# Patient Record
Sex: Male | Born: 1970
Health system: Southern US, Community
[De-identification: ages and names within clinical notes are randomized; demographics above are authoritative.]

## PROBLEM LIST (undated history)

## (undated) DIAGNOSIS — I10 Essential (primary) hypertension: Secondary | ICD-10-CM

## (undated) DIAGNOSIS — E7800 Pure hypercholesterolemia, unspecified: Secondary | ICD-10-CM

## (undated) DIAGNOSIS — G459 Transient cerebral ischemic attack, unspecified: Secondary | ICD-10-CM

## (undated) DIAGNOSIS — Z8616 Personal history of COVID-19: Secondary | ICD-10-CM

## (undated) DIAGNOSIS — I639 Cerebral infarction, unspecified: Secondary | ICD-10-CM

## (undated) HISTORY — PX: WISDOM TOOTH EXTRACTION: SHX21

## (undated) HISTORY — DX: Essential (primary) hypertension: I10

## (undated) HISTORY — PX: CARDIAC CATHETERIZATION: SHX172

## (undated) HISTORY — DX: Cerebral infarction, unspecified: I63.9

## (undated) HISTORY — PX: EYE SURGERY: SHX253

## (undated) HISTORY — DX: Transient cerebral ischemic attack, unspecified: G45.9

---

## 1998-08-11 ENCOUNTER — Ambulatory Visit (HOSPITAL_COMMUNITY): Admission: RE | Admit: 1998-08-11 | Discharge: 1998-08-11 | Payer: Self-pay | Admitting: Family Medicine

## 1998-08-11 ENCOUNTER — Encounter: Payer: Self-pay | Admitting: Family Medicine

## 2000-07-12 ENCOUNTER — Encounter: Admission: RE | Admit: 2000-07-12 | Discharge: 2000-10-10 | Payer: Self-pay | Admitting: Family Medicine

## 2004-04-06 ENCOUNTER — Ambulatory Visit (HOSPITAL_BASED_OUTPATIENT_CLINIC_OR_DEPARTMENT_OTHER): Admission: RE | Admit: 2004-04-06 | Discharge: 2004-04-06 | Payer: Self-pay | Admitting: Urology

## 2009-05-11 ENCOUNTER — Observation Stay (HOSPITAL_COMMUNITY): Admission: EM | Admit: 2009-05-11 | Discharge: 2009-05-13 | Payer: Self-pay | Admitting: Emergency Medicine

## 2009-05-11 ENCOUNTER — Encounter (INDEPENDENT_AMBULATORY_CARE_PROVIDER_SITE_OTHER): Payer: Self-pay | Admitting: Internal Medicine

## 2009-05-11 ENCOUNTER — Ambulatory Visit: Payer: Self-pay | Admitting: Internal Medicine

## 2010-05-29 LAB — CK TOTAL AND CKMB (NOT AT ARMC)
CK, MB: 0.5 ng/mL (ref 0.3–4.0)
CK, MB: 0.7 ng/mL (ref 0.3–4.0)
Relative Index: 0.6 (ref 0.0–2.5)
Total CK: 116 U/L (ref 7–232)
Total CK: 133 U/L (ref 7–232)
Total CK: 85 U/L (ref 7–232)

## 2010-05-29 LAB — CBC
HCT: 40.3 % (ref 39.0–52.0)
HCT: 42.5 % (ref 39.0–52.0)
HCT: 47.3 % (ref 39.0–52.0)
Hemoglobin: 13.9 g/dL (ref 13.0–17.0)
Hemoglobin: 14.6 g/dL (ref 13.0–17.0)
Hemoglobin: 16.1 g/dL (ref 13.0–17.0)
MCHC: 34 g/dL (ref 30.0–36.0)
MCHC: 34.1 g/dL (ref 30.0–36.0)
MCHC: 34.3 g/dL (ref 30.0–36.0)
MCHC: 34.5 g/dL (ref 30.0–36.0)
MCV: 89.2 fL (ref 78.0–100.0)
MCV: 89.7 fL (ref 78.0–100.0)
MCV: 89.8 fL (ref 78.0–100.0)
MCV: 90.1 fL (ref 78.0–100.0)
Platelets: 183 K/uL (ref 150–400)
Platelets: 194 10*3/uL (ref 150–400)
Platelets: 197 K/uL (ref 150–400)
Platelets: 222 10*3/uL (ref 150–400)
RBC: 4.49 MIL/uL (ref 4.22–5.81)
RBC: 4.76 MIL/uL (ref 4.22–5.81)
RBC: 5.27 MIL/uL (ref 4.22–5.81)
RDW: 13 % (ref 11.5–15.5)
RDW: 13 % (ref 11.5–15.5)
RDW: 13.5 % (ref 11.5–15.5)
RDW: 13.5 % (ref 11.5–15.5)
WBC: 5.3 K/uL (ref 4.0–10.5)
WBC: 6.9 K/uL (ref 4.0–10.5)
WBC: 9.8 10*3/uL (ref 4.0–10.5)

## 2010-05-29 LAB — POCT CARDIAC MARKERS
CKMB, poc: <1 ng/mL — ABNORMAL LOW (ref 1.0–8.0)
Myoglobin, poc: 105 ng/mL (ref 12–200)

## 2010-05-29 LAB — COMPREHENSIVE METABOLIC PANEL
ALT: 24 U/L (ref 0–53)
ALT: 38 U/L (ref 0–53)
AST: 18 U/L (ref 0–37)
Albumin: 3.1 g/dL — ABNORMAL LOW (ref 3.5–5.2)
Alkaline Phosphatase: 60 U/L (ref 39–117)
BUN: 13 mg/dL (ref 6–23)
CO2: 25 mEq/L (ref 19–32)
Calcium: 8.9 mg/dL (ref 8.4–10.5)
Creatinine, Ser: 1.07 mg/dL (ref 0.4–1.5)
GFR calc Af Amer: >60 mL/min (ref >60)
GFR calc non Af Amer: >60 mL/min (ref >60)
Glucose, Bld: 101 mg/dL — ABNORMAL HIGH (ref 70–99)
Glucose, Bld: 112 mg/dL — ABNORMAL HIGH (ref 70–99)
Potassium: 3.9 mEq/L (ref 3.5–5.1)
Sodium: 139 mEq/L (ref 135–145)
Total Protein: 6 g/dL (ref 6.0–8.3)

## 2010-05-29 LAB — POCT I-STAT, CHEM 8
BUN: 16 mg/dL (ref 6–23)
Calcium, Ion: 1.13 mmol/L (ref 1.12–1.32)
Chloride: 105 mEq/L (ref 96–112)
Creatinine, Ser: 1.1 mg/dL (ref 0.4–1.5)
Glucose, Bld: 132 mg/dL — ABNORMAL HIGH (ref 70–99)
HCT: 48 % (ref 39.0–52.0)
Hemoglobin: 16.3 g/dL (ref 13.0–17.0)
Potassium: 4.4 mEq/L (ref 3.5–5.1)
Sodium: 138 mEq/L (ref 135–145)
TCO2: 25 mmol/L (ref 0–100)

## 2010-05-29 LAB — DIFFERENTIAL
Basophils Absolute: 0 10*3/uL (ref 0.0–0.1)
Basophils Relative: 0 % (ref 0–1)
Basophils Relative: 0 % (ref 0–1)
Eosinophils Absolute: 0 10*3/uL (ref 0.0–0.7)
Eosinophils Absolute: 0 10*3/uL (ref 0.0–0.7)
Eosinophils Absolute: 0 10*3/uL (ref 0.0–0.7)
Eosinophils Relative: 0 % (ref 0–5)
Eosinophils Relative: 1 % (ref 0–5)
Lymphocytes Relative: 3 % — ABNORMAL LOW (ref 12–46)
Lymphs Abs: 0.3 10*3/uL — ABNORMAL LOW (ref 0.7–4.0)
Lymphs Abs: 0.6 10*3/uL — ABNORMAL LOW (ref 0.7–4.0)
Lymphs Abs: 0.9 10*3/uL (ref 0.7–4.0)
Monocytes Absolute: 0.4 10*3/uL (ref 0.1–1.0)
Monocytes Absolute: 0.6 10*3/uL (ref 0.1–1.0)
Monocytes Relative: 12 % (ref 3–12)
Monocytes Relative: 4 % (ref 3–12)
Neutro Abs: 9 10*3/uL — ABNORMAL HIGH (ref 1.7–7.7)
Neutrophils Relative %: 69 % (ref 43–77)
Neutrophils Relative %: 85 % — ABNORMAL HIGH (ref 43–77)
Neutrophils Relative %: 92 % — ABNORMAL HIGH (ref 43–77)

## 2010-05-29 LAB — LIPID PANEL
Cholesterol: 233 mg/dL — ABNORMAL HIGH (ref 0–200)
HDL: 35 mg/dL — ABNORMAL LOW (ref >39)
LDL Cholesterol: 191 mg/dL — ABNORMAL HIGH (ref 0–99)
Total CHOL/HDL Ratio: 6.7 ratio
Triglycerides: 36 mg/dL (ref <150)
VLDL: 7 mg/dL (ref 0–40)

## 2010-05-29 LAB — BASIC METABOLIC PANEL WITH GFR
BUN: 8 mg/dL (ref 6–23)
CO2: 27 meq/L (ref 19–32)
Calcium: 9 mg/dL (ref 8.4–10.5)
Chloride: 105 meq/L (ref 96–112)
Creatinine, Ser: 0.97 mg/dL (ref 0.4–1.5)
GFR calc Af Amer: >60 mL/min (ref >60)
GFR calc non Af Amer: >60 mL/min (ref >60)
Glucose, Bld: 116 mg/dL — ABNORMAL HIGH (ref 70–99)
Potassium: 4.3 meq/L (ref 3.5–5.1)
Sodium: 139 meq/L (ref 135–145)

## 2010-05-29 LAB — STOOL CULTURE

## 2010-05-29 LAB — HEPATIC FUNCTION PANEL
ALT: 47 U/L (ref 0–53)
AST: 25 U/L (ref 0–37)
Albumin: 3.9 g/dL (ref 3.5–5.2)

## 2010-05-29 LAB — PHOSPHORUS: Phosphorus: 3.7 mg/dL (ref 2.3–4.6)

## 2010-05-29 LAB — CULTURE, BLOOD (ROUTINE X 2): Culture: NO GROWTH

## 2010-05-29 LAB — TSH: TSH: 0.273 u[IU]/mL — ABNORMAL LOW (ref 0.350–4.500)

## 2010-05-29 LAB — TROPONIN I
Troponin I: 3.74 ng/mL (ref 0.00–0.06)
Troponin I: 3.77 ng/mL (ref 0.00–0.06)
Troponin I: 3.9 ng/mL (ref 0.00–0.06)
Troponin I: 4.01 ng/mL (ref 0.00–0.06)

## 2010-05-29 LAB — MAGNESIUM: Magnesium: 1.8 mg/dL (ref 1.5–2.5)

## 2010-05-29 LAB — HEPARIN LEVEL (UNFRACTIONATED)
Heparin Unfractionated: 0.16 [IU]/mL — ABNORMAL LOW (ref 0.30–0.70)
Heparin Unfractionated: 0.28 [IU]/mL — ABNORMAL LOW (ref 0.30–0.70)
Heparin Unfractionated: 0.46 [IU]/mL (ref 0.30–0.70)
Heparin Unfractionated: <0.1 [IU]/mL — ABNORMAL LOW (ref 0.30–0.70)

## 2010-05-29 LAB — PROTIME-INR
INR: 1.06 (ref 0.00–1.49)
Prothrombin Time: 13.7 s (ref 11.6–15.2)

## 2010-05-29 LAB — GIARDIA/CRYPTOSPORIDIUM SCREEN(EIA)
Cryptosporidium Screen (EIA): NEGATIVE
Giardia Screen - EIA: NEGATIVE

## 2010-05-29 LAB — D-DIMER, QUANTITATIVE: D-Dimer, Quant: 0.5 ug/mL-FEU — ABNORMAL HIGH (ref 0.00–0.48)

## 2010-05-29 LAB — HEMOGLOBIN A1C: Hgb A1c MFr Bld: 5.6 % (ref 4.6–6.1)

## 2010-07-18 NOTE — Assessment & Plan Note (Signed)
[facility]                                 ON-CALL NOTE   NAME:Brad Holmes, Brad Holmes                         MRN:          [id number]  DATE:05/11/2009                            DOB:          21-Jan-1971    TIME:  5:30 a.m.   CHIEF COMPLAINT:  Nausea, vomiting, and chest pain/positive troponin.   PRIMARY CARDIOLOGIST PRIOR:  Meade Maw, MD   HISTORY OF PRESENT ILLNESS:  This patient is a 40 year old male with a  history of hyperlipidemia and a strong family history who presents to  [facility] with chief complaint of nausea, vomiting, diarrhea,  and some associated chest pain.  He notes he has been having  approximately 12 hours of these symptoms from about 3 p.m.  This  afternoon, he began to have a sudden onset of sharp midsternal chest  pain with associated shortness of breath and occasional radiation to his  arm.  Diagnoses were largely episodic, lasting seconds to minutes but  then these were worse with individual deep breaths.  He had no prior  prodrome of exertional chest pain.  Denies any recent symptoms of  congestive heart failure including PND, orthopnea, or dyspnea on  exertion.  He has otherwise been in his usual baseline state of health.  He does note he travels significantly for work and does have a strong  family history for early coronary artery disease.  Based on some EKG  abnormalities, previously he had a nuclear stress test back in 2005.   PAST MEDICAL HISTORY:  Notable for hyperlipidemia, negative nuclear  stress test by Dr. Fraser Din in 2005.   SOCIAL HISTORY:  No tobacco, alcohol, or drug use.  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   FAMILY HISTORY:  Notable for early coronary artery disease.  Father had  a first MI at age 41 and died at age 37.  He has an uncle and brother,  all of them with MIs in their early 109s.   REVIEW OF SYMPTOMS:  As per HPI, full 14-point review of systems is  negative except as noted above in the HPI.   MEDICATIONS:  At home include;  1. Aspirin 81 mg daily.  2. Crestor 40 mg p.o. daily.   ALLERGIES:  No known drug allergies.   PHYSICAL EXAMINATION:  VITAL SIGNS:  In the emergency department were  temperature 97.3, blood pressure 115/79, pulse 103, respiratory rate 22,  sats 100% on room air.  GENERAL:  A well-appearing white male in no acute distress.  HEENT:  Normocephalic, atraumatic.  Normal mucous membranes.  Pupils are  equal, round, reactive to light and accommodation.  Extraocular  movements are intact.  Oropharynx clear.  Mucous membranes are moist.  NECK:  Supple.  No evidence of JVD.  No carotid bruits.  LUNGS:  Clear to auscultation bilaterally.  HEART:  Regular rate and rhythm.  No murmurs, rubs, or gallops.  ABDOMEN:  Soft, nontender, nondistended.  Positive bowel sounds  throughout.  EXTREMITIES:  Without clubbing, cyanosis, or edema; 2+ peripheral  pulses  including dorsalis pedis and posterior tibial, and 2+ radial and femoral  pulses as well.  NEUROLOGIC:  Cranial nerves II-XII are intact.  Strength is 5/5 x4  extremities.  SKIN:  Warm, dry without rash.   LABORATORY DATA:  Laboratory data was reviewed extensively.  CMP reveals  sodium 139, potassium 4.2, chloride 106, bicarb 25, glucose 112,  creatinine is normal at 1.07, BUN is 13.  He has got normal liver  function tests.  CBC reveals a white count of 7.3, hemoglobin of 14.9,  platelet count of 194.  He has got normal differential.  Serial cardiac  markers reveal an initial set at 20:58 on the 8th with CK-MB of less  than 1.0, troponin of less than 0.05, and myoglobin of 105.  Second set  at 3:58 a.m. on March 9 reveals a total CK of 133, MB absolute of 1.0,  and a relative index of 0.8 which are negative; however, he had a  positive troponin at 3.77.  D-dimer checked at the same time revealed  elevation at 0.5.  The EKGs were reviewed.  Initial EKG on the 8th   reveals normal sinus rhythm at a rate of 92 with a normal R-wave  progression.  No evidence of ST or T-wave changes.  Subsequent EKG on  the 9th reveals poor R-wave progression and a nonspecific ST or T-wave  change including depression in the lateral leads and that is not  consistent with ischemia, may reflect lead placement.  His stress test  from several years goes unavailable for my personal review.  Chest x-ray  was reviewed.  Heart size is normal.  No evidence of infiltrate.  Lungs  are clear.   ASSESSMENT/PLAN:  A 40 year old white male with a history of  hyperlipidemia and early family history with nausea, vomiting, and  atypical-type chest pain presenting with a normal CK profile but  elevated troponin, concerning for ACS versus noncardiac source of his  pain.  1. Agree with primary team's plan to continue aspirin, statin, and      plan for a CTA of the chest.  Certainly, a large pulmonary embolus      would fit his historical profile and could cause isolated troponin      elevation.  Certainly, plan to repeat his cardiac enzymes including      CK and troponin.  Continue to monitor him on telemetry.  2. Optimize medical management including addition of low-dose beta-      blocker available, may be limited by blood pressure and pulse.      Continue with his statin as well as an aspirin.  .   Based on the results of his chest CTA and repeat cardiac enzymes, we  will decide at that time whether to plan for repeat perfusion imaging  versus angiography to further define his coronary anatomy.      Vinnie Level, MD    PMB/MedQ  DD: 05/11/2009  DT: 05/12/2009  Job #: [id number]

## 2010-07-21 NOTE — Op Note (Signed)
NAME:  Brad Holmes, DHALIWAL NO.:  [id number]   MEDICAL RECORD NO.:  [id number]          PATIENT TYPE:  AMB   LOCATION:  [facility]                         FACILITY:  [facility]   PHYSICIAN:  Excell Seltzer. Annabell Howells, M.D.    DATE OF BIRTH:  February 18, 1971   DATE OF PROCEDURE:  04/06/2004  DATE OF DISCHARGE:                                 OPERATIVE REPORT   PROCEDURE:  Visual internal urethrotomy.   PREOPERATIVE DIAGNOSIS:  Proximal urethral stricture.   POSTOPERATIVE DIAGNOSIS:  Proximal urethral stricture.   SURGEON:  Bjorn Pippin, M.D.   ANESTHESIA:  General.   DRAINS:  Foley.   COMPLICATIONS:  None.   INDICATIONS:  Tammy Sours is a 40 year old white male who presented with voiding  complaints and was found to have a bulbar urethral stricture. He is to  undergo a visual internal urethrotomy.   FINDINGS OF THE PROCEDURE:  The patient was taken to the operating room  where a general anesthetic was induced. He was given antibiotics  preoperatively. He was placed in the lithotomy position. His perineum and  genitalia were prepped with Betadine solution. He was draped in the usual  sterile fashion. The urethral meatus was dilated to 30-French with Heyman  sounds and a 26-French continuous flow urethrotome was then passed. It was  rather snug at the meatus. Once in position, the stricture was visualized in  the bulbar urethra with a 0-degree lens, and an initial attempt with a half-  moon knife was made to incise the stricture. This would not fit through the  lumen, so a straight blade was then used to initiate the urethrotomy. I then  passed a guidewire through the scope into the bladder and then once again  replaced the half-moon knife and incised the stricture until I could get the  scope through. The stricture was approximately 2 cm distal to the sphincter.  Proximal to the stricture were some areas of whitish fibrous debris that was  densely adherent to the walls of the urethra and appeared  to be some sort of  inflammatory plaque. The scope was advanced into the bladder, inspection  revealed moderate trabeculation, no tumors or stones were noted. Ureteral  orifices were unremarkable. The stricture was then opened at the 6 o'clock  position until a normal-appearing tissue was apparent throughout its length.  The urethrotome was then removed and an 18-French Foley catheter was placed  along side the wire. Once the catheter was in the bladder, the balloon was  filled with 10 mL of sterile fluid, the wire was removed, the catheter was  irrigated. There was initially some blood, but cleared quickly. A little bit  of blood initially appeared along side of the catheter as well, but that  also cleared  quickly. At this point, the catheter was placed to leg bag drainage, the  patient was taken down from lithotomy position, his anesthetic was reversed,  he was moved to the recovery room in stable condition. There were no  complications.      JJW/MEDQ  D:  04/06/2004  T:  04/06/2004  Job:  [id number]   cc:   Carola J. Gerri Spore, M.D.  [address]  [place]  [place] [address]  Fax: [phone]

## 2013-11-25 ENCOUNTER — Other Ambulatory Visit: Payer: Self-pay | Admitting: Family Medicine

## 2013-11-25 DIAGNOSIS — R1084 Generalized abdominal pain: Secondary | ICD-10-CM

## 2013-11-27 ENCOUNTER — Ambulatory Visit
Admission: RE | Admit: 2013-11-27 | Discharge: 2013-11-27 | Disposition: A | Discharge status: Home / Self Care | Payer: No Typology Code available for payment source | Source: Ambulatory Visit | Attending: Family Medicine | Admitting: Family Medicine

## 2013-11-27 ENCOUNTER — Other Ambulatory Visit: Payer: Self-pay | Admitting: Family Medicine

## 2013-11-27 DIAGNOSIS — R1084 Generalized abdominal pain: Secondary | ICD-10-CM

## 2013-12-04 ENCOUNTER — Other Ambulatory Visit: Payer: Self-pay

## 2015-03-06 HISTORY — PX: EYE SURGERY: SHX253

## 2015-06-01 ENCOUNTER — Emergency Department (HOSPITAL_COMMUNITY): Payer: No Typology Code available for payment source

## 2015-06-01 ENCOUNTER — Emergency Department (HOSPITAL_COMMUNITY)
Admission: EM | Admit: 2015-06-01 | Discharge: 2015-06-01 | Disposition: A | Discharge status: Home / Self Care | Payer: No Typology Code available for payment source | Attending: Emergency Medicine | Admitting: Emergency Medicine

## 2015-06-01 ENCOUNTER — Encounter (HOSPITAL_COMMUNITY): Payer: Self-pay | Admitting: Emergency Medicine

## 2015-06-01 DIAGNOSIS — R61 Generalized hyperhidrosis: Secondary | ICD-10-CM | POA: Diagnosis not present

## 2015-06-01 DIAGNOSIS — R0602 Shortness of breath: Secondary | ICD-10-CM | POA: Diagnosis not present

## 2015-06-01 DIAGNOSIS — R079 Chest pain, unspecified: Secondary | ICD-10-CM | POA: Insufficient documentation

## 2015-06-01 DIAGNOSIS — R1110 Vomiting, unspecified: Secondary | ICD-10-CM | POA: Diagnosis not present

## 2015-06-01 HISTORY — DX: Pure hypercholesterolemia, unspecified: E78.00

## 2015-06-01 LAB — BASIC METABOLIC PANEL
Anion gap: 12 (ref 5–15)
BUN: 12 mg/dL (ref 6–20)
CALCIUM: 9.7 mg/dL (ref 8.9–10.3)
CHLORIDE: 103 mmol/L (ref 101–111)
CO2: 24 mmol/L (ref 22–32)
CREATININE: 1.08 mg/dL (ref 0.61–1.24)
GFR calc non Af Amer: >60 mL/min (ref >60)
GLUCOSE: 125 mg/dL — AB (ref 65–99)
Potassium: 4 mmol/L (ref 3.5–5.1)
Sodium: 139 mmol/L (ref 135–145)

## 2015-06-01 LAB — CBC
HCT: 46.7 % (ref 39.0–52.0)
Hemoglobin: 16.3 g/dL (ref 13.0–17.0)
MCH: 30.7 pg (ref 26.0–34.0)
MCHC: 34.9 g/dL (ref 30.0–36.0)
MCV: 87.9 fL (ref 78.0–100.0)
PLATELETS: 264 10*3/uL (ref 150–400)
RBC: 5.31 MIL/uL (ref 4.22–5.81)
RDW: 13.4 % (ref 11.5–15.5)
WBC: 14.8 10*3/uL — ABNORMAL HIGH (ref 4.0–10.5)

## 2015-06-01 LAB — I-STAT TROPONIN, ED: TROPONIN I, POC: 0 ng/mL (ref 0.00–0.08)

## 2015-06-01 MED ORDER — ONDANSETRON 4 MG PO TBDP
8.0000 mg | ORAL_TABLET | Freq: Once | ORAL | Status: AC
  Administered 2015-06-01: 8 mg via ORAL

## 2015-06-01 MED ORDER — ONDANSETRON 4 MG PO TBDP
ORAL_TABLET | ORAL | Status: AC
  Filled 2015-06-01: qty 1

## 2015-06-01 NOTE — ED Notes (Signed)
Pt. reports central chest pain onset this evening with emesis , SOB and diaphoresis .

## 2015-06-01 NOTE — ED Notes (Addendum)
Pt stated that he is feeling better and wishes to leave. Advised patient to return immediately if his symptoms returned. Pt ambulated out of the waiting area.

## 2016-04-26 ENCOUNTER — Ambulatory Visit (INDEPENDENT_AMBULATORY_CARE_PROVIDER_SITE_OTHER): Payer: 59 | Admitting: Podiatry

## 2016-04-26 VITALS — Resp 16 | Ht 71.0 in | Wt 197.0 lb

## 2016-04-26 DIAGNOSIS — B351 Tinea unguium: Secondary | ICD-10-CM

## 2016-04-26 LAB — HEPATIC FUNCTION PANEL
ALBUMIN: 4.3 g/dL (ref 3.6–5.1)
ALK PHOS: 72 U/L (ref 40–115)
ALT: 41 U/L (ref 9–46)
AST: 19 U/L (ref 10–40)
BILIRUBIN DIRECT: 0.1 mg/dL (ref <=0.2)
BILIRUBIN TOTAL: 0.5 mg/dL (ref 0.2–1.2)
Indirect Bilirubin: 0.4 mg/dL (ref 0.2–1.2)
Total Protein: 7.1 g/dL (ref 6.1–8.1)

## 2016-04-26 MED ORDER — TERBINAFINE HCL 250 MG PO TABS: 250.0000 mg | ORAL_TABLET | Freq: Every day | ORAL | 0 refills | Status: DC

## 2016-04-26 NOTE — Progress Notes (Signed)
   Subjective:    Patient ID: Brad Holmes, male    DOB: [birth year], 46 y.o.   MRN: [id number]  HPI  Chief Complaint  Patient presents with  . Nail Problem    Right; Great toe; Discolored/Yellow x 1 year. Pt stated that he injured the toe nearly 1 year ago and the nail grew back abnormal and yellow. Pt's PCP prescribed Lamisil and he did not take the medication due to side effects.        Review of Systems     Objective:   Physical Exam        Assessment & Plan:

## 2016-04-27 NOTE — Progress Notes (Signed)
Subjective:     Patient ID: Brad Holmes, male   DOB: 03-28-1970, 46 y.o.   MRN: [id number]  HPI patient presents with damaged right hallux nail that's thickened and incurvated and makes it hard for him to cut. Thinks that he traumatized and a year ago but he does have history of fungus   Review of Systems  All other systems reviewed and are negative.      Objective:   Physical Exam  Constitutional: He is oriented to person, place, and time.  Cardiovascular: Intact distal pulses.   Musculoskeletal: Normal range of motion.  Neurological: He is oriented to person, place, and time.  Skin: Skin is warm.  Nursing note and vitals reviewed.  neurovascular status intact muscle strength was adequate range of motion within normal limits with patient found to have a yellow right hallux nail and slight discoloration of adjacent nails. It is localized with no other pathology within the nails but does have some skin irritation indicating probable fungal infection of a light nature. Patient has good digital perfusion and is well oriented 3     Assessment:     I believe a combination of mycotic infection with probable trauma along with probable systemic element to the condition    Plan:     H&P conditions reviewed and at this point we'll start with oral Lamisil which I educated him on and he will get liver function study along with laser therapy and topical medication. Patient be seen back for laser

## 2016-04-30 ENCOUNTER — Ambulatory Visit (INDEPENDENT_AMBULATORY_CARE_PROVIDER_SITE_OTHER): Payer: Self-pay

## 2016-04-30 DIAGNOSIS — B351 Tinea unguium: Secondary | ICD-10-CM

## 2016-05-04 NOTE — Progress Notes (Signed)
Pt presents with mycotic infection of nail Rt hallux All other systems are negative  Laser therapy administered to affected nails and tolerated well. All safety precautions were in place. Re-appointed in 1 month for 2nd of 3 treatments

## 2016-05-28 ENCOUNTER — Other Ambulatory Visit: Payer: 59

## 2016-06-05 ENCOUNTER — Ambulatory Visit (INDEPENDENT_AMBULATORY_CARE_PROVIDER_SITE_OTHER): Payer: 59 | Admitting: Podiatry

## 2016-06-05 DIAGNOSIS — B351 Tinea unguium: Secondary | ICD-10-CM

## 2016-06-07 NOTE — Progress Notes (Signed)
Pt presents with mycotic infection of nail Rt hallux All other systems are negative  Laser therapy administered to affected nails and tolerated well. All safety precautions were in place. Re-appointed prn

## 2016-07-09 ENCOUNTER — Other Ambulatory Visit: Payer: Self-pay

## 2016-07-13 ENCOUNTER — Other Ambulatory Visit: Payer: Self-pay

## 2016-08-27 ENCOUNTER — Ambulatory Visit (INDEPENDENT_AMBULATORY_CARE_PROVIDER_SITE_OTHER): Payer: Self-pay

## 2016-08-27 ENCOUNTER — Ambulatory Visit (INDEPENDENT_AMBULATORY_CARE_PROVIDER_SITE_OTHER): Payer: Self-pay | Admitting: Physician Assistant

## 2016-08-27 DIAGNOSIS — S82892A Other fracture of left lower leg, initial encounter for closed fracture: Secondary | ICD-10-CM

## 2016-08-27 MED ORDER — OXYCODONE-ACETAMINOPHEN 5-325 MG PO TABS: 1.0000 | ORAL_TABLET | Freq: Four times a day (QID) | ORAL | 0 refills | Status: DC | PRN

## 2016-08-27 NOTE — Progress Notes (Signed)
Office Visit Note   Patient: Brad Holmes           Date of Birth: 26-Nov-1970           MRN: [id number] Visit Date: 08/27/2016              Requested by: No referring provider defined for this encounter. PCP: No primary care provider on file.   Assessment & Plan: Visit Diagnoses:  1. Closed fracture of left ankle, initial encounter     Plan: He is given a prescription for Percocet. Would like for him to take him Tylenol for pain in the day as much as he can. Discussed with him that he take no more than 3g Tylenol day. No NSAIDs at this point in time recommend ice to the ankle. Place him on aspirin 325 mg once daily. He is touchdown weightbearing on the ankle in the [employer]. He can come out of boot boot for hygiene and comfort while sitting. Elevation wiggling toes encouraged. Follow with [place] in 2 weeks for 3 views of the ankle.  Follow-Up Instructions: Return in about 2 weeks (around 09/10/2016).   Orders:  Orders Placed This Encounter  Procedures  . XR Ankle Complete Left  . XR Tibia/Fibula Left   Meds ordered this encounter  Medications  . oxyCODONE-acetaminophen (PERCOCET/ROXICET) 5-325 MG tablet    Sig: Take 1-2 tablets by mouth every 6 (six) hours as needed for severe pain.    Dispense:  40 tablet    Refill:  0      Procedures: No procedures performed   Clinical Data: No additional findings.   Subjective: Chief Complaint  Patient presents with  . Left Ankle - Injury    HPI  46 year old male who is seen for a left ankle fracture. He was on vacation and jumped off of a boat onto a pilon sustaining an injury to the left ankle. He saw an orthopedist in [place] and was told he had a Weber B fracture. Fracture is now 1 week out. He's been in a [place] dressing splint. He did not bring any films with him today. Does travel a lot for work and is due to travel to [place], has some concerns about possible blood clot. He does not smoke. He's had no history of  previous clot. Denies chest pain shortness breath fevers chills.  Review of Systems Please see history of present illness otherwise negative  Objective: Vital Signs: There were no vitals taken for this visit.  Physical Exam  Constitutional: He is oriented to person, place, and time. He appears well-developed and well-nourished. No distress.  Cardiovascular: Intact distal pulses.   Pulmonary/Chest: Effort normal.  Neurological: He is alert and oriented to person, place, and time.  Skin: He is not diaphoretic.  Psychiatric: He has a normal mood and affect. His behavior is normal.    Ortho Exam Left foot has swelling globally about the ankle and foot. Significant ecchymosis lateral aspect of the ankle and down into the mid foot region. Tenderness over the lateral malleolus. Slight tenderness over the deltoid ligament. Left calf supple nontender. Tenderness over the proximal fibula. Specialty Comments:  No specialty comments available.  Imaging: Xr Ankle Complete Left  Result Date: 08/27/2016 Left ankle 3 views: Talus well located within the ankle mortise. Comminuted minimally displaced lateral malleolus fracture Weber B type. No other fractures identified.  Xr Tibia/fibula Left  Result Date: 08/27/2016 AP lateral views proximal tibia: No acute fracture proximally. The knee  joint well maintained.    PMFS History: Patient Active Problem List   Diagnosis Date Noted  . Closed fracture of left ankle 08/27/2016   Past Medical History:  Diagnosis Date  . Hypercholesterolemia     No family history on file.  No past surgical history on file. Social History   Occupational History  . Not on file.   Social History Main Topics  . Smoking status: Never Smoker  . Smokeless tobacco: Not on file  . Alcohol use No  . Drug use: No  . Sexual activity: Not on file

## 2016-09-12 ENCOUNTER — Ambulatory Visit (INDEPENDENT_AMBULATORY_CARE_PROVIDER_SITE_OTHER): Payer: Self-pay | Admitting: Physician Assistant

## 2016-09-12 ENCOUNTER — Ambulatory Visit (INDEPENDENT_AMBULATORY_CARE_PROVIDER_SITE_OTHER): Payer: Self-pay

## 2016-09-12 DIAGNOSIS — S82892D Other fracture of left lower leg, subsequent encounter for closed fracture with routine healing: Secondary | ICD-10-CM

## 2016-09-12 MED ORDER — DICLOFENAC SODIUM 1 % TD GEL
2.0000 g | Freq: Three times a day (TID) | TRANSDERMAL | 0 refills | Status: DC
Start: 2016-09-12 — End: 2018-08-11

## 2016-09-12 NOTE — Progress Notes (Signed)
   Office Visit Note   Patient: Brad Holmes           Date of Birth: Oct 29, 1970           MRN: [id number] Visit Date: 09/12/2016              Requested by: No referring provider defined for this encounter. PCP: No primary care provider on file.   Assessment & Plan: Visit Diagnoses:  1. Closed fracture of left ankle with routine healing, subsequent encounter     Plan: Brad Holmes will remain in a cam walker boot for the next 2 weeks. He is weightbearing as tolerated in a cam walker boot. He'll then transition to an ASO brace to wear this at all times for 1 week and then will wear it when out of the house for a week and wean out of the brace. Achilles stretching exercises shown today. He will begin using Voltaren gel 3 times daily in the quantity of 2 g to the Achilles.9 /16 of an inch heel lift in his CAM Walker boot/ left shoe Follow-up with [place] in 1 month no x-rays at that time unless clinically indicated.  Follow-Up Instructions: Return in about 4 weeks (around 10/10/2016).   Orders:  Orders Placed This Encounter  Procedures  . XR Ankle Complete Left   Meds ordered this encounter  Medications  . diclofenac sodium (VOLTAREN) 1 % GEL    Sig: Apply 2 g topically 3 (three) times daily.    Dispense:  3 Tube    Refill:  0      Procedures: No procedures performed   Clinical Data: No additional findings.   Subjective: Left ankle fracture  HPI Brad Holmes returns today follow-up of his left ankle lateral malleolus fracture. He is now 5 weeks status post injury. He's developed some tenderness in his Achilles since he was last seen. He is having no Pain chest pain or shortness breath. Review of Systems No chest pain shortness breath. Positive for lateral left ankle pain and left Achilles pain  Objective: Vital Signs: There were no vitals taken for this visit.  Physical Exam  Ortho Exam Left ankle slight edema. Ecchymosis posterior calcaneal area. Tenderness over the lateral  malleolus only. Tenderness also over the Achilles Thompson test negative. Calf supple nontender. 5 out of 5 strength with inversion /eversion against resistance. Nontender over the posterior tibial tendon and peroneal tendons.  Specialty Comments:  No specialty comments available.  Imaging: Xr Ankle Complete Left  Result Date: 09/12/2016 3 views left ankle: Talus well located within the ankle mortise no diastases. The Weber B type lateral malleolus fracture remains in overall good position alignment. Early signs of callus formation.    PMFS History: Patient Active Problem List   Diagnosis Date Noted  . Closed fracture of left ankle 08/27/2016   Past Medical History:  Diagnosis Date  . Hypercholesterolemia     No family history on file.  No past surgical history on file. Social History   Occupational History  . Not on file.   Social History Main Topics  . Smoking status: Never Smoker  . Smokeless tobacco: Not on file  . Alcohol use No  . Drug use: No  . Sexual activity: Not on file

## 2016-10-09 ENCOUNTER — Ambulatory Visit (INDEPENDENT_AMBULATORY_CARE_PROVIDER_SITE_OTHER): Payer: Self-pay | Admitting: Physician Assistant

## 2016-10-09 ENCOUNTER — Ambulatory Visit (INDEPENDENT_AMBULATORY_CARE_PROVIDER_SITE_OTHER): Payer: Self-pay

## 2016-10-09 DIAGNOSIS — G8929 Other chronic pain: Secondary | ICD-10-CM | POA: Insufficient documentation

## 2016-10-09 DIAGNOSIS — M25572 Pain in left ankle and joints of left foot: Secondary | ICD-10-CM

## 2016-10-09 NOTE — Progress Notes (Signed)
   Office Visit Note   Patient: Brad Holmes           Date of Birth: [birth year]           MRN: [id number] Visit Date: 10/09/2016              Requested by: No referring provider defined for this encounter. PCP: Brad Pih, MD   Assessment & Plan: Visit Diagnoses:  1. Pain in left ankle and joints of left foot     Plan: He will discontinue the ASO brace. I advised him to wear a high shoe to help protect his ankle. Something like a boot or high top sneaker. He is given a prescription for physical therapy to work on range of motion strengthening ankle. See him back in 4 weeks to check his progress. Have discussed with him that he has soreness and swelling in the ankle for up to 6 months to a year.  Follow-Up Instructions: Return in about 4 weeks (around 11/06/2016).   Orders:  Orders Placed This Encounter  Procedures  . XR Ankle Complete Left   No orders of the defined types were placed in this encounter.     Procedures: No procedures performed   Clinical Data: No additional findings.   Subjective: No chief complaint on file.   HPI Brad Holmes returns today for follow-up of his left ankle lateral malleolus fracture. States overall is doing where well. He wears the ASO at times but finds that this actually makes pain worse. He does have swelling from time to time. He has essentially generalized tenderness and stiffness in the ankle that he feels consistent with tendinitis. He states the ankle feels like it needs to pop. He is having no true mechanical symptoms of the ankle. Review of Systems   Objective: Vital Signs: There were no vitals taken for this visit.  Physical Exam  Constitutional: He is oriented to person, place, and time. He appears well-developed and well-nourished. No distress.  Cardiovascular: Intact distal pulses.   Neurological: He is alert and oriented to person, place, and time.  Psychiatric: He has a normal mood and affect. His behavior is  normal.    Ortho Exam at the ankle he has good plantarflexion and dorsiflex neutral. As stiffness with inversion eversion but 5 out of 5 strengths against resistance with inversion eversion. Tenderness over the Achilles tendon. Achilles intact. Slight tenderness over the posterior tibial tendon and peroneal tendon near the brevis insertion. Mild tenderness over the lateral malleolus. No tenderness over the deltoid ligament.  Specialty Comments:  No specialty comments available.  Imaging: Xr Ankle Complete Left  Result Date: 10/09/2016 Left ankle 3 views. Further consolidation of the lateral malleolus fracture. No change in overall position alignment. Talus remains well located within the ankle mortise without diastases.    PMFS History: Patient Active Problem List   Diagnosis Date Noted  . Pain in left ankle and joints of left foot 10/09/2016  . Closed fracture of left ankle 08/27/2016   Past Medical History:  Diagnosis Date  . Hypercholesterolemia     No family history on file.  No past surgical history on file. Social History   Occupational History  . Not on file.   Social History Main Topics  . Smoking status: Never Smoker  . Smokeless tobacco: Not on file  . Alcohol use No  . Drug use: No  . Sexual activity: Not on file

## 2016-11-12 ENCOUNTER — Ambulatory Visit (INDEPENDENT_AMBULATORY_CARE_PROVIDER_SITE_OTHER): Payer: Self-pay | Admitting: Physician Assistant

## 2018-02-04 ENCOUNTER — Ambulatory Visit: Payer: Self-pay | Admitting: Family Medicine

## 2018-02-04 DIAGNOSIS — Z23 Encounter for immunization: Secondary | ICD-10-CM

## 2018-02-04 NOTE — Progress Notes (Signed)
Pt presents here today for visit to receive influenza vaccine. Allergies reviewed, vaccine given, vaccine information statement provided, tolerated well.

## 2018-02-04 NOTE — Patient Instructions (Signed)
VIS FORM WAS GIVEN LEFT ARM.

## 2018-02-10 ENCOUNTER — Ambulatory Visit (INDEPENDENT_AMBULATORY_CARE_PROVIDER_SITE_OTHER): Payer: 59 | Admitting: Podiatry

## 2018-02-10 ENCOUNTER — Encounter: Payer: Self-pay | Admitting: Podiatry

## 2018-02-10 DIAGNOSIS — B351 Tinea unguium: Secondary | ICD-10-CM

## 2018-02-10 DIAGNOSIS — L821 Other seborrheic keratosis: Secondary | ICD-10-CM | POA: Diagnosis not present

## 2018-02-10 DIAGNOSIS — D225 Melanocytic nevi of trunk: Secondary | ICD-10-CM | POA: Diagnosis not present

## 2018-02-10 DIAGNOSIS — A630 Anogenital (venereal) warts: Secondary | ICD-10-CM | POA: Diagnosis not present

## 2018-02-10 DIAGNOSIS — L814 Other melanin hyperpigmentation: Secondary | ICD-10-CM | POA: Diagnosis not present

## 2018-02-10 LAB — HEPATIC FUNCTION PANEL
AG Ratio: 1.8 (calc) (ref 1.0–2.5)
ALKALINE PHOSPHATASE (APISO): 76 U/L (ref 40–115)
ALT: 35 U/L (ref 9–46)
AST: 21 U/L (ref 10–40)
Albumin: 4.4 g/dL (ref 3.6–5.1)
Bilirubin, Direct: 0.1 mg/dL (ref 0.0–0.2)
Globulin: 2.5 g/dL (calc) (ref 1.9–3.7)
Indirect Bilirubin: 0.4 mg/dL (calc) (ref 0.2–1.2)
Total Bilirubin: 0.5 mg/dL (ref 0.2–1.2)
Total Protein: 6.9 g/dL (ref 6.1–8.1)

## 2018-02-10 MED ORDER — TERBINAFINE HCL 250 MG PO TABS: 250.0000 mg | ORAL_TABLET | Freq: Every day | ORAL | 0 refills | Status: DC

## 2018-02-12 NOTE — Progress Notes (Signed)
Subjective:   Patient ID: Brad Holmes, male   DOB: 47 y.o.   MRN: [id number]   HPI Patient presents stating his had discoloration of his big toenails bilateral and that he had laser several years ago which was relatively effective and he has all nails that are thickened currently and irritation also between his toes.  Patient does not smoke likes to be active   Review of Systems  All other systems reviewed and are negative.       Objective:  Physical Exam  Constitutional: He appears well-developed and well-nourished.  Cardiovascular: Intact distal pulses.  Pulmonary/Chest: Effort normal.  Musculoskeletal: Normal range of motion.  Neurological: He is alert.  Skin: Skin is warm.  Nursing note and vitals reviewed.   Neurovascular status intact muscle strength is adequate range of motion was within normal limits with change in color to the nailbeds with moderate thickness noted of the beds localized in nature with patient having also interdigital irritation between toes 2334 bilateral.  Has good digital perfusion well oriented x3     Assessment:  Mycotic nail infection mostly affecting the hallux bilateral with slight involvement of the other nails with also interdigital fungal infection     Plan:  H&P conditions reviewed discussed treatment options he would like to try to get rid of this.  I explained it may not ever completely get rid of it for you to try oral Lamisil and we will get liver function studies and laser therapy x3 or 4 treatments.  Patient is scheduled with Janett Billow for laser and will begin Lamisil 250 mg daily after I reviewed blood work.  Patient is encouraged to call with any questions concerns

## 2018-02-13 ENCOUNTER — Ambulatory Visit: Payer: Self-pay

## 2018-02-13 ENCOUNTER — Other Ambulatory Visit: Payer: 59

## 2018-02-13 DIAGNOSIS — B351 Tinea unguium: Secondary | ICD-10-CM

## 2018-02-13 NOTE — Progress Notes (Signed)
Pt presents with mycotic infection of nails 1-5 bilateral.  All other systems are negative  Laser therapy administered to affected nails and tolerated well. All safety precautions were in place.  2nd treatment.  Follow up in 4 weeks     

## 2018-02-14 ENCOUNTER — Emergency Department (HOSPITAL_BASED_OUTPATIENT_CLINIC_OR_DEPARTMENT_OTHER)
Admission: EM | Admit: 2018-02-14 | Discharge: 2018-02-15 | Disposition: A | Discharge status: Home / Self Care | Payer: 59 | Attending: Emergency Medicine | Admitting: Emergency Medicine

## 2018-02-14 ENCOUNTER — Other Ambulatory Visit: Payer: Self-pay

## 2018-02-14 ENCOUNTER — Emergency Department (HOSPITAL_BASED_OUTPATIENT_CLINIC_OR_DEPARTMENT_OTHER): Payer: 59

## 2018-02-14 DIAGNOSIS — S3992XA Unspecified injury of lower back, initial encounter: Secondary | ICD-10-CM | POA: Diagnosis not present

## 2018-02-14 DIAGNOSIS — W19XXXA Unspecified fall, initial encounter: Secondary | ICD-10-CM

## 2018-02-14 DIAGNOSIS — M542 Cervicalgia: Secondary | ICD-10-CM | POA: Diagnosis not present

## 2018-02-14 DIAGNOSIS — M545 Low back pain, unspecified: Secondary | ICD-10-CM

## 2018-02-14 DIAGNOSIS — M25522 Pain in left elbow: Secondary | ICD-10-CM | POA: Diagnosis not present

## 2018-02-14 DIAGNOSIS — W108XXA Fall (on) (from) other stairs and steps, initial encounter: Secondary | ICD-10-CM | POA: Diagnosis not present

## 2018-02-14 DIAGNOSIS — S199XXA Unspecified injury of neck, initial encounter: Secondary | ICD-10-CM | POA: Diagnosis not present

## 2018-02-14 LAB — CBC
HEMATOCRIT: 42.8 % (ref 39.0–52.0)
HEMOGLOBIN: 13.9 g/dL (ref 13.0–17.0)
MCH: 29 pg (ref 26.0–34.0)
MCHC: 32.5 g/dL (ref 30.0–36.0)
MCV: 89.4 fL (ref 80.0–100.0)
Platelets: 257 10*3/uL (ref 150–400)
RBC: 4.79 MIL/uL (ref 4.22–5.81)
RDW: 12.6 % (ref 11.5–15.5)
WBC: 8.2 10*3/uL (ref 4.0–10.5)
nRBC: 0 % (ref 0.0–0.2)

## 2018-02-14 LAB — BASIC METABOLIC PANEL
Anion gap: 6 (ref 5–15)
BUN: 19 mg/dL (ref 6–20)
CO2: 26 mmol/L (ref 22–32)
Calcium: 9 mg/dL (ref 8.9–10.3)
Chloride: 104 mmol/L (ref 98–111)
Creatinine, Ser: 0.96 mg/dL (ref 0.61–1.24)
GFR calc Af Amer: >60 mL/min (ref >60)
GFR calc non Af Amer: >60 mL/min (ref >60)
Glucose, Bld: 104 mg/dL — ABNORMAL HIGH (ref 70–99)
Potassium: 3.7 mmol/L (ref 3.5–5.1)
Sodium: 136 mmol/L (ref 135–145)

## 2018-02-14 NOTE — ED Triage Notes (Signed)
Pt states he fell backwards on some stairs, was having neck pain and tingling and numbness on his left arm. Pt was seen on UC and sent here with copies of x ray for possible need of CT scan or MRI.

## 2018-02-15 DIAGNOSIS — M542 Cervicalgia: Secondary | ICD-10-CM | POA: Diagnosis not present

## 2018-02-15 DIAGNOSIS — M545 Low back pain: Secondary | ICD-10-CM | POA: Diagnosis not present

## 2018-02-15 MED ORDER — METHOCARBAMOL 500 MG PO TABS: 500.0000 mg | ORAL_TABLET | Freq: Three times a day (TID) | ORAL | 0 refills | Status: DC | PRN

## 2018-02-15 MED ORDER — OXYCODONE HCL 5 MG PO TABS: 5.0000 mg | ORAL_TABLET | ORAL | 0 refills | Status: DC | PRN

## 2018-02-15 NOTE — ED Provider Notes (Addendum)
[facility] Emergency Department Provider Note MRN:  [id number]  Arrival date & time: 02/15/18     Chief Complaint   Fall   History of Present Illness   Brad Holmes is a 47 y.o. year-old male with a history of hypercholesterolemia presenting to the ED with chief complaint of fall.  Patient was standing on some brick steps that were wet with rain, lost his balance, slipped, fell up the stairs, landing on his neck and lower back.  Patient felt immediate pain to the neck and lower back, moderate, sharp.  Noticed numbness to his left foot as well as ring finger and pinky finger of the left hand.  The foot numbness resolved but the numbness to the fingers has continued.  Was evaluated at an urgent care with x-rays that revealed a questionable fracture of the C6 vertebra.  Sent here for CT versus MRI imaging.  Review of Systems  A complete 10 system review of systems was obtained and all systems are negative except as noted in the HPI and PMH.   Patient's Health History    Past Medical History:  Diagnosis Date  . Hypercholesterolemia     No past surgical history on file.  No family history on file.  Social History   Socioeconomic History  . Marital status: Married    Spouse name: Not on file  . Number of children: Not on file  . Years of education: Not on file  . Highest education level: Not on file  Occupational History  . Not on file  Social Needs  . Financial resource strain: Not on file  . Food insecurity:    Worry: Not on file    Inability: Not on file  . Transportation needs:    Medical: Not on file    Non-medical: Not on file  Tobacco Use  . Smoking status: Never Smoker  . Smokeless tobacco: Never Used  Substance and Sexual Activity  . Alcohol use: No  . Drug use: No  . Sexual activity: Not on file  Lifestyle  . Physical activity:    Days per week: Not on file    Minutes per session: Not on file  . Stress: Not on file    Relationships  . Social connections:    Talks on phone: Not on file    Gets together: Not on file    Attends religious service: Not on file    Active member of club or organization: Not on file    Attends meetings of clubs or organizations: Not on file    Relationship status: Not on file  . Intimate partner violence:    Fear of current or ex partner: Not on file    Emotionally abused: Not on file    Physically abused: Not on file    Forced sexual activity: Not on file  Other Topics Concern  . Not on file  Social History Narrative  . Not on file     Physical Exam  Vital Signs and Nursing Notes reviewed Vitals:   02/14/18 2157 02/14/18 2337  BP: (!) 150/91 (!) 146/84  Pulse: 78 79  Resp: 18 18  Temp: 98.3 F (36.8 C)   SpO2: 97% 98%    CONSTITUTIONAL: Well-appearing, NAD NEURO:  Alert and oriented x 3, decreased sensation to the left ring and pinky fingers, as well as the ulnar aspect of the left hand EYES:  eyes equal and reactive ENT/NECK:  no LAD, no JVD CARDIO: Regular  rate, well-perfused, normal S1 and S2 PULM:  CTAB no wheezing or rhonchi GI/GU:  normal bowel sounds, non-distended, non-tender MSK/SPINE:  No gross deformities, no edema SKIN:  no rash, atraumatic PSYCH:  Appropriate speech and behavior  Diagnostic and Interventional Summary    Labs Reviewed  BASIC METABOLIC PANEL - Abnormal; Notable for the following components:      Result Value   Glucose, Bld 104 (*)    All other components within normal limits  CBC    CT Cervical Spine Wo Contrast  Final Result    CT Lumbar Spine Wo Contrast  Final Result      Medications - No data to display   Procedures Critical Care  ED Course and Medical Decision Making  I have reviewed the triage vital signs and the nursing notes.  Pertinent labs & imaging results that were available during my care of the patient were reviewed by me and considered in my medical decision making (see below for  details).  CTs reveal no acute fracture, however patient's neurological deficit in the hand persists.  Will discuss with neurosurgery the need for MRI, as this would require transfer.  Unable to connect with neurosurgery over the phone.  Discussed this with patient, who continues to feel better, thinks that the numbness in the fingers is improving.  Still, transfer to [facility] for [facility] imaging tonight was offered.  Patient defers this offer at this time.  Patient then explains that he has a follow-up appointment with an orthopedic surgeon this coming Monday and is scheduled for a cervical spine MRI this coming Thursday.  This seems to be an appropriate follow-up giving the symptoms currently.  Strict return precautions provided.  After the discussed management above, the patient was determined to be safe for discharge.  The patient was in agreement with this plan and all questions regarding their care were answered.  ED return precautions were discussed and the patient will return to the ED with any significant worsening of condition.  Barth Kirks. Sedonia Small, MD [place] [email or web address]  Final Clinical Impressions(s) / ED Diagnoses     ICD-10-CM   1. Fall, initial encounter W19.XXXA   2. Neck pain M54.2   3. Acute midline low back pain without sciatica M54.5     ED Discharge Orders         Ordered    oxyCODONE (ROXICODONE) 5 MG immediate release tablet  Every 4 hours PRN     02/15/18 0100             Maudie Flakes, MD 02/15/18 0103    Maudie Flakes, MD 02/15/18 [phone]

## 2018-02-15 NOTE — ED Notes (Signed)
Contacted Dr. Zada Finders for second time for consult

## 2018-02-15 NOTE — ED Notes (Signed)
Contacted Dr. Zada Finders again for consult

## 2018-02-15 NOTE — Discharge Instructions (Addendum)
You were evaluated in the Emergency Department and after careful evaluation, we did not find any emergent condition requiring admission or further testing in the hospital.  Your symptoms today seem to be due to bruising and strain from the fall.  There is still a possibility that you have nerve or ligament injury in your neck given that you have some decreased sensation to your fingers.  It is very important that you wear the collar provided at all times until you see your orthopedic doctor on Monday.  Please return to the Emergency Department if you experience any worsening of your condition.  We encourage you to follow up with a primary care provider.  Thank you for allowing [place] to be a part of your care.

## 2018-02-15 NOTE — ED Notes (Signed)
Applied aspen collar to patient; instructed patient on how to apply; patient verbalized understanding.

## 2018-02-17 DIAGNOSIS — S139XXA Sprain of joints and ligaments of unspecified parts of neck, initial encounter: Secondary | ICD-10-CM | POA: Diagnosis not present

## 2018-02-17 DIAGNOSIS — S335XXA Sprain of ligaments of lumbar spine, initial encounter: Secondary | ICD-10-CM | POA: Diagnosis not present

## 2018-03-13 ENCOUNTER — Other Ambulatory Visit: Payer: 59

## 2018-04-02 ENCOUNTER — Ambulatory Visit: Payer: Self-pay

## 2018-04-02 DIAGNOSIS — M79676 Pain in unspecified toe(s): Secondary | ICD-10-CM

## 2018-04-02 DIAGNOSIS — B351 Tinea unguium: Secondary | ICD-10-CM

## 2018-04-08 NOTE — Progress Notes (Signed)
Pt presents with mycotic infection of nails 1-5 bilateral.  All other systems are negative  Laser therapy administered to affected nails and tolerated well. All safety precautions were in place.  3rd treatment.  Follow up in 4 weeks     

## 2018-04-30 ENCOUNTER — Other Ambulatory Visit: Payer: Self-pay

## 2018-05-07 ENCOUNTER — Other Ambulatory Visit: Payer: Self-pay

## 2018-08-07 ENCOUNTER — Ambulatory Visit: Payer: No Typology Code available for payment source | Admitting: Podiatry

## 2018-08-07 ENCOUNTER — Other Ambulatory Visit: Payer: Self-pay

## 2018-08-07 ENCOUNTER — Encounter: Payer: Self-pay | Admitting: Podiatry

## 2018-08-07 VITALS — Temp 98.1°F

## 2018-08-07 DIAGNOSIS — B351 Tinea unguium: Secondary | ICD-10-CM

## 2018-08-07 MED ORDER — TERBINAFINE HCL 250 MG PO TABS: 250.0000 mg | ORAL_TABLET | Freq: Every day | ORAL | 0 refills | Status: DC

## 2018-08-07 MED FILL — TERBINAFINE HCL 250 MG TAB: 250 | 75 days supply | Qty: 75 | Fill #0

## 2018-08-07 NOTE — Progress Notes (Signed)
Subjective:   Patient ID: Brad Holmes, male   DOB: 48 y.o.   MRN: [id number]   HPI Patient is here for fungal condition bilateral nails and skin and states he was only able to take 30 days of the 90 days of oral antifungal as the rest was confiscated.  States that the laser seem to be effective but he had to stop them when the virus started and would like to continue   ROS      Objective:  Physical Exam  Neurovascular status intact with patient shown to have improvement in fungal infection hallux bilateral and several other toes with redness in between his digits     Assessment:  Mycotic nail infection with skin infection that is probable mycotic in nature     Plan:  H&P reviewed condition we will do 75 degrees of antifungal Lamisil treatment along with laser therapy 2 times or possibly 3 and may require further and future pulse therapy

## 2018-08-11 ENCOUNTER — Other Ambulatory Visit: Payer: No Typology Code available for payment source

## 2018-08-11 ENCOUNTER — Encounter: Payer: Self-pay | Admitting: Family Medicine

## 2018-08-11 ENCOUNTER — Other Ambulatory Visit: Payer: Self-pay

## 2018-08-11 ENCOUNTER — Ambulatory Visit (INDEPENDENT_AMBULATORY_CARE_PROVIDER_SITE_OTHER): Payer: No Typology Code available for payment source | Admitting: Family Medicine

## 2018-08-11 DIAGNOSIS — G8929 Other chronic pain: Secondary | ICD-10-CM | POA: Diagnosis not present

## 2018-08-11 DIAGNOSIS — R748 Abnormal levels of other serum enzymes: Secondary | ICD-10-CM

## 2018-08-11 DIAGNOSIS — M25551 Pain in right hip: Secondary | ICD-10-CM | POA: Diagnosis not present

## 2018-08-11 DIAGNOSIS — E7800 Pure hypercholesterolemia, unspecified: Secondary | ICD-10-CM | POA: Diagnosis not present

## 2018-08-11 DIAGNOSIS — Z8249 Family history of ischemic heart disease and other diseases of the circulatory system: Secondary | ICD-10-CM | POA: Diagnosis not present

## 2018-08-11 DIAGNOSIS — M25552 Pain in left hip: Secondary | ICD-10-CM | POA: Diagnosis not present

## 2018-08-11 DIAGNOSIS — Z114 Encounter for screening for human immunodeficiency virus [HIV]: Secondary | ICD-10-CM | POA: Insufficient documentation

## 2018-08-11 DIAGNOSIS — M25561 Pain in right knee: Secondary | ICD-10-CM

## 2018-08-11 DIAGNOSIS — Z1211 Encounter for screening for malignant neoplasm of colon: Secondary | ICD-10-CM | POA: Insufficient documentation

## 2018-08-11 DIAGNOSIS — I10 Essential (primary) hypertension: Secondary | ICD-10-CM | POA: Diagnosis not present

## 2018-08-11 DIAGNOSIS — M25562 Pain in left knee: Secondary | ICD-10-CM

## 2018-08-11 DIAGNOSIS — E785 Hyperlipidemia, unspecified: Secondary | ICD-10-CM | POA: Insufficient documentation

## 2018-08-11 LAB — POCT SEDIMENTATION RATE: POCT SED RATE: 2 mm/hr (ref 0–22)

## 2018-08-11 MED ORDER — LOSARTAN POTASSIUM 25 MG PO TABS: 25.0000 mg | ORAL_TABLET | Freq: Every day | ORAL | 3 refills | Status: DC

## 2018-08-11 MED FILL — LOSARTAN POTASSIUM 25 MG TA: 25 | 90 days supply | Qty: 90 | Fill #0

## 2018-08-11 NOTE — Assessment & Plan Note (Signed)
Screen for inflammatory arthritis.  Given high intensity athletics (cycling) and previous injuries, I believe osteoarthritis is more likely.

## 2018-08-11 NOTE — Progress Notes (Signed)
New Patient Office Visit  Subjective:  Patient ID: Brad Holmes, male    DOB: 12/22/1970  Age: 48 y.o. MRN: [id number]  CC:  Chief Complaint  Patient presents with  . Establish Care    HPI Brad Holmes presents to establish care.  Has several issues. 1. He complains of significant joint pain affecting multiple joints, primarily those of his lower ext.  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  States knees, hips, back and rarely arms are involved.  No prior hx of inflammatory arthropathy.  No fever or weight loss.  2. Hypertension.  BP up today.  See next problems about cardiovascular risk.  3. High cholesterol when last measured in 2011 (LDL=191)  Apparently tried a atorvastatin, which caused myalgias.  4. FHx of early sudden cardiac death.  Father died at age 56 from MI.  Had cardiac cath ~10 years ago.  Reportedly normal coronaries (Was diagnosed with viral myocarditis and recovered.)    5 HPDP, never had HIV screen or colon cancer screen.   Past Medical History:  Diagnosis Date  . Hypercholesterolemia     History reviewed. No pertinent surgical history.  History reviewed. No pertinent family history.  Social History   Socioeconomic History  . Marital status: Married    Spouse name: Not on file  . Number of children: Not on file  . Years of education: Not on file  . Highest education level: Not on file  Occupational History  . Not on file  Social Needs  . Financial resource strain: Not on file  . Food insecurity:    Worry: Not on file    Inability: Not on file  . Transportation needs:    Medical: Not on file    Non-medical: Not on file  Tobacco Use  . Smoking status: Never Smoker  . Smokeless tobacco: Never Used  Substance and Sexual Activity  . Alcohol use: No  . Drug use: No  . Sexual activity: Not on file  Lifestyle  . Physical activity:    Days per week: Not on file    Minutes per session: Not on file  .  Stress: Not on file  Relationships  . Social connections:    Talks on phone: Not on file    Gets together: Not on file    Attends religious service: Not on file    Active member of club or organization: Not on file    Attends meetings of clubs or organizations: Not on file    Relationship status: Not on file  . Intimate partner violence:    Fear of current or ex partner: Not on file    Emotionally abused: Not on file    Physically abused: Not on file    Forced sexual activity: Not on file  Other Topics Concern  . Not on file  Social History Narrative  . Not on file    ROS Review of Systems Denies CP, SOB, change in weight, appetite, bowel or bladder habits.  No bleeding.  No concerning skin lesions. Objective:   Today's Vitals: BP (!) 142/98 (BP Location: Left Arm, Cuff Size: Large)   Pulse 95   Ht 5' 11"  (1.803 m)   Wt 208 lb (94.3 kg)   SpO2 97%   BMI 29.01 kg/m   Physical Exam   HEENT WNL Neck suplle witihout masse Lungs cler Cardiac RRR without m or g abd No organomegally or masses Ext no edema. Joints,  Normal ROM.  No obvious effusions. Neuro Normal gait, motor and sensory  Assessment & Plan:   Problem List Items Addressed This Visit    Screening for HIV without presence of risk factors   Relevant Orders   HIV Antibody (routine testing w rflx)   Hypertension   Relevant Medications   losartan (COZAAR) 25 MG tablet   Other Relevant Orders   CBC   [id number]   Basic metabolic panel   Hypercholesteremia   Relevant Medications   losartan (COZAAR) 25 MG tablet   Other Relevant Orders   Lipid panel   Family history of early CAD   Colon cancer screening   Relevant Orders   Ambulatory referral to Gastroenterology   Chronic arthralgias of knees and hips   Relevant Orders   ANA   Rheumatoid factor   POCT SEDIMENTATION RATE (Completed)      Outpatient Encounter Medications as of 08/11/2018  Medication Sig  . aspirin EC 81 MG tablet Take 81 mg by  mouth daily.  Marland Kitchen losartan (COZAAR) 25 MG tablet Take 1 tablet (25 mg total) by mouth at bedtime.  . terbinafine (LAMISIL) 250 MG tablet Take 1 tablet (250 mg total) by mouth daily. (Patient not taking: Reported on 08/11/2018)  . [DISCONTINUED] diclofenac sodium (VOLTAREN) 1 % GEL Apply 2 g topically 3 (three) times daily.  . [DISCONTINUED] methocarbamol (ROBAXIN) 500 MG tablet Take 1 tablet (500 mg total) by mouth every 8 (eight) hours as needed for muscle spasms.  . [DISCONTINUED] naproxen (NAPROSYN) 500 MG tablet naproxen 500 mg tablet  Take 1 tablet twice a day by oral route.  . [DISCONTINUED] oxyCODONE (ROXICODONE) 5 MG immediate release tablet Take 1 tablet (5 mg total) by mouth every 4 (four) hours as needed for severe pain.  . [DISCONTINUED] oxyCODONE-acetaminophen (PERCOCET/ROXICET) 5-325 MG tablet Take 1-2 tablets by mouth every 6 (six) hours as needed for severe pain.  . [DISCONTINUED] terbinafine (LAMISIL) 250 MG tablet Take 1 tablet (250 mg total) by mouth daily.   No facility-administered encounter medications on file as of 08/11/2018.     Follow-up: No follow-ups on file.   Zenia Resides, MD

## 2018-08-11 NOTE — Patient Instructions (Addendum)
I will call with lab test results Please get religious about taking the aspirin daily.  Unless there has been a dramatic improvement in your cholesterol, I will be prescribing a statin for you.   I am doing blood tests to differentiate between inflammatory arthritis (for example rheumatoid arthritis) and wear and tear arthritis (osteoarthritis.)   I want to start you on a medication for high blood pressure.  Rx sent to pharmacy.  For your wife, it is in the ARB family. Assuming I do start you on a statin, wait two weeks between starting the blood pressure medicine and the statin.   Also, we need to do a second blood test next week for your kidneys.  Please make an appointment for a lab only test one from now.  Let us know when your last tetanus shot was for our records.

## 2018-08-11 NOTE — Assessment & Plan Note (Signed)
Reinforce daily ASA.  Also BP and cholesterol control.

## 2018-08-11 NOTE — Assessment & Plan Note (Signed)
Given FHx of early MI, will Rx based on today's readings.  Needs aggressive BP control FU BMP one week after starting ARB

## 2018-08-11 NOTE — Assessment & Plan Note (Signed)
Screen x 1 

## 2018-08-11 NOTE — Assessment & Plan Note (Addendum)
Likely will need statin based on previous 190 LDL and + FHx.  Only a question of what intensity.  Lipid panel and Rx based on results  Oh my, LDL=260.  Will start crestor 40 and refer to lipid clinic and cardiology.

## 2018-08-11 NOTE — Assessment & Plan Note (Signed)
GI referral for colonoscopy

## 2018-08-12 DIAGNOSIS — R748 Abnormal levels of other serum enzymes: Secondary | ICD-10-CM | POA: Insufficient documentation

## 2018-08-12 LAB — LIPID PANEL
Chol/HDL Ratio: 9 ratio — ABNORMAL HIGH (ref 0.0–5.0)
Cholesterol, Total: 361 mg/dL — ABNORMAL HIGH (ref 100–199)
HDL: 40 mg/dL (ref >39)
LDL Calculated: 260 mg/dL — ABNORMAL HIGH (ref 0–99)
Triglycerides: 303 mg/dL — ABNORMAL HIGH (ref 0–149)
VLDL Cholesterol Cal: 61 mg/dL — ABNORMAL HIGH (ref 5–40)

## 2018-08-12 LAB — CMP14+EGFR
ALT: 89 IU/L — ABNORMAL HIGH (ref 0–44)
AST: 36 IU/L (ref 0–40)
Albumin/Globulin Ratio: 2 (ref 1.2–2.2)
Albumin: 4.5 g/dL (ref 4.0–5.0)
Alkaline Phosphatase: 91 IU/L (ref 39–117)
BUN/Creatinine Ratio: 11 (ref 9–20)
BUN: 10 mg/dL (ref 6–24)
Bilirubin Total: 0.4 mg/dL (ref 0.0–1.2)
CO2: 25 mmol/L (ref 20–29)
Calcium: 10.2 mg/dL (ref 8.7–10.2)
Chloride: 101 mmol/L (ref 96–106)
Creatinine, Ser: 0.92 mg/dL (ref 0.76–1.27)
GFR calc Af Amer: 112 mL/min/{1.73_m2} (ref >59)
GFR calc non Af Amer: 97 mL/min/{1.73_m2} (ref >59)
Globulin, Total: 2.3 g/dL (ref 1.5–4.5)
Glucose: 86 mg/dL (ref 65–99)
Potassium: 4.5 mmol/L (ref 3.5–5.2)
Sodium: 138 mmol/L (ref 134–144)
Total Protein: 6.8 g/dL (ref 6.0–8.5)

## 2018-08-12 LAB — CBC
Hematocrit: 44.7 % (ref 37.5–51.0)
Hemoglobin: 15.5 g/dL (ref 13.0–17.7)
MCH: 29.5 pg (ref 26.6–33.0)
MCHC: 34.7 g/dL (ref 31.5–35.7)
MCV: 85 fL (ref 79–97)
Platelets: 286 10*3/uL (ref 150–450)
RBC: 5.26 x10E6/uL (ref 4.14–5.80)
RDW: 12.1 % (ref 11.6–15.4)
WBC: 5.9 10*3/uL (ref 3.4–10.8)

## 2018-08-12 LAB — ANA: Anti Nuclear Antibody (ANA): NEGATIVE

## 2018-08-12 LAB — HIV ANTIBODY (ROUTINE TESTING W REFLEX): HIV Screen 4th Generation wRfx: NONREACTIVE

## 2018-08-12 LAB — RHEUMATOID FACTOR: Rheumatoid fact SerPl-aCnc: <10 IU/mL (ref 0.0–13.9)

## 2018-08-12 MED ORDER — ROSUVASTATIN CALCIUM 40 MG PO TABS: 40.0000 mg | ORAL_TABLET | Freq: Every day | ORAL | 3 refills | Status: DC

## 2018-08-12 MED FILL — ROSUVASTATIN CALCIUM 40 MG: 40 | 90 days supply | Qty: 90 | Fill #0

## 2018-08-12 NOTE — Assessment & Plan Note (Signed)
FU by me in one month.  Do not start lamisil.  Back burner issue until cards and lipid eval.

## 2018-08-12 NOTE — Addendum Note (Signed)
Addended by: Zenia Resides on: 08/12/2018 10:51 AM   Modules accepted: Orders

## 2018-08-22 ENCOUNTER — Other Ambulatory Visit: Payer: Self-pay

## 2018-08-22 ENCOUNTER — Ambulatory Visit: Payer: No Typology Code available for payment source | Admitting: *Deleted

## 2018-08-22 VITALS — Ht 71.0 in | Wt 203.0 lb

## 2018-08-22 DIAGNOSIS — Z1211 Encounter for screening for malignant neoplasm of colon: Secondary | ICD-10-CM

## 2018-08-22 MED ORDER — SUPREP BOWEL PREP KIT 17.5-3.13-1.6 GM/177ML PO SOLN: 1.0000 | Freq: Once | ORAL | 0 refills | Status: AC

## 2018-08-22 MED FILL — SUPREP BOWEL PREP KIT: 17.5-3.13-1 | 1 days supply | Qty: 354 | Fill #0

## 2018-08-22 NOTE — Progress Notes (Signed)
No egg or soy allergy known to patient  No issues with past sedation with any surgeries  or procedures, no intubation problems  No diet pills per patient No home 02 use per patient  No blood thinners per patient  Pt denies issues with constipation  No A fib or A flutter  EMMI sent to patient Suprep coupon sent to patient(15.00) Jamelle Haring frank's email given forcopy of current insurance

## 2018-08-29 ENCOUNTER — Telehealth: Payer: Self-pay | Admitting: Gastroenterology

## 2018-08-29 NOTE — Telephone Encounter (Signed)

## 2018-09-01 ENCOUNTER — Telehealth: Payer: Self-pay | Admitting: Gastroenterology

## 2018-09-01 ENCOUNTER — Encounter: Payer: No Typology Code available for payment source | Admitting: Gastroenterology

## 2018-09-01 NOTE — Telephone Encounter (Signed)
Pt states 3 out 4 family members have high fever.

## 2018-09-15 ENCOUNTER — Telehealth: Payer: No Typology Code available for payment source | Admitting: Internal Medicine

## 2018-09-30 ENCOUNTER — Telehealth: Payer: Self-pay | Admitting: Internal Medicine

## 2018-09-30 NOTE — Telephone Encounter (Signed)

## 2018-10-01 ENCOUNTER — Encounter: Payer: Self-pay | Admitting: Internal Medicine

## 2018-10-01 ENCOUNTER — Telehealth: Payer: Self-pay | Admitting: Internal Medicine

## 2018-10-01 ENCOUNTER — Telehealth (INDEPENDENT_AMBULATORY_CARE_PROVIDER_SITE_OTHER): Payer: No Typology Code available for payment source | Admitting: Internal Medicine

## 2018-10-01 DIAGNOSIS — E7849 Other hyperlipidemia: Secondary | ICD-10-CM

## 2018-10-01 DIAGNOSIS — Z8249 Family history of ischemic heart disease and other diseases of the circulatory system: Secondary | ICD-10-CM

## 2018-10-01 DIAGNOSIS — R748 Abnormal levels of other serum enzymes: Secondary | ICD-10-CM

## 2018-10-01 NOTE — Telephone Encounter (Signed)
Patient called to review e-visit instructions. Patient aware that the following changes have been made: no med changes Patient aware that they will need the following labs: NMR, LP(a) in 3 months Patient aware that they will need the following test(s): CT calcium score  Follow up in 3 months Referred to Dr. Broadus Izic for [place] evaluation  No further assistance needed at this time.

## 2018-10-01 NOTE — Patient Instructions (Signed)
Medication Instructions:  Your physician recommends that you continue on your current medications as directed. Please refer to the Current Medication list given to you today.  If you need a refill on your cardiac medications before your next appointment, please call your pharmacy.   Lab work: FASTING lab work in 3 months - NMR lipoprofile & LP(a) If you have labs (blood work) drawn today and your tests are completely normal, you will receive your results only by: Marland Kitchen MyChart Message (if you have MyChart) OR . A paper copy in the mail If you have any lab test that is abnormal or we need to change your treatment, we will call you to review the results.  Testing/Procedures: Dr. Debara Pickett has ordered a CT coronary calcium score. This test is done at [address]. This is $150 out of pocket.   Coronary CalciumScan A coronary calcium scan is an imaging test used to look for deposits of calcium and other fatty materials (plaques) in the inner lining of the blood vessels of the heart (coronary arteries). These deposits of calcium and plaques can partly clog and narrow the coronary arteries without producing any symptoms or warning signs. This puts a person at risk for a heart attack. This test can detect these deposits before symptoms develop. Tell a health care provider about:  Any allergies you have.  All medicines you are taking, including vitamins, herbs, eye drops, creams, and over-the-counter medicines.  Any problems you or family members have had with anesthetic medicines.  Any blood disorders you have.  Any surgeries you have had.  Any medical conditions you have.  Whether you are pregnant or may be pregnant. What are the risks? Generally, this is a safe procedure. However, problems may occur, including:  Harm to a pregnant woman and her unborn baby. This test involves the use of radiation. Radiation exposure can be dangerous to a pregnant woman and her unborn baby.  If you are pregnant, you generally should not have this procedure done.  Slight increase in the risk of cancer. This is because of the radiation involved in the test. What happens before the procedure? No preparation is needed for this procedure. What happens during the procedure?  You will undress and remove any jewelry around your neck or chest.  You will put on a hospital gown.  Sticky electrodes will be placed on your chest. The electrodes will be connected to an electrocardiogram (ECG) machine to record a tracing of the electrical activity of your heart.  A CT scanner will take pictures of your heart. During this time, you will be asked to lie still and hold your breath for 2-3 seconds while a picture of your heart is being taken. The procedure may vary among health care providers and hospitals. What happens after the procedure?  You can get dressed.  You can return to your normal activities.  It is up to you to get the results of your test. Ask your health care provider, or the department that is doing the test, when your results will be ready. Summary  A coronary calcium scan is an imaging test used to look for deposits of calcium and other fatty materials (plaques) in the inner lining of the blood vessels of the heart (coronary arteries).  Generally, this is a safe procedure. Tell your health care provider if you are pregnant or may be pregnant.  No preparation is needed for this procedure.  A CT scanner will take pictures of  your heart.  You can return to your normal activities after the scan is done. This information is not intended to replace advice given to you by your health care provider. Make sure you discuss any questions you have with your health care provider. Document Released: 08/18/2007 Document Revised: 01/09/2016 Document Reviewed: 01/09/2016 Elsevier Interactive Patient Education  2017 [employer]    Follow-Up: Dr. Debara Pickett recommends that you schedule  a follow up visit with him the in the [facility] in 3 months. Please have fasting blood work about 1 week prior to this visit and he will review the blood work results with you at your appointment.   You have been referred to Dr. Lattie Corns (geneticist) - [address] - 3rd Floor

## 2018-10-01 NOTE — Progress Notes (Signed)
Virtual Visit via Video Note   This visit type was conducted due to national recommendations for restrictions regarding the COVID-19 Pandemic (e.g. social distancing) in an effort to limit this patient's exposure and mitigate transmission in our community.  Due to his co-morbid illnesses, this patient is at least at moderate risk for complications without adequate follow up.  This format is felt to be most appropriate for this patient at this time.  All issues noted in this document were discussed and addressed.  A limited physical exam was performed with this format.  Please refer to the patient's chart for his consent to telehealth for [facility].   Evaluation Performed:  Doxy[employer]me video visit  Date:  10/01/2018   ID:  Brad Holmes, DOB June 09, 1970, MRN [id number]  Patient Location:  [address] [place] [address]  Provider location:   [address], [address] [place], [place] [address]  PCP:  Brad La, MD  Cardiologist:  No primary care provider on file. Electrophysiologist:  None   Chief Complaint:  Manage dyslipidemia  History of Present Illness:    Brad Holmes is a 48 y.o. male who presents via audio/video conferencing for a telehealth visit today.  Brad Holmes is a pleasant 48 year old male who is kindly referred today for evaluation and management of dyslipidemia.  He has a longstanding history of dyslipidemia probably dating back to his knowledge to his teenage years.  Unfortunately has a significant family history of heart disease in his father side including his father who died in his late 71s of a massive heart attack and his grandfather who is had bypass surgery (I believe) and numerous stents.  Brad Holmes first found out about his cholesterol interestingly when he was [place].  He was found to have a significantly elevated cholesterol at the time.  In the past he has been on atorvastatin which caused significant side effects  however seems to be tolerating rosuvastatin.  He recently established care here and was married this past November.  His wife is a Designer, jewellery I believe in adolescent care with the [facility].  Lab work from June showed total cholesterol 361, triglycerides 303, HDL 40, LDL 260.  Of note he was not fasting during the study.  He did have some other labs done 9 years ago which showed a total cholesterol of 233 with triglycerides 36, HDL 35 and LDL 191.  At that time he had suffered a viral illness and had chest pain with an elevated troponin.  He underwent heart catheterization by Dr. Burt Holmes which demonstrated no significant coronary disease.  He continues to report being asymptomatic however he has several children and recently being remarried is concerned about his cardiovascular risk and wishes to prevent events if possible.  The patient does not have symptoms concerning for COVID-19 infection (fever, chills, cough, or new SHORTNESS OF BREATH).    Prior CV studies:   The following studies were reviewed today:  Chart reviewed Lab work reviewed Cath report reviewed  PMHx:  Past Medical History:  Diagnosis Date   Hypercholesterolemia    Hypertension     Past Surgical History:  Procedure Laterality Date   EYE SURGERY      FAMHx:  Family History  Problem Relation Age of Onset   Colon cancer Neg Hx    Colon polyps Neg Hx    Esophageal cancer Neg Hx    Rectal cancer Neg Hx  Stomach cancer Neg Hx     SOCHx:   reports that he has never smoked. He has never used smokeless tobacco. He reports that he does not drink alcohol or use drugs.  ALLERGIES:  Allergies  Allergen Reactions   No Known Allergies     MEDS:  Current Meds  Medication Sig   aspirin EC 81 MG tablet Take 81 mg by mouth daily.   losartan (COZAAR) 25 MG tablet Take 1 tablet (25 mg total) by mouth at bedtime.   rosuvastatin (CRESTOR) 40 MG tablet Take 1 tablet (40 mg total) by mouth daily.      ROS: Pertinent items are noted in HPI.  Labs/Other Tests and Data Reviewed:    Recent Labs: 08/11/2018: ALT 89; BUN 10; Creatinine, Ser 0.92; Hemoglobin 15.5; Platelets 286; Potassium 4.5; Sodium 138   Recent Lipid Panel Lab Results  Component Value Date/Time   CHOL 361 (H) 08/11/2018 09:42 AM   TRIG 303 (H) 08/11/2018 09:42 AM   HDL 40 08/11/2018 09:42 AM   CHOLHDL 9.0 (H) 08/11/2018 09:42 AM   CHOLHDL 6.7 05/11/2009 09:00 AM   LDLCALC 260 (H) 08/11/2018 09:42 AM    Wt Readings from Last 3 Encounters:  08/22/18 203 lb (92.1 kg)  08/11/18 208 lb (94.3 kg)  02/14/18 210 lb (95.3 kg)     Exam:    Vital Signs:  There were no vitals taken for this visit.   General appearance: alert and no distress Lungs: No visual respiratory difficulty Heart: regular rate and rhythm, S1, S2 normal, no murmur, click, rub or gallop Extremities: extremities normal, atraumatic, no cyanosis or edema Skin: Skin color, texture, turgor normal. No rashes or lesions Neurologic: Mental status: Alert, oriented, thought content appropriate Psych: Pleasant  ASSESSMENT & PLAN:    1. Probable familial hyperlipidemia, Dutch score 6 2. Strong family history of premature coronary disease 3. Angiographically normal coronaries by cath in 2011, NSTEMI   Brad Holmes has probable familial hyperlipidemia.  Although he has high triglycerides, suspect that could be partially due to nonfasting or dietary, as it would be unusual for FH to give that presentation.  Other options would be familial combined hyperlipidemia which is also associated with elevated triglycerides or dysbetalipoproteinemia, and APO E2 homozygous disorder.  I think it would be helpful for him to undergo genetic testing as he has 3 biologic children which would be helpful in screening.  They are actually scheduled to have a lipid profile.  Additionally, he is on max tolerated rosuvastatin at this point.  I like to repeat a lipid profile in about 2  months and will get an extended lipid NMR.  We will also check LP(a).  At that time I suspect that we would consider pursuing additional therapy likely with PCSK9 inhibitor.  Finally, would like to check a coronary calcium score.  Although his cath showed no obstructive luminal disease, he may have had some disease of the coronary arterial walls which would be better captured with CT.  Thanks again for the kind referral.  Follow-up with me in 2 to 3 months.  COVID-19 Education: The signs and symptoms of COVID-19 were discussed with the patient and how to seek care for testing (follow up with PCP or arrange E-visit).  The importance of social distancing was discussed today.  Patient Risk:   After full review of this patients clinical status, I feel that they are at least moderate risk at this time.  Time:   Today, I have spent  25 minutes with the patient with telehealth technology discussing dyslipidemia, probable familial hyperlipidemia, strong family history of coronary disease, genetic risk and other factors..     Medication Adjustments/Labs and Tests Ordered: Current medicines are reviewed at length with the patient today.  Concerns regarding medicines are outlined above.   Tests Ordered: Orders Placed This Encounter  Procedures   CT CARDIAC SCORING   Lipoprotein A (LPA)   NMR, lipoprofile   Familial Hypercholesterolemia (COHESION)   Ambulatory referral to Genetics    Medication Changes: No orders of the defined types were placed in this encounter.   Disposition:  in 3 month(s)  Pixie Casino, MD, [facility], [place] Director of the Advanced Lipid Disorders &  Cardiovascular Risk Reduction Clinic Diplomate of the American Board of Clinical Lipidology Attending Cardiologist  Direct Dial: [phone]   Fax: [phone]  Website:  [email or web address]  Pixie Casino, MD  10/01/2018 9:34 PM

## 2018-11-11 ENCOUNTER — Inpatient Hospital Stay: Admission: RE | Admit: 2018-11-11 | Payer: No Typology Code available for payment source | Source: Ambulatory Visit

## 2018-11-13 ENCOUNTER — Encounter (INDEPENDENT_AMBULATORY_CARE_PROVIDER_SITE_OTHER): Payer: Self-pay

## 2018-11-13 ENCOUNTER — Ambulatory Visit: Payer: No Typology Code available for payment source | Admitting: Genetic Counselor

## 2018-11-13 ENCOUNTER — Other Ambulatory Visit: Payer: Self-pay

## 2018-11-14 ENCOUNTER — Other Ambulatory Visit: Payer: Self-pay

## 2018-11-14 ENCOUNTER — Inpatient Hospital Stay (HOSPITAL_COMMUNITY): Payer: No Typology Code available for payment source

## 2018-11-14 ENCOUNTER — Emergency Department (HOSPITAL_COMMUNITY): Payer: No Typology Code available for payment source

## 2018-11-14 ENCOUNTER — Encounter (HOSPITAL_COMMUNITY): Payer: Self-pay | Admitting: Emergency Medicine

## 2018-11-14 ENCOUNTER — Inpatient Hospital Stay (HOSPITAL_COMMUNITY)
Admission: EM | Admit: 2018-11-14 | Discharge: 2018-11-15 | DRG: 066 | Disposition: A | Discharge status: Home / Self Care | Payer: No Typology Code available for payment source | Attending: Family Medicine | Admitting: Family Medicine

## 2018-11-14 ENCOUNTER — Other Ambulatory Visit (HOSPITAL_COMMUNITY): Payer: No Typology Code available for payment source

## 2018-11-14 DIAGNOSIS — Z20828 Contact with and (suspected) exposure to other viral communicable diseases: Secondary | ICD-10-CM | POA: Diagnosis present

## 2018-11-14 DIAGNOSIS — F410 Panic disorder [episodic paroxysmal anxiety] without agoraphobia: Secondary | ICD-10-CM | POA: Diagnosis present

## 2018-11-14 DIAGNOSIS — M25552 Pain in left hip: Secondary | ICD-10-CM | POA: Diagnosis present

## 2018-11-14 DIAGNOSIS — M25562 Pain in left knee: Secondary | ICD-10-CM | POA: Diagnosis present

## 2018-11-14 DIAGNOSIS — I63511 Cerebral infarction due to unspecified occlusion or stenosis of right middle cerebral artery: Principal | ICD-10-CM | POA: Diagnosis present

## 2018-11-14 DIAGNOSIS — Z8349 Family history of other endocrine, nutritional and metabolic diseases: Secondary | ICD-10-CM | POA: Diagnosis not present

## 2018-11-14 DIAGNOSIS — M25561 Pain in right knee: Secondary | ICD-10-CM | POA: Diagnosis present

## 2018-11-14 DIAGNOSIS — E785 Hyperlipidemia, unspecified: Secondary | ICD-10-CM | POA: Diagnosis present

## 2018-11-14 DIAGNOSIS — R29705 NIHSS score 5: Secondary | ICD-10-CM | POA: Diagnosis present

## 2018-11-14 DIAGNOSIS — I6521 Occlusion and stenosis of right carotid artery: Secondary | ICD-10-CM | POA: Diagnosis present

## 2018-11-14 DIAGNOSIS — E7800 Pure hypercholesterolemia, unspecified: Secondary | ICD-10-CM | POA: Diagnosis not present

## 2018-11-14 DIAGNOSIS — E7801 Familial hypercholesterolemia: Secondary | ICD-10-CM | POA: Diagnosis present

## 2018-11-14 DIAGNOSIS — R29810 Facial weakness: Secondary | ICD-10-CM | POA: Diagnosis present

## 2018-11-14 DIAGNOSIS — Z7982 Long term (current) use of aspirin: Secondary | ICD-10-CM

## 2018-11-14 DIAGNOSIS — Z79899 Other long term (current) drug therapy: Secondary | ICD-10-CM

## 2018-11-14 DIAGNOSIS — R29700 NIHSS score 0: Secondary | ICD-10-CM | POA: Diagnosis not present

## 2018-11-14 DIAGNOSIS — G8194 Hemiplegia, unspecified affecting left nondominant side: Secondary | ICD-10-CM | POA: Diagnosis not present

## 2018-11-14 DIAGNOSIS — G459 Transient cerebral ischemic attack, unspecified: Secondary | ICD-10-CM

## 2018-11-14 DIAGNOSIS — R471 Dysarthria and anarthria: Secondary | ICD-10-CM | POA: Diagnosis present

## 2018-11-14 DIAGNOSIS — G8929 Other chronic pain: Secondary | ICD-10-CM | POA: Diagnosis present

## 2018-11-14 DIAGNOSIS — Z8249 Family history of ischemic heart disease and other diseases of the circulatory system: Secondary | ICD-10-CM

## 2018-11-14 DIAGNOSIS — I10 Essential (primary) hypertension: Secondary | ICD-10-CM | POA: Diagnosis present

## 2018-11-14 DIAGNOSIS — R079 Chest pain, unspecified: Secondary | ICD-10-CM

## 2018-11-14 DIAGNOSIS — G43909 Migraine, unspecified, not intractable, without status migrainosus: Secondary | ICD-10-CM | POA: Diagnosis present

## 2018-11-14 DIAGNOSIS — I639 Cerebral infarction, unspecified: Secondary | ICD-10-CM

## 2018-11-14 DIAGNOSIS — M25551 Pain in right hip: Secondary | ICD-10-CM | POA: Diagnosis present

## 2018-11-14 HISTORY — PX: IR ANGIO VERTEBRAL SEL VERTEBRAL BILAT MOD SED: IMG5369

## 2018-11-14 HISTORY — PX: IR ANGIO INTRA EXTRACRAN SEL COM CAROTID INNOMINATE BILAT MOD SED: IMG5360

## 2018-11-14 HISTORY — DX: Cerebral infarction, unspecified: I63.9

## 2018-11-14 LAB — I-STAT CHEM 8, ED
BUN: 13 mg/dL (ref 6–20)
Calcium, Ion: 1.19 mmol/L (ref 1.15–1.40)
Chloride: 104 mmol/L (ref 98–111)
Creatinine, Ser: 0.9 mg/dL (ref 0.61–1.24)
Glucose, Bld: 98 mg/dL (ref 70–99)
HCT: 39 % (ref 39.0–52.0)
Hemoglobin: 13.3 g/dL (ref 13.0–17.0)
Potassium: 3.8 mmol/L (ref 3.5–5.1)
Sodium: 138 mmol/L (ref 135–145)
TCO2: 24 mmol/L (ref 22–32)

## 2018-11-14 LAB — RAPID URINE DRUG SCREEN, HOSP PERFORMED
Amphetamines: NOT DETECTED
Barbiturates: NOT DETECTED
Benzodiazepines: NOT DETECTED
Cocaine: NOT DETECTED
Opiates: NOT DETECTED
Tetrahydrocannabinol: NOT DETECTED

## 2018-11-14 LAB — COMPREHENSIVE METABOLIC PANEL
ALT: 35 U/L (ref 0–44)
AST: 21 U/L (ref 15–41)
Albumin: 3.4 g/dL — ABNORMAL LOW (ref 3.5–5.0)
Alkaline Phosphatase: 63 U/L (ref 38–126)
Anion gap: 8 (ref 5–15)
BUN: 12 mg/dL (ref 6–20)
CO2: 22 mmol/L (ref 22–32)
Calcium: 8.8 mg/dL — ABNORMAL LOW (ref 8.9–10.3)
Chloride: 106 mmol/L (ref 98–111)
Creatinine, Ser: 0.97 mg/dL (ref 0.61–1.24)
GFR calc Af Amer: >60 mL/min (ref >60)
GFR calc non Af Amer: >60 mL/min (ref >60)
Glucose, Bld: 107 mg/dL — ABNORMAL HIGH (ref 70–99)
Potassium: 3.9 mmol/L (ref 3.5–5.1)
Sodium: 136 mmol/L (ref 135–145)
Total Bilirubin: 0.5 mg/dL (ref 0.3–1.2)
Total Protein: 5.8 g/dL — ABNORMAL LOW (ref 6.5–8.1)

## 2018-11-14 LAB — URINALYSIS, ROUTINE W REFLEX MICROSCOPIC
Bacteria, UA: NONE SEEN
Bilirubin Urine: NEGATIVE
Glucose, UA: NEGATIVE mg/dL
Hgb urine dipstick: NEGATIVE
Ketones, ur: NEGATIVE mg/dL
Leukocytes,Ua: NEGATIVE
Nitrite: NEGATIVE
Protein, ur: NEGATIVE mg/dL
Specific Gravity, Urine: 1.034 — ABNORMAL HIGH (ref 1.005–1.030)
pH: 6 (ref 5.0–8.0)

## 2018-11-14 LAB — ECHOCARDIOGRAM COMPLETE
Height: 71 in
Weight: 3389.79 [oz_av]

## 2018-11-14 LAB — DIFFERENTIAL
Abs Immature Granulocytes: 0.02 10*3/uL (ref 0.00–0.07)
Basophils Absolute: 0 10*3/uL (ref 0.0–0.1)
Basophils Relative: 1 %
Eosinophils Absolute: 0.1 10*3/uL (ref 0.0–0.5)
Eosinophils Relative: 2 %
Immature Granulocytes: 0 %
Lymphocytes Relative: 28 %
Lymphs Abs: 1.8 10*3/uL (ref 0.7–4.0)
Monocytes Absolute: 0.7 10*3/uL (ref 0.1–1.0)
Monocytes Relative: 10 %
Neutro Abs: 3.7 10*3/uL (ref 1.7–7.7)
Neutrophils Relative %: 59 %

## 2018-11-14 LAB — CBG MONITORING, ED: Glucose-Capillary: 106 mg/dL — ABNORMAL HIGH (ref 70–99)

## 2018-11-14 LAB — CBC
HCT: 40.5 % (ref 39.0–52.0)
Hemoglobin: 13.7 g/dL (ref 13.0–17.0)
MCH: 30 pg (ref 26.0–34.0)
MCHC: 33.8 g/dL (ref 30.0–36.0)
MCV: 88.6 fL (ref 80.0–100.0)
Platelets: 244 10*3/uL (ref 150–400)
RBC: 4.57 MIL/uL (ref 4.22–5.81)
RDW: 12.7 % (ref 11.5–15.5)
WBC: 6.4 10*3/uL (ref 4.0–10.5)
nRBC: 0 % (ref 0.0–0.2)

## 2018-11-14 LAB — LIPID PANEL
Cholesterol: 212 mg/dL — ABNORMAL HIGH (ref 0–200)
HDL: 33 mg/dL — ABNORMAL LOW
LDL Cholesterol: 145 mg/dL — ABNORMAL HIGH (ref 0–99)
Total CHOL/HDL Ratio: 6.4 ratio
Triglycerides: 171 mg/dL — ABNORMAL HIGH
VLDL: 34 mg/dL (ref 0–40)

## 2018-11-14 LAB — TROPONIN I (HIGH SENSITIVITY)
Troponin I (High Sensitivity): 7 ng/L (ref <18)
Troponin I (High Sensitivity): 6 ng/L (ref <18)
Troponin I (High Sensitivity): 6 ng/L (ref <18)

## 2018-11-14 LAB — PROTIME-INR
INR: 0.9 (ref 0.8–1.2)
Prothrombin Time: 12.4 seconds (ref 11.4–15.2)

## 2018-11-14 LAB — ETHANOL: Alcohol, Ethyl (B): <10 mg/dL (ref <10)

## 2018-11-14 LAB — APTT: aPTT: 28 seconds (ref 24–36)

## 2018-11-14 LAB — HEMOGLOBIN A1C
Hgb A1c MFr Bld: 5.5 % (ref 4.8–5.6)
Mean Plasma Glucose: 111.15 mg/dL

## 2018-11-14 LAB — SARS CORONAVIRUS 2 BY RT PCR (HOSPITAL ORDER, PERFORMED IN ~~LOC~~ HOSPITAL LAB): SARS Coronavirus 2: NEGATIVE

## 2018-11-14 MED ORDER — IOHEXOL 350 MG/ML SOLN
100.0000 mL | Freq: Once | INTRAVENOUS | Status: AC | PRN
  Administered 2018-11-14: 100 mL via INTRAVENOUS

## 2018-11-14 MED ORDER — ASPIRIN EC 81 MG PO TBEC: 81.0000 mg | DELAYED_RELEASE_TABLET | Freq: Every day | ORAL | Status: DC

## 2018-11-14 MED ORDER — KETOROLAC TROMETHAMINE 30 MG/ML IJ SOLN
30.0000 mg | Freq: Once | INTRAMUSCULAR | Status: AC
  Administered 2018-11-14: 30 mg via INTRAVENOUS

## 2018-11-14 MED ORDER — SODIUM CHLORIDE 0.9 % IV BOLUS
1000.0000 mL | Freq: Once | INTRAVENOUS | Status: AC
  Administered 2018-11-14: 1000 mL via INTRAVENOUS

## 2018-11-14 MED ORDER — ASPIRIN EC 325 MG PO TBEC
650.0000 mg | DELAYED_RELEASE_TABLET | Freq: Once | ORAL | Status: AC
  Administered 2018-11-14: 650 mg via ORAL
  Filled 2018-11-14: qty 2

## 2018-11-14 MED ORDER — LIDOCAINE HCL 1 % IJ SOLN
INTRAMUSCULAR | Status: AC
  Filled 2018-11-14: qty 20

## 2018-11-14 MED ORDER — ACETAMINOPHEN 650 MG RE SUPP: 650.0000 mg | RECTAL | Status: DC | PRN

## 2018-11-14 MED ORDER — SODIUM CHLORIDE 0.9 % IV SOLN
Freq: Once | INTRAVENOUS | Status: AC
  Administered 2018-11-14: 125 mL/h via INTRAVENOUS

## 2018-11-14 MED ORDER — IOHEXOL 300 MG/ML  SOLN
150.0000 mL | Freq: Once | INTRAMUSCULAR | Status: AC | PRN
  Administered 2018-11-14: 80 mL via INTRA_ARTERIAL

## 2018-11-14 MED ORDER — CLOPIDOGREL BISULFATE 75 MG PO TABS: 75.0000 mg | ORAL_TABLET | Freq: Every day | ORAL | Status: DC

## 2018-11-14 MED ORDER — ACETAMINOPHEN 325 MG PO TABS
ORAL_TABLET | ORAL | Status: AC
  Filled 2018-11-14: qty 2

## 2018-11-14 MED ORDER — SODIUM CHLORIDE 0.9 % IV SOLN
INTRAVENOUS | Status: AC | PRN
  Administered 2018-11-14: 250 mL via INTRAVENOUS

## 2018-11-14 MED ORDER — ASPIRIN EC 325 MG PO TBEC
325.0000 mg | DELAYED_RELEASE_TABLET | Freq: Every day | ORAL | Status: DC
  Administered 2018-11-15: 325 mg via ORAL
  Filled 2018-11-14: qty 1

## 2018-11-14 MED ORDER — MIDAZOLAM HCL 2 MG/2ML IJ SOLN
INTRAMUSCULAR | Status: AC
  Filled 2018-11-14: qty 2

## 2018-11-14 MED ORDER — SODIUM CHLORIDE 0.9 % IV SOLN: INTRAVENOUS | Status: DC

## 2018-11-14 MED ORDER — CLOPIDOGREL BISULFATE 75 MG PO TABS: 300.0000 mg | ORAL_TABLET | Freq: Once | ORAL | Status: DC

## 2018-11-14 MED ORDER — ACETAMINOPHEN 160 MG/5ML PO SOLN: 650.0000 mg | ORAL | Status: DC | PRN

## 2018-11-14 MED ORDER — FENTANYL CITRATE (PF) 100 MCG/2ML IJ SOLN
INTRAMUSCULAR | Status: AC | PRN
  Administered 2018-11-14: 25 ug via INTRAVENOUS

## 2018-11-14 MED ORDER — ROSUVASTATIN CALCIUM 20 MG PO TABS
40.0000 mg | ORAL_TABLET | Freq: Every day | ORAL | Status: DC
  Administered 2018-11-15: 40 mg via ORAL
  Filled 2018-11-14: qty 2

## 2018-11-14 MED ORDER — FENTANYL CITRATE (PF) 100 MCG/2ML IJ SOLN
INTRAMUSCULAR | Status: AC
  Filled 2018-11-14: qty 2

## 2018-11-14 MED ORDER — ENOXAPARIN SODIUM 40 MG/0.4ML ~~LOC~~ SOLN
40.0000 mg | SUBCUTANEOUS | Status: DC
  Administered 2018-11-14: 40 mg via SUBCUTANEOUS
  Filled 2018-11-14 (×2): qty 0.4

## 2018-11-14 MED ORDER — SODIUM CHLORIDE 0.9 % IV SOLN: INTRAVENOUS | Status: AC

## 2018-11-14 MED ORDER — ENOXAPARIN SODIUM 40 MG/0.4ML ~~LOC~~ SOLN: 40.0000 mg | Freq: Every day | SUBCUTANEOUS | Status: DC

## 2018-11-14 MED ORDER — KETOROLAC TROMETHAMINE 30 MG/ML IJ SOLN
INTRAMUSCULAR | Status: AC
  Filled 2018-11-14: qty 1

## 2018-11-14 MED ORDER — HEPARIN SODIUM (PORCINE) 1000 UNIT/ML IJ SOLN
INTRAMUSCULAR | Status: AC
  Filled 2018-11-14: qty 1

## 2018-11-14 MED ORDER — ATORVASTATIN CALCIUM 80 MG PO TABS: 80.0000 mg | ORAL_TABLET | Freq: Every day | ORAL | Status: DC

## 2018-11-14 MED ORDER — HEPARIN SODIUM (PORCINE) 1000 UNIT/ML IJ SOLN
INTRAMUSCULAR | Status: AC | PRN
  Administered 2018-11-14: 1000 [IU] via INTRAVENOUS

## 2018-11-14 MED ORDER — ACETAMINOPHEN 325 MG PO TABS
650.0000 mg | ORAL_TABLET | ORAL | Status: DC | PRN
  Administered 2018-11-14 (×2): 650 mg via ORAL
  Filled 2018-11-14: qty 2

## 2018-11-14 MED ORDER — STROKE: EARLY STAGES OF RECOVERY BOOK
Freq: Once | Status: DC
  Filled 2018-11-14: qty 1

## 2018-11-14 MED ORDER — MIDAZOLAM HCL 2 MG/2ML IJ SOLN
INTRAMUSCULAR | Status: AC | PRN
  Administered 2018-11-14: 1 mg via INTRAVENOUS

## 2018-11-14 NOTE — Progress Notes (Addendum)
FPTS Brief Note  S: Patient reported chest pain to nurse. Patient reports 30 minutes of left sided, dull chest pain along mid-axillary line. Improved by deep breathing and mask removal. No change with palpation. No rash. Rates 2/10. No prior episodes of chest pain. Feels like gas bubble he reports. He reports he is hungry and also has sensation to have BM.   O" Today's Vitals   11/14/18 1500 11/14/18 1530 11/14/18 1600 11/14/18 1700  BP: 139/88  132/82 140/87  Pulse: 63 74 74 78  Resp: 17 16 14 20   Temp:      TempSrc:      SpO2: 97% 96% 100%   Weight:      Height:      PainSc:    2    Body mass index is 29.55 kg/m. Cardiac: Regular rate and rhythm. Normal S1/S2. No murmurs, rubs, or gallops appreciated. Lungs: Clear bilaterally to ascultation.  Extremities: Warm, well perfused without edema.  Skin: Warm, dry Psych: Pleasant and appropriate  No rash over area.  A/P: Atypical chest pain, patient reports feels like gas bubble. Has not eaten all day. Given significant risk for CAD, will obtain EKG, troponin. Repeat 1 view chest x-ray. Declines GI cocktail or other medication, he would like to try eating and having a bowel movement.   Dorris Singh, MD  Family Medicine Teaching Service   Reviewed EKG at bedside, NSR no changes from prior. [place]

## 2018-11-14 NOTE — ED Notes (Signed)
Stroke Swallow Screen: Unable to complete.  KEEP PT NPO.

## 2018-11-14 NOTE — Sedation Documentation (Signed)
ETC02 monitor O2 removed per md  

## 2018-11-14 NOTE — ED Notes (Signed)
Called to activate Code stroke @ 1:59AM

## 2018-11-14 NOTE — Progress Notes (Signed)
  Echocardiogram 2D Echocardiogram has been performed.  Brad Holmes 11/14/2018, 5:49 PM

## 2018-11-14 NOTE — ED Notes (Signed)
Paged out @ 2:03 AM Code Stroke

## 2018-11-14 NOTE — Progress Notes (Signed)
Pt is arousable on calling. He states that he is sleepy from being up most of the night. He denies pain other than a mild headache. Wife at bedside. Vitals stable, right femoral site level 0.

## 2018-11-14 NOTE — ED Notes (Signed)
Patient transported to MRI 

## 2018-11-14 NOTE — Progress Notes (Signed)
Pt arrived to room, VSS, NAD noted. MD paged to notify.

## 2018-11-14 NOTE — Progress Notes (Signed)
Pt is sleeping at this time. Wife at bedside. No complaints. VSS, dressing to rt groin cdi, level 0

## 2018-11-14 NOTE — ED Notes (Signed)
Verbal: TIA alert Neuro checks 30 min

## 2018-11-14 NOTE — ED Notes (Signed)
Patient transported to CT scan . 

## 2018-11-14 NOTE — Plan of Care (Signed)
Patient stable, discussed POC with patient, agreeable with plan, denies question/concerns at this time.  

## 2018-11-14 NOTE — ED Notes (Signed)
Patient transported to IR 

## 2018-11-14 NOTE — H&P (Addendum)
[facility] Admission History and Physical Service Pager: [phone]  Patient name: Brad Holmes Medical record number: [id number] Date of birth: 1970/05/10 Age: 48 y.o. Gender: male  Primary Care Provider: Dickie La, MD Consultants: Neurology, Interventional Radiology Code Status: Full Preferred Emergency Contact: Urban Gibson  Chief Complaint: left sided weakness   Assessment and Plan: Brad Holmes is a 48 y.o. male presenting with acute onset left-sided weakness difficulty speaking. PMH is significant for migraines, HTN, HLD, OA, and FMHx of CAD  Transient ischemic attack Patient states he was lying in bed this morning and woke up feeling like he was short of breath. SOB described as a  "large dog sitting on his chest." He tried to get out of bed however slid to the floor. His wife woke up and up the patient get up from his knees. Patient was able to get dressed and his wife drove him to the hospital. Presented to the ED with dysarthria and mild left-sided facial droop and left arm and leg weakness. Prior to ED visit, patient was last known normal at bedtime 10:15 PM. neurology was consulted and patient was administered tPA. Per neurologist, Dr. Cheral Marker, after CT head  patient had significant improvement in his left-sided weakness and facial droop and has exam normalized following CTA. CT head was negative for intracranial hemorrhage. CTA head and neck and cerebral perfusion studies indicated severe stenosis of the M1 segment of the right MCA with widely patent distal branches and mild to moderate narrowing of the right carotid terminus. Chest x-ray was negative for acute cardiopulmonary processes. Lab work including HS troponin, CBC, CMP, INR, aPTT, ethanol are normal. Patient was given crushed aspirin 650 mg. UA normal and UDS negative. Vital signs have remained stable. Denies current shortness of breath but states occasionally he gets a tingling sensation in his  arms. Patient has a right-sided occipital headache radiating anteriorly. On exam, LUE strength 3/5 otherwise grossly normal. No appreciable facial droop or slurred speech.  Recent and remote memory intact. Obtain echocardiogram, fasting lipid panel, A1c, and MRI brain. IR consulted for angiogram in the morning.   -Admit to telemetry, attending Dr. Erin Hearing -Neurology consulted, appreciate recommendations -Four vessel catheter-based angiogram in the AM with Dr. Estanislado Pandy -MRI brain without contrast -Echocardiogram -A1C, fasting lipid panel  -mIVF 138mL/hr, s/p 1 L bolus -NPO -Neurochecks q2h -Vital signs per routine -PT /OT consult -ASA 81 mg daily may need plavix added for DAPT pending further eval -Trend troponins givn chest pressure story and high risk fam hx -Changed Rosuvastatin 40mg  to Atorvastatin 80 mg   Hypertension Blood pressures since admission have ranged between 147/95-144/86.  Home meds include losartan 5 mg at bedtime. -Hold home meds.  -Permissive HTN protocol. Correct BP if SBP is > [id number] or diastolic is > 123456 - Monitor blood pressures  Hyperlipidemia Patient has significant family history of hypercholesterolemia. Pt's father died at the age of 58 from an AMI. Most recent LDL was 260 on 08/11/2018.  Home regimen includes ASA daily, Rosuvastatin 40 mg.  Per neurology recommendations switch patient to atorvastatin.  -Switch to Atorvastatin 80 mg    Family Hx of hypercholesterolemia Patient follows with Dr. Debara Pickett, cardiology.  Has a history of family or hyperlipidemia and patient's father passed away at age 44 due to MI.  Most recent lipid panel 08/2018  total cholesterol 361, triglycerides 303, HDL 40, LDL 260.  Cardiology recommended genetic familial hypercholesterolemia testing and obtain a coronary calcium score. Patient  had a heart cath by Dr. Burt Knack about 10 years ago which did not indicate any significant coronary disease. Initial HS troponin negative.  FEN/GI:  NPO Prophylaxis: Levenox  Disposition: likely home, pending medical clearance   History of Present Illness:  Brad Holmes is a 48 y.o. male presenting with acute onset of difficulty speaking and left sided weakness. Patient states that he was asleep tonight when he awoke having trouble breathing. He tried to get out of bed, but he couldn't. He said he has a big dog and felt like he was sitting on his chest and then he started having a panic attack and then he could not get out of bed. He slid down and was unable to walk. His wife evaluated him and thought he was having a stroke and drove him to the ED. He said when he was in the CT here his symptoms resolved. He reports when he "fell" out of bed, it was more of a slide, and he did not hit his head. He initially was able to walk, but could not walk at all once he arrived tot he ED. He reports he has had a few times where he couldn't breathe due to chest pressure since having the CT. He reports some nausea, no vomiting. Wife reports he was feeling more tired today. States that the past week he has not been sleeping well. He reports he is also having a terrible headache on the R side around his eye and on the side of his face that feels like he got hit.   Wife reports the heard him thrashing, and he was holding the bed. He told her he was having trouble breathing and she reports his left side was drooped and his left arm felt down. Wife got him here within 10 minutes from onset. He was unable to buckle the seatbelt in the car and became a little worse. She said he now seems much better but is having some twitching on the left side. She reports his cognition has been normal.  Review Of Systems: Per HPI with the following additions:   Review of Systems  Constitutional: Negative for fever.  HENT: Negative for congestion and sore throat.   Respiratory: Positive for shortness of breath. Negative for cough.   Cardiovascular: Negative for chest pain and leg  swelling.       Chest pressure  Gastrointestinal: Positive for nausea. Negative for abdominal pain, blood in stool, constipation, diarrhea and vomiting.  Genitourinary: Negative for dysuria.  Musculoskeletal: Negative for joint pain.  Skin: Negative for rash.  Neurological: Positive for speech change, focal weakness and headaches. Negative for dizziness and loss of consciousness.  Psychiatric/Behavioral: The patient has insomnia.     Patient Active Problem List   Diagnosis Date Noted  . Abnormal transaminases 08/12/2018  . Family history of early CAD 08/11/2018  . Hypertension 08/11/2018  . Hypercholesteremia 08/11/2018  . Screening for HIV without presence of risk factors 08/11/2018  . Colon cancer screening 08/11/2018  . Chronic arthralgias of knees and hips 10/09/2016   Past Medical History: Past Medical History:  Diagnosis Date  . Hypercholesterolemia   . Hypertension    Past Surgical History: Past Surgical History:  Procedure Laterality Date  . EYE SURGERY     Social History: Social History   Tobacco Use  . Smoking status: Never Smoker  . Smokeless tobacco: Never Used  Substance Use Topics  . Alcohol use: No  . Drug use: No   Additional  social history: recently married, 3 biological children  Please also refer to relevant sections of EMR.  Family History: Family History  Problem Relation Age of Onset  . Colon cancer Neg Hx   . Colon polyps Neg Hx   . Esophageal cancer Neg Hx   . Rectal cancer Neg Hx   . Stomach cancer Neg Hx   Dad died at 5 yo of heart attack.   Allergies and Medications: Allergies  Allergen Reactions  . No Known Allergies    No current facility-administered medications on file prior to encounter.    Current Outpatient Medications on File Prior to Encounter  Medication Sig Dispense Refill  . aspirin EC 81 MG tablet Take 81 mg by mouth daily.    Marland Kitchen losartan (COZAAR) 25 MG tablet Take 1 tablet (25 mg total) by mouth at bedtime. 90  tablet 3  . rosuvastatin (CRESTOR) 40 MG tablet Take 1 tablet (40 mg total) by mouth daily. 90 tablet 3    Objective: BP (!) 147/95   Pulse 90   Temp 99.1 F (37.3 C) (Oral)   Resp 14   Ht 5\' 11"  (1.803 m)   Wt 96.1 kg   SpO2 98%   BMI 29.55 kg/m  Exam:  GEN:     alert, oriented,  in no distress  HENT:  mucus membranes moist, oropharyngeal without lesions or erythema,  nares patent, no nasal discharge, normal dentition  EYES:   pupils equal and reactive (left pupil sluggish at baseline s/p eye surgery), EOM intact NECK:  normal ROM, no lymphadenopathy  RESP:  clear to auscultation bilaterally, no increased work of breathing  CVS:   regular rate and rhythm, no murmur, bilateral upper and lower distal pulses equal and intact   ABD:  soft, non-tender; bowel sounds present; no palpable masses, non distended EXT:   normal ROM, atraumatic, no edema  NEURO:  Gross sensation intact, LUE strength 3/5, bl LLE 4/5 and RUE and RLE strength 5/5, speech normal, alert and oriented x4, recent and remote memory intact, no protonator drift, no appreciable facial droop Skin:   warm and dry, no rash  Psych: Normal affect and thought content    Labs and Imaging: CBC BMET  Recent Labs  Lab 11/14/18 0248 11/14/18 0330  WBC 6.4  --   HGB 13.7 13.3  HCT 40.5 39.0  PLT 244  --    Recent Labs  Lab 11/14/18 0248 11/14/18 0330  NA 136 138  K 3.9 3.8  CL 106 104  CO2 22  --   BUN 12 13  CREATININE 0.97 0.90  GLUCOSE 107* 98  CALCIUM 8.8*  --      EKG: NSR, HR 82   Code Stroke Cta Cerebral Perfusion W/wo Contrast  Result Date: 11/14/2018 CLINICAL DATA:  Slurred speech and left-sided weakness EXAM: CT ANGIOGRAPHY HEAD AND NECK CT PERFUSION BRAIN TECHNIQUE: Multidetector CT imaging of the head and neck was performed using the standard protocol during bolus administration of intravenous contrast. Multiplanar CT image reconstructions and MIPs were obtained to evaluate the vascular anatomy.  Carotid stenosis measurements (when applicable) are obtained utilizing NASCET criteria, using the distal internal carotid diameter as the denominator. Multiphase CT imaging of the brain was performed following IV bolus contrast injection. Subsequent parametric perfusion maps were calculated using RAPID software. CONTRAST:  138mL OMNIPAQUE IOHEXOL 350 MG/ML SOLN COMPARISON:  Head CT 11/13/2018 FINDINGS: CTA NECK FINDINGS SKELETON: There is no bony spinal canal stenosis. No lytic or blastic lesion.  OTHER NECK: Normal pharynx, larynx and major salivary glands. No cervical lymphadenopathy. Unremarkable thyroid gland. UPPER CHEST: No pneumothorax or pleural effusion. No nodules or masses. AORTIC ARCH: There is no calcific atherosclerosis of the aortic arch. There is no aneurysm, dissection or hemodynamically significant stenosis of the visualized ascending aorta and aortic arch. Conventional 3 vessel aortic branching pattern. The visualized proximal subclavian arteries are widely patent. RIGHT CAROTID SYSTEM: --Common carotid artery: Widely patent origin without common carotid artery dissection or aneurysm. --Internal carotid artery: Normal without aneurysm, dissection or stenosis. --External carotid artery: No acute abnormality. LEFT CAROTID SYSTEM: --Common carotid artery: Widely patent origin without common carotid artery dissection or aneurysm. --Internal carotid artery: Normal without aneurysm, dissection or stenosis. --External carotid artery: No acute abnormality. VERTEBRAL ARTERIES: Codominant configuration. Both origins are clearly patent. No dissection, occlusion or flow-limiting stenosis to the skull base (V1-V3 segments). CTA HEAD FINDINGS POSTERIOR CIRCULATION: --Vertebral arteries: Normal V4 segments. --Posterior inferior cerebellar arteries (PICA): Patent origins from the vertebral arteries. --Anterior inferior cerebellar arteries (AICA): Patent origins from the basilar artery. --Basilar artery:  Incidentally noted basilar artery fenestration. --Superior cerebellar arteries: Normal. --Posterior cerebral arteries: Normal. Both originate from the basilar artery. Posterior communicating arteries (p-comm) are diminutive or absent. ANTERIOR CIRCULATION: --Intracranial internal carotid arteries: There is mild-to-moderate narrowing of the right carotid terminus. Normal left ICA. --Anterior cerebral arteries (ACA): Normal. Both A1 segments are present. Patent anterior communicating artery (a-comm). --Middle cerebral arteries (MCA): There is severe stenosis of the distal M1 segment of the right MCA. The distal MCA branches are widely patent. The left MCA is normal. VENOUS SINUSES: As permitted by contrast timing, patent. ANATOMIC VARIANTS: None Review of the MIP images confirms the above findings. CT Brain Perfusion Findings: ASPECTS: 10 CBF (<30%) Volume: 19mL Perfusion (Tmax>6.0s) volume: 29mL Mismatch Volume: 37mL Infarction Location:None IMPRESSION: 1. No core infarct or penumbra. 2. Severe stenosis of the distal M1 segment of the right MCA with widely patent distal branches. 3. Mild-to-moderate narrowing of the right carotid terminus. 4. Normal CTA of the neck. Discussed with Kerney Elbe at 2:55 AM on 11/14/2018. Electronically Signed   By: Ulyses Jarred M.D.   On: 11/14/2018 03:06   Dg Chest Port 1 View  Result Date: 11/14/2018 CLINICAL DATA:  Weakness EXAM: PORTABLE CHEST 1 VIEW COMPARISON:  June 01, 2015 FINDINGS: The heart size and mediastinal contours are within normal limits. Both lungs are clear. The visualized skeletal structures are unremarkable. IMPRESSION: No acute cardiopulmonary process. Electronically Signed   By: Prudencio Pair M.D.   On: 11/14/2018 03:01   Ct Head Code Stroke Wo Contrast  Result Date: 11/14/2018 CLINICAL DATA:  Code stroke.  Slurred speech and left-sided weakness EXAM: CT HEAD WITHOUT CONTRAST TECHNIQUE: Contiguous axial images were obtained from the base of the skull through  the vertex without intravenous contrast. COMPARISON:  None. FINDINGS: Brain: There is no mass, hemorrhage or extra-axial collection. The size and configuration of the ventricles and extra-axial CSF spaces are normal. The brain parenchyma is normal, without evidence of acute or chronic infarction. Vascular: No abnormal hyperdensity of the major intracranial arteries or dural venous sinuses. No intracranial atherosclerosis. Skull: The visualized skull base, calvarium and extracranial soft tissues are normal. Sinuses/Orbits: No fluid levels or advanced mucosal thickening of the visualized paranasal sinuses. No mastoid or middle ear effusion. The orbits are normal. ASPECTS [facility] Stroke Program Early CT Score) - Ganglionic level infarction (caudate, lentiform nuclei, internal capsule, insula, M1-M3 cortex): 7 - Supraganglionic infarction (M4-M6  cortex): 3 Total score (0-10 with 10 being normal): 10 IMPRESSION: 1. No intracranial hemorrhage. 2. ASPECTS is 10. These results were communicated to Dr. Kerney Elbe at 2:13 am on 11/14/2018 by text page via the [facility] messaging system. Electronically Signed   By: Ulyses Jarred M.D.   On: 11/14/2018 02:14   Lyndee Hensen, MD 11/14/2018, 4:04 AM PGY-1, [facility] pager: [phone], text pages welcome  FPTS Upper-Level Resident Addendum   I have independently interviewed and examined the patient. I have discussed the above with the original author and agree with their documentation. My edits for correction/addition/clarification are in purple. Please see also any attending notes.    [place] Takeysha Bonk, DO PGY-3, [facility] 11/14/2018 6:58 AM  FPTS Service pager: [phone] (text pages welcome through [facility])

## 2018-11-14 NOTE — Consult Note (Signed)
Chief Complaint: Patient was seen in consultation today for right MCA CVA  Referring Physician(s): Dr. Cheral Marker  Supervising Physician: Luanne Bras  Patient Status: [facility] - ED  History of Present Illness: Brad Holmes is a 48 y.o. male with past medical history HLD, HTN, retinal detachment, prior heart catheterization who presented to [facility] ED with left-sided weakness, facial droop, LUE sensory loss, and left-sided headache. Patient was LKW at 10:15 when he went to bed, then awoke at 0130 with acute symptoms.  Since presenting to the ED his symptoms have resolved.   CTA Head Neck showed: 1. No core infarct or penumbra. 2. Severe stenosis of the distal M1 segment of the right MCA with widely patent distal branches. 3. Mild-to-moderate narrowing of the right carotid terminus. 4. Normal CTA of the neck.  MRI Brain showed: Brain: No acute infarction, hemorrhage, hydrocephalus, extra-axial collection or mass lesion. Normal white matter.  Vascular: Normal arterial flow voids.  Skull and upper cervical spine: Negative  Sinuses/Orbits: Mild mucosal edema paranasal sinuses.  Normal orbit.  Patient assessed at bedside this AM alongside his wife.  His symptoms have since resolved and he denies headache, weakness, or sensory loss at present. Diagnostic angiogram requested by Dr. Cheral Marker and approved by Dr. Estanislado Pandy.  Patient has been NPO.  Past Medical History:  Diagnosis Date   Hypercholesterolemia    Hypertension     Past Surgical History:  Procedure Laterality Date   EYE SURGERY      Allergies: No known allergies  Medications: Prior to Admission medications   Medication Sig Start Date End Date Taking? Authorizing Provider  aspirin EC 81 MG tablet Take 81 mg by mouth daily.   Yes [provider]  losartan (COZAAR) 25 MG tablet Take 1 tablet (25 mg total) by mouth at bedtime. 08/11/18  Yes Hensel, Jamal Collin, MD  rosuvastatin (CRESTOR) 40 MG tablet Take 1  tablet (40 mg total) by mouth daily. 08/12/18  Yes Hensel, Jamal Collin, MD     Family History  Problem Relation Age of Onset   Colon cancer Neg Hx    Colon polyps Neg Hx    Esophageal cancer Neg Hx    Rectal cancer Neg Hx    Stomach cancer Neg Hx     Social History   Socioeconomic History   Marital status: Married    Spouse name: Not on file   Number of children: Not on file   Years of education: Not on file   Highest education level: Not on file  Occupational History   Not on file  Social Needs   Financial resource strain: Not on file   Food insecurity    Worry: Not on file    Inability: Not on file   Transportation needs    Medical: Not on file    Non-medical: Not on file  Tobacco Use   Smoking status: Never Smoker   Smokeless tobacco: Never Used  Substance and Sexual Activity   Alcohol use: No   Drug use: No   Sexual activity: Not on file  Lifestyle   Physical activity    Days per week: Not on file    Minutes per session: Not on file   Stress: Not on file  Relationships   Social connections    Talks on phone: Not on file    Gets together: Not on file    Attends religious service: Not on file    Active member of club or organization: Not on file  Attends meetings of clubs or organizations: Not on file    Relationship status: Not on file  Other Topics Concern   Not on file  Social History Narrative   Not on file    Review of Systems: A 12 point ROS discussed and pertinent positives are indicated in the HPI above.  All other systems are negative.  Review of Systems  Constitutional: Negative for fatigue and fever.  Respiratory: Negative for cough, choking and shortness of breath.   Cardiovascular: Negative for chest pain.  Gastrointestinal: Negative for abdominal pain, nausea and vomiting.  Genitourinary: Negative for dysuria.  Musculoskeletal: Negative for back pain.  Neurological: Positive for facial asymmetry, weakness, numbness  and headaches.  Psychiatric/Behavioral: Negative for behavioral problems and confusion.    Vital Signs: BP (!) 147/97    Pulse 74    Temp 99.1 F (37.3 C) (Oral)    Resp 17    Ht 5\' 11"  (1.803 m)    Wt 211 lb 13.8 oz (96.1 kg)    SpO2 97%    BMI 29.55 kg/m   Physical Exam Vitals signs and nursing note reviewed.  Constitutional:      Appearance: Normal appearance.  HENT:     Mouth/Throat:     Mouth: Mucous membranes are moist.     Pharynx: Oropharynx is clear.  Cardiovascular:     Rate and Rhythm: Normal rate and regular rhythm.  Pulmonary:     Effort: Pulmonary effort is normal. No respiratory distress.     Breath sounds: Normal breath sounds.  Abdominal:     General: Abdomen is flat.     Palpations: Abdomen is soft.  Musculoskeletal: Normal range of motion.  Skin:    General: Skin is warm and dry.  Neurological:     General: No focal deficit present.     Mental Status: He is alert and oriented to person, place, and time. Mental status is at baseline.     Sensory: No sensory deficit.     Motor: No weakness.  Psychiatric:        Mood and Affect: Mood normal.        Behavior: Behavior normal.        Thought Content: Thought content normal.        Judgment: Judgment normal.      MD Evaluation Airway: WNL Heart: WNL Abdomen: WNL Chest/ Lungs: WNL ASA  Classification: 3 Mallampati/Airway Score: Three   Imaging: Ct Code Stroke Cta Head W/wo Contrast  Result Date: 11/14/2018 CLINICAL DATA:  Slurred speech and left-sided weakness EXAM: CT ANGIOGRAPHY HEAD AND NECK CT PERFUSION BRAIN TECHNIQUE: Multidetector CT imaging of the head and neck was performed using the standard protocol during bolus administration of intravenous contrast. Multiplanar CT image reconstructions and MIPs were obtained to evaluate the vascular anatomy. Carotid stenosis measurements (when applicable) are obtained utilizing NASCET criteria, using the distal internal carotid diameter as the  denominator. Multiphase CT imaging of the brain was performed following IV bolus contrast injection. Subsequent parametric perfusion maps were calculated using RAPID software. CONTRAST:  12mL OMNIPAQUE IOHEXOL 350 MG/ML SOLN COMPARISON:  Head CT 11/13/2018 FINDINGS: CTA NECK FINDINGS SKELETON: There is no bony spinal canal stenosis. No lytic or blastic lesion. OTHER NECK: Normal pharynx, larynx and major salivary glands. No cervical lymphadenopathy. Unremarkable thyroid gland. UPPER CHEST: No pneumothorax or pleural effusion. No nodules or masses. AORTIC ARCH: There is no calcific atherosclerosis of the aortic arch. There is no aneurysm, dissection or hemodynamically significant stenosis of  the visualized ascending aorta and aortic arch. Conventional 3 vessel aortic branching pattern. The visualized proximal subclavian arteries are widely patent. RIGHT CAROTID SYSTEM: --Common carotid artery: Widely patent origin without common carotid artery dissection or aneurysm. --Internal carotid artery: Normal without aneurysm, dissection or stenosis. --External carotid artery: No acute abnormality. LEFT CAROTID SYSTEM: --Common carotid artery: Widely patent origin without common carotid artery dissection or aneurysm. --Internal carotid artery: Normal without aneurysm, dissection or stenosis. --External carotid artery: No acute abnormality. VERTEBRAL ARTERIES: Codominant configuration. Both origins are clearly patent. No dissection, occlusion or flow-limiting stenosis to the skull base (V1-V3 segments). CTA HEAD FINDINGS POSTERIOR CIRCULATION: --Vertebral arteries: Normal V4 segments. --Posterior inferior cerebellar arteries (PICA): Patent origins from the vertebral arteries. --Anterior inferior cerebellar arteries (AICA): Patent origins from the basilar artery. 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 --Superior cerebellar arteries: Normal. --Posterior cerebral arteries: Normal. Both originate from  the basilar artery. Posterior communicating arteries (p-comm) are diminutive or absent. ANTERIOR CIRCULATION: --Intracranial internal carotid arteries: There is mild-to-moderate narrowing of the right carotid terminus. Normal left ICA. --Anterior cerebral arteries (ACA): Normal. Both A1 segments are present. Patent anterior communicating artery (a-comm). --Middle cerebral arteries (MCA): There is severe stenosis of the distal M1 segment of the right MCA. The distal MCA branches are widely patent. The left MCA is normal. VENOUS SINUSES: As permitted by contrast timing, patent. ANATOMIC VARIANTS: None Review of the MIP images confirms the above findings. CT Brain Perfusion Findings: ASPECTS: 10 CBF (<30%) Volume: 108mL Perfusion (Tmax>6.0s) volume: 73mL Mismatch Volume: 47mL Infarction Location:None IMPRESSION: 1. No core infarct or penumbra. 2. Severe stenosis of the distal M1 segment of the right MCA with widely patent distal branches. 3. Mild-to-moderate narrowing of the right carotid terminus. 4. Normal CTA of the neck. Discussed with Kerney Elbe at 2:55 AM on 11/14/2018. Electronically Signed   By: Ulyses Jarred M.D.   On: 11/14/2018 03:06   Ct Code Stroke Cta Neck W/wo Contrast  Result Date: 11/14/2018 CLINICAL DATA:  Slurred speech and left-sided weakness EXAM: CT ANGIOGRAPHY HEAD AND NECK CT PERFUSION BRAIN TECHNIQUE: Multidetector CT imaging of the head and neck was performed using the standard protocol during bolus administration of intravenous contrast. Multiplanar CT image reconstructions and MIPs were obtained to evaluate the vascular anatomy. Carotid stenosis measurements (when applicable) are obtained utilizing NASCET criteria, using the distal internal carotid diameter as the denominator. Multiphase CT imaging of the brain was performed following IV bolus contrast injection. Subsequent parametric perfusion maps were calculated using RAPID software. CONTRAST:  149mL OMNIPAQUE IOHEXOL 350 MG/ML SOLN  COMPARISON:  Head CT 11/13/2018 FINDINGS: CTA NECK FINDINGS SKELETON: There is no bony spinal canal stenosis. No lytic or blastic lesion. OTHER NECK: Normal pharynx, larynx and major salivary glands. No cervical lymphadenopathy. Unremarkable thyroid gland. UPPER CHEST: No pneumothorax or pleural effusion. No nodules or masses. AORTIC ARCH: There is no calcific atherosclerosis of the aortic arch. There is no aneurysm, dissection or hemodynamically significant stenosis of the visualized ascending aorta and aortic arch. Conventional 3 vessel aortic branching pattern. The visualized proximal subclavian arteries are widely patent. RIGHT CAROTID SYSTEM: --Common carotid artery: Widely patent origin without common carotid artery dissection or aneurysm. --Internal carotid artery: Normal without aneurysm, dissection or stenosis. --External carotid artery: No acute abnormality. LEFT CAROTID SYSTEM: --Common carotid artery: Widely patent origin without common carotid artery dissection or aneurysm. --Internal carotid artery: Normal without aneurysm, dissection or stenosis. --External carotid artery: No acute abnormality. VERTEBRAL ARTERIES: Codominant configuration. Both origins  are clearly patent. No dissection, occlusion or flow-limiting stenosis to the skull base (V1-V3 segments). CTA HEAD FINDINGS POSTERIOR CIRCULATION: --Vertebral arteries: Normal V4 segments. --Posterior inferior cerebellar arteries (PICA): Patent origins from the vertebral arteries. --Anterior inferior cerebellar arteries (AICA): Patent origins from the basilar artery. 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 --Superior cerebellar arteries: Normal. --Posterior cerebral arteries: Normal. Both originate from the basilar artery. Posterior communicating arteries (p-comm) are diminutive or absent. ANTERIOR CIRCULATION: --Intracranial internal carotid arteries: There is mild-to-moderate narrowing of the right carotid terminus. Normal  left ICA. --Anterior cerebral arteries (ACA): Normal. Both A1 segments are present. Patent anterior communicating artery (a-comm). --Middle cerebral arteries (MCA): There is severe stenosis of the distal M1 segment of the right MCA. The distal MCA branches are widely patent. The left MCA is normal. VENOUS SINUSES: As permitted by contrast timing, patent. ANATOMIC VARIANTS: None Review of the MIP images confirms the above findings. CT Brain Perfusion Findings: ASPECTS: 10 CBF (<30%) Volume: 46mL Perfusion (Tmax>6.0s) volume: 9mL Mismatch Volume: 39mL Infarction Location:None IMPRESSION: 1. No core infarct or penumbra. 2. Severe stenosis of the distal M1 segment of the right MCA with widely patent distal branches. 3. Mild-to-moderate narrowing of the right carotid terminus. 4. Normal CTA of the neck. Discussed with Kerney Elbe at 2:55 AM on 11/14/2018. Electronically Signed   By: Ulyses Jarred M.D.   On: 11/14/2018 03:06   Mr Brain Wo Contrast  Result Date: 11/14/2018 CLINICAL DATA:  TIA initial exam EXAM: MRI HEAD WITHOUT CONTRAST TECHNIQUE: Multiplanar, multiecho pulse sequences of the brain and surrounding structures were obtained without intravenous contrast. COMPARISON:  CTA and CT perfusion 11/14/2018 FINDINGS: Brain: No acute infarction, hemorrhage, hydrocephalus, extra-axial collection or mass lesion. Normal white matter. Vascular: Normal arterial flow voids. Skull and upper cervical spine: Negative Sinuses/Orbits: Mild mucosal edema paranasal sinuses.  Normal orbit Other: None IMPRESSION: Normal MRI brain. Electronically Signed   By: Franchot Gallo M.D.   On: 11/14/2018 07:18   Ct Code Stroke Cta Cerebral Perfusion W/wo Contrast  Result Date: 11/14/2018 CLINICAL DATA:  Slurred speech and left-sided weakness EXAM: CT ANGIOGRAPHY HEAD AND NECK CT PERFUSION BRAIN TECHNIQUE: Multidetector CT imaging of the head and neck was performed using the standard protocol during bolus administration of intravenous  contrast. Multiplanar CT image reconstructions and MIPs were obtained to evaluate the vascular anatomy. Carotid stenosis measurements (when applicable) are obtained utilizing NASCET criteria, using the distal internal carotid diameter as the denominator. Multiphase CT imaging of the brain was performed following IV bolus contrast injection. Subsequent parametric perfusion maps were calculated using RAPID software. CONTRAST:  155mL OMNIPAQUE IOHEXOL 350 MG/ML SOLN COMPARISON:  Head CT 11/13/2018 FINDINGS: CTA NECK FINDINGS SKELETON: There is no bony spinal canal stenosis. No lytic or blastic lesion. OTHER NECK: Normal pharynx, larynx and major salivary glands. No cervical lymphadenopathy. Unremarkable thyroid gland. UPPER CHEST: No pneumothorax or pleural effusion. No nodules or masses. AORTIC ARCH: There is no calcific atherosclerosis of the aortic arch. There is no aneurysm, dissection or hemodynamically significant stenosis of the visualized ascending aorta and aortic arch. Conventional 3 vessel aortic branching pattern. The visualized proximal subclavian arteries are widely patent. RIGHT CAROTID SYSTEM: --Common carotid artery: Widely patent origin without common carotid artery dissection or aneurysm. --Internal carotid artery: Normal without aneurysm, dissection or stenosis. --External carotid artery: No acute abnormality. LEFT CAROTID SYSTEM: --Common carotid artery: Widely patent origin without common carotid artery dissection or aneurysm. --Internal carotid artery: Normal without aneurysm, dissection or stenosis. --External  carotid artery: No acute abnormality. VERTEBRAL ARTERIES: Codominant configuration. Both origins are clearly patent. No dissection, occlusion or flow-limiting stenosis to the skull base (V1-V3 segments). CTA HEAD FINDINGS POSTERIOR CIRCULATION: --Vertebral arteries: Normal V4 segments. --Posterior inferior cerebellar arteries (PICA): Patent origins from the vertebral arteries. --Anterior  inferior cerebellar arteries (AICA): Patent origins from the basilar artery. 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 --Superior cerebellar arteries: Normal. --Posterior cerebral arteries: Normal. Both originate from the basilar artery. Posterior communicating arteries (p-comm) are diminutive or absent. ANTERIOR CIRCULATION: --Intracranial internal carotid arteries: There is mild-to-moderate narrowing of the right carotid terminus. Normal left ICA. --Anterior cerebral arteries (ACA): Normal. Both A1 segments are present. Patent anterior communicating artery (a-comm). --Middle cerebral arteries (MCA): There is severe stenosis of the distal M1 segment of the right MCA. The distal MCA branches are widely patent. The left MCA is normal. VENOUS SINUSES: As permitted by contrast timing, patent. ANATOMIC VARIANTS: None Review of the MIP images confirms the above findings. CT Brain Perfusion Findings: ASPECTS: 10 CBF (<30%) Volume: 8mL Perfusion (Tmax>6.0s) volume: 62mL Mismatch Volume: 80mL Infarction Location:None IMPRESSION: 1. No core infarct or penumbra. 2. Severe stenosis of the distal M1 segment of the right MCA with widely patent distal branches. 3. Mild-to-moderate narrowing of the right carotid terminus. 4. Normal CTA of the neck. Discussed with Kerney Elbe at 2:55 AM on 11/14/2018. Electronically Signed   By: Ulyses Jarred M.D.   On: 11/14/2018 03:06   Dg Chest Port 1 View  Result Date: 11/14/2018 CLINICAL DATA:  Weakness EXAM: PORTABLE CHEST 1 VIEW COMPARISON:  June 01, 2015 FINDINGS: The heart size and mediastinal contours are within normal limits. Both lungs are clear. The visualized skeletal structures are unremarkable. IMPRESSION: No acute cardiopulmonary process. Electronically Signed   By: Prudencio Pair M.D.   On: 11/14/2018 03:01   Ct Head Code Stroke Wo Contrast  Result Date: 11/14/2018 CLINICAL DATA:  Code stroke.  Slurred speech and left-sided weakness EXAM: CT HEAD  WITHOUT CONTRAST TECHNIQUE: Contiguous axial images were obtained from the base of the skull through the vertex without intravenous contrast. COMPARISON:  None. FINDINGS: Brain: There is no mass, hemorrhage or extra-axial collection. The size and configuration of the ventricles and extra-axial CSF spaces are normal. The brain parenchyma is normal, without evidence of acute or chronic infarction. Vascular: No abnormal hyperdensity of the major intracranial arteries or dural venous sinuses. No intracranial atherosclerosis. Skull: The visualized skull base, calvarium and extracranial soft tissues are normal. Sinuses/Orbits: No fluid levels or advanced mucosal thickening of the visualized paranasal sinuses. No mastoid or middle ear effusion. The orbits are normal. ASPECTS [facility] Stroke Program Early CT Score) - Ganglionic level infarction (caudate, lentiform nuclei, internal capsule, insula, M1-M3 cortex): 7 - Supraganglionic infarction (M4-M6 cortex): 3 Total score (0-10 with 10 being normal): 10 IMPRESSION: 1. No intracranial hemorrhage. 2. ASPECTS is 10. These results were communicated to Dr. Kerney Elbe at 2:13 am on 11/14/2018 by text page via the [facility] messaging system. Electronically Signed   By: Ulyses Jarred M.D.   On: 11/14/2018 02:14    Labs:  CBC: Recent Labs    02/14/18 2240 08/11/18 0942 11/14/18 0248 11/14/18 0330  WBC 8.2 5.9 6.4  --   HGB 13.9 15.5 13.7 13.3  HCT 42.8 44.7 40.5 39.0  PLT 257 286 244  --     COAGS: Recent Labs    11/14/18 0248  INR 0.9  APTT 28    BMP: Recent Labs  02/14/18 2240 08/11/18 0942 11/14/18 0248 11/14/18 0330  NA 136 138 136 138  K 3.7 4.5 3.9 3.8  CL 104 101 106 104  CO2 26 25 22   --   GLUCOSE 104* 86 107* 98  BUN 19 10 12 13   CALCIUM 9.0 10.2 8.8*  --   CREATININE 0.96 0.92 0.97 0.90  GFRNONAA >60 97 >60  --   GFRAA >60 112 >60  --     LIVER FUNCTION TESTS: Recent Labs    02/10/18 1116 08/11/18 0942 11/14/18 0248    BILITOT 0.5 0.4 0.5  AST 21 36 21  ALT 35 89* 35  ALKPHOS  --  91 63  PROT 6.9 6.8 5.8*  ALBUMIN  --  4.5 3.4*    TUMOR MARKERS: No results for input(s): AFPTM, CEA, CA199, CHROMGRNA in the last 8760 hours.  Assessment and Plan: M1 stenosis Patient with history of HTN, HLD, prior heart cath who awoke overnight with new onset left-sided weakness, LUE numbness, and L headache.  CT Head Neck shows severe stenosis of distal M1 suggesting acute R MCA CVA.  His symptoms have resolved.  NIR consulted for diagnostic angiogram.  Patient has been NPO.  He is not on blood thinners.   Risks and benefits were discussed with the patient including, but not limited to bleeding, infection, vascular injury or contrast induced renal failure.  This interventional procedure involves the use of X-rays and because of the nature of the planned procedure, it is possible that we will have prolonged use of X-ray fluoroscopy.  Potential radiation risks to you include (but are not limited to) the following: - A slightly elevated risk for cancer  several years later in life. This risk is typically less than 0.5% percent. This risk is low in comparison to the normal incidence of human cancer, which is 33% for women and 50% for men according to the [employer]. - Radiation induced injury can include skin redness, resembling a rash, tissue breakdown / ulcers and hair loss (which can be temporary or permanent).   The likelihood of either of these occurring depends on the difficulty of the procedure and whether you are sensitive to radiation due to previous procedures, disease, or genetic conditions.   IF your procedure requires a prolonged use of radiation, you will be notified and given written instructions for further action.  It is your responsibility to monitor the irradiated area for the 2 weeks following the procedure and to notify your physician if you are concerned that you have suffered a  radiation induced injury.    All of the patient's questions were answered, patient is agreeable to proceed.  Consent signed and in chart.  Thank you for this interesting consult.  I greatly enjoyed meeting ARMIN BRESSON and look forward to participating in their care.  A copy of this report was sent to the requesting provider on this date.  Electronically Signed: Docia Barrier, PA 11/14/2018, 8:33 AM   I spent a total of 40 Minutes    in face to face in clinical consultation, greater than 50% of which was counseling/coordinating care for R MCA CVA.

## 2018-11-14 NOTE — Consult Note (Signed)
Referring Physician: Dr. Christy Gentles    Chief Complaint: Acute onset of left sided weakness with dysarthria and left facial droop.   HPI: Brad Holmes is an 48 y.o. male who was LKN at 10:15 PM prior to going to sleep. He awoke at 0130 with a headache and noted left sided weakness. His headache was occipital with radiation to the left retroorbitally. On arrival to the ED, he was dysarthric with left facial droop and left arm > leg weakness. He has a history of migraines in the past, but has never had a complex migraine headache. He takes ASA daily and has stroke risk factors of hypercholesterolemia and HTN. No prior history of stroke or MI. 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   LSN: 10:15 PM tPA Given: No: Symptoms and signs significantly improving after CT with minor deficits. After CTA exam completely normalized.   Past Medical History:  Diagnosis Date  . Hypercholesterolemia   . Hypertension     Past Surgical History:  Procedure Laterality Date  . EYE SURGERY      Family History  Problem Relation Age of Onset  . Colon cancer Neg Hx   . Colon polyps Neg Hx   . Esophageal cancer Neg Hx   . Rectal cancer Neg Hx   . Stomach cancer Neg Hx    Social History:  reports that he has never smoked. He has never used smokeless tobacco. He reports that he does not drink alcohol or use drugs.  Allergies:  Allergies  Allergen Reactions  . No Known Allergies     Home Medications:  ASA  Losartan Rosuvastatin  ROS: As per HPI. Comprehensive ROS otherwise negative.   Physical Examination: Blood pressure (!) 146/86, pulse 93, temperature 99.1 F (37.3 C), temperature source Oral, SpO2 100 %.  HEENT: [place]/AT Lungs: Respirations unlabored Ext: No edema  Neurologic Examination: Mental Status: Alert, oriented, thought content appropriate.  Speech fluent without evidence of aphasia. Dysarthria noted.  Able to follow all commands without difficulty. No anosognosia or neglect.  Cranial Nerves: II:  Visual fields intact bilaterally. Left pupil 3 mm >> 2 mm. Right pupil 4 mm >> 3 mm III,IV, VI: No ptosis. EOMI without nystagmus. V,VII: Mild left facial droop. Facial temp sensation equal bilaterally VIII: hearing intact to conversation IX,X: No hypophonia XI: Slight lag on the left with shoulder shrug XII: midline tongue extension  Motor: RUE and RLE 5/5 LUE initially 2-3/5 with rapid improvement to 5/5 after CT head LLE initally 4/5 with rapid improvement to 4+/5 after CT head Initially with drift in LUE and LLE, worse in arm, that resolved after CT head Sensory: Decreased temp sensation LUE. Temp sensation normal BLE and RUE. FT intact with no extinction to DSS Deep Tendon Reflexes:  3+ left brachioradialis and biceps.  4+ bilateral patellae (crossed adductors) 2+ bilateral achilles Right toe downgoing, left toe upgoing.  Cerebellar: No ataxia with FNF on right, but unable to perform on left initially. After CT was able to perform on left with no ataxia.  Gait: Deferred  Follow up exams:  Following CT head, there was significant improvement in his left sided weakness and facial droop. Following CTA/CTP, exam completely normalized.   No results found for this or any previous visit (from the past 48 hour(s)). Ct Head Code Stroke Wo Contrast  Result Date: 11/14/2018 CLINICAL DATA:  Code stroke.  Slurred speech and left-sided weakness EXAM:  CT HEAD WITHOUT CONTRAST TECHNIQUE: Contiguous axial images were obtained from the base of the skull through the vertex without intravenous contrast. COMPARISON:  None. FINDINGS: Brain: There is no mass, hemorrhage or extra-axial collection. The size and configuration of the ventricles and extra-axial CSF spaces are normal. The brain parenchyma is normal, without evidence of acute or chronic infarction. Vascular: No abnormal hyperdensity of the major  intracranial arteries or dural venous sinuses. No intracranial atherosclerosis. Skull: The visualized skull base, calvarium and extracranial soft tissues are normal. Sinuses/Orbits: No fluid levels or advanced mucosal thickening of the visualized paranasal sinuses. No mastoid or middle ear effusion. The orbits are normal. ASPECTS [facility] Stroke Program Early CT Score) - Ganglionic level infarction (caudate, lentiform nuclei, internal capsule, insula, M1-M3 cortex): 7 - Supraganglionic infarction (M4-M6 cortex): 3 Total score (0-10 with 10 being normal): 10 IMPRESSION: 1. No intracranial hemorrhage. 2. ASPECTS is 10. These results were communicated to Dr. Kerney Elbe at 2:13 am on 11/14/2018 by text page via the [facility] messaging system. Electronically Signed   By: Ulyses Jarred M.D.   On: 11/14/2018 02:14    Assessment: 48 y.o. male with acute onset of left sided weakness, facial droop, LUE sensory loss followed a few minutes later by onset of severe occipital and left retroorbital headache. Most likely etiology is right M1 in situ thrombosis versus embolus with near-occlusion 1. DDx for presentation primarily complicated migraine versus right hemispheric stroke with rapid improvement and incidental accompanying headache.  2. CT head negative.  3. CTA head and neck with CTP: No core infarct or penumbra. Severe stenosis of the distal M1 segment of the right MCA with widely patent distal branches. Mild-to-moderate narrowing of the right carotid terminus. Normal CTA of the neck. 4. Stroke Risk Factors - hypercholesterolemia and HTN  Plan: 1. Four vessel catheter-based angiogram in the AM with Dr. Estanislado Pandy. Keep NPO.  2. MRI of the brain without contrast 3. PT consult, OT consult, Speech consult 4. Echocardiogram 5. HgbA1c, fasting lipid panel 6. Crushed ASA 650 mg x 1 administered. IV NS bolus 1 L x 1 followed by infusion rate of 125 cc/hr 7. Risk factor modification 8. Telemetry monitoring 9.  Frequent neuro checks 10. May need to be started on DAPT with addition of Plavix to ASA pending results of work up 11. TIA alert with frequent neuro checks.  12. Switch rosuvastatin to 80 mg po qd of atorvastatin 13. Permissive HTN protocol. Correct BP if SBP is > [id number] or diastolic is > 123456  60 minutes spent in the emergent neurological evaluation and management of this critically ill patient with severe right M1 stenosis, most likely secondary to acute thrombosis versus embolization from proximal source  @Electronically  signed: Dr. Kerney Elbe  11/14/2018, 2:29 AM

## 2018-11-14 NOTE — ED Triage Notes (Signed)
Patient presents with slurred speech , left facial droop, left arm drift with weakness onset 0130 am this morning , Code stroke activated , seen by Dr. Christy Gentles at triage and cleared for CT scan . Spouse added he fell from bed this morning .

## 2018-11-14 NOTE — ED Notes (Signed)
ED Provider at bedside. 

## 2018-11-14 NOTE — Discharge Summary (Addendum)
[facility] Discharge Summary  Patient name: Brad Holmes Medical record number: [id number] Date of birth: Mar 29, 1970 Age: 48 y.o. Gender: male Date of Admission: 11/14/2018  Date of Discharge: 11/15/2018 Admitting Physician: Martyn Malay, MD  Primary Care Provider: Dickie La, MD Consultants: Neurology, IR  Indication for Hospitalization: Left-sided weakness  Discharge Diagnoses/Problem List:  CVA Right M1 stenosis Hypertension Hyperlipidemia Familial hypercholesterolemia History of migraines  Disposition: Home  Discharge Condition: Stable  Discharge Exam:  General: NAD, pleasant Eyes: PERRL, EOMI, no conjunctival pallor or injection Cardiovascular: RRR, no m/r/g, no LE edema Respiratory: CTA BL, normal work of breathing MSK: moves 4 extremities equally Derm: no rashes appreciated Neuro: CN II-XII grossly intact, no focal deficits noted Psych: AOx3, appropriate affect  Brief Hospital Course:  Brad Holmes is a 48 y.o. male who presented with acute onset left-sided weakness difficulty speaking. Patient reported he was lying in his bed when he felt short of breath and tried to get out of bed but was unable to.  Wife noted that he had a left-sided facial droop as well as left upper extremity weakness. She immediately drove him to the ED. Presentation to the ED he was noted to have dysarthria as well as left-sided facial droop and left arm and leg weakness. Neurology was consulted and after CT head patient had an significant improvement in his symptoms and his symptoms completely normalized after following CTA so tPA was not given.  CT head was negative for bleed. CTA head showed severe stenosis of the distal M1 segment of the right MCA with patent distal branches as well as mild to moderate narrowing of the right carotid terminus.  Normal CTA of the neck.  Patient was given aspirin 650 mg x 1. Lab work-up was grossly normal and UDS was negative.  Risk  stratification labs were obtained with patient having improved lipid panel from previous office visit as seen below.  However patient may also require a PCSK9 inhibitor A1c 5.5.    Neurology also recommended patient have coronary angiogram with interventional radiology on 11/14/2018 which showed high-grade stenosis of right MCA distal right MCA M1 segment approximately 85 to 90% as well as approximately 65 to 70% stenosis of right ICA supraclinoid region.  Given these findings it was recommended that patient continue with aspirin 325 and Plavix 75 for 3 months and then Plavix alone due to his intracranial stenosis.  It was recommended that he have a repeat angiogram in 2 to 3 weeks to reevaluate his right M1 stenosis and may consider right M1 stenting when appropriate.  Patient was allowed permissive hypertension while admitted and his losartan was held.  His long-term blood pressure goal is [id number] systolic given his tandem stenosis.  During admission patient experienced episode of chest pain and given that he was high risk troponins were obtained which trended negative and EKG showed NSR.  Prior to discharge patient was stable and understood plan.  He was counseled on further risk stratification with recommending weight loss with diet and exercise as appropriate.  Issues for Follow Up:  1. Continue ASA 325 and plavix 75mg  daily for 3 months and then plavix alone due to intracranial stenosis 2. Long-term BP goal 130-150 given tandem stenosis of right ICA terminus and right M1, losartan discontinued at discharge.  3. Recommend repeat angiogram in 2-3 weeks to re-evaluate right M1 stenosis 4. Patient to continue rosuvastatin 40 mg and if remains uncontrolled, please start with PCSK9 inhibitors if  needed as outpt 5. Please ensure follow up with stroke clinic NP at [facility] in about 4 weeks. 6. Please ensure patient has scheduled follow up appointment with Dr. Estanislado Pandy for repeat Angiogram when he follows up on  9/17  Significant Procedures:  Four-vessel cerebral angiogram on 11/14/2018  Significant Labs and Imaging:  Recent Labs  Lab 11/14/18 0248 11/14/18 0330 11/15/18 0335  WBC 6.4  --  7.6  HGB 13.7 13.3 14.6  HCT 40.5 39.0 43.3  PLT 244  --  256   Recent Labs  Lab 11/14/18 0248 11/14/18 0330 11/15/18 0335  NA 136 138 139  K 3.9 3.8 4.1  CL 106 104 108  CO2 22  --  24  GLUCOSE 107* 98 86  BUN 12 13 7   CREATININE 0.97 0.90 0.89  CALCIUM 8.8*  --  9.1  ALKPHOS 63  --   --   AST 21  --   --   ALT 35  --   --   ALBUMIN 3.4*  --   --    Hs Troponin 7>6>6 Hemoglobin A1c 5.5 UDS negative  COVID-19 negative  Lipid Panel     Component Value Date/Time   CHOL 212 (H) 11/14/2018 1758   CHOL 361 (H) 08/11/2018 0942   TRIG 171 (H) 11/14/2018 1758   HDL 33 (L) 11/14/2018 1758   HDL 40 08/11/2018 0942   CHOLHDL 6.4 11/14/2018 1758   VLDL 34 11/14/2018 1758   LDLCALC 145 (H) 11/14/2018 1758   LDLCALC 260 (H) 08/11/2018 0942   - Code Stroke CT head 11/13/2018: No acute abnormality.     - CTA head 11/13/2018: No core infarct or penumbra. Severe stenosis of the distal M1 segment of the right MCA with widely patent distal branches. Mild-to-moderate narrowing of the right carotid terminus.  - CTA neck 11/13/2018: normal  - CT perfusion 11/13/2018: no core infarct or penumbra  - MRI brain 11/14/2018: small hyperintensity in the right corona radiata on diffusion weighted imaging. This measures approximately 4 x 6 mm and may represent acute or subacute infarction.  - Cerebral angiogram 11/14/2018: 1.High grade stenosis of RT MCA distal RT MCA M1 seg.(approx 85 to 90 %). Approx 65 to 70 % stenosis of RT ICA supraclinoid region.  ECHO:   1. The left ventricle has normal systolic function, with an ejection fraction of 55-60%. The cavity size was normal. Left ventricular diastolic parameters were normal. No evidence of left ventricular regional wall motion abnormalities.  2. The right  ventricle has normal systolic function. The cavity was normal. There is no increase in right ventricular wall thickness. Right ventricular systolic pressure could not be assessed.  3. The aorta is normal unless otherwise noted.  Results/Tests Pending at Time of Discharge: None  Discharge Medications:  Allergies as of 11/15/2018      Reactions   No Known Allergies       Medication List    STOP taking these medications   losartan 25 MG tablet Commonly known as: COZAAR     TAKE these medications   aspirin 325 MG EC tablet Take 1 tablet (325 mg total) by mouth daily. What changed:   medication strength  how much to take   clopidogrel 75 MG tablet Commonly known as: PLAVIX Take 1 tablet (75 mg total) by mouth daily. Start taking on: November 16, 2018   rosuvastatin 40 MG tablet Commonly known as: Crestor Take 1 tablet (40 mg total) by mouth daily.  Discharge Instructions: Please refer to Patient Instructions section of EMR for full details.  Patient was counseled important signs and symptoms that should prompt return to medical care, changes in medications, dietary instructions, activity restrictions, and follow up appointments.   Follow-Up Appointments: Follow-up Information    [facility]. Schedule an appointment as soon as possible for a visit in 4 week(s).   Specialty: Neurology Contact information: [address] [place] [phone]       Luanne Bras, MD. Schedule an appointment as soon as possible for a visit in 3 week(s).   Specialties: Interventional Radiology, Radiology Contact information: [place]  [id number] [phone]           Shirley, [place], DO 11/15/2018, 8:05 AM PGY-3, [place]

## 2018-11-14 NOTE — Progress Notes (Addendum)
STROKE TEAM PROGRESS NOTE   INTERVAL HISTORY Pt lying in bed, no complains. Symptoms resolved. Had cerebral angiogram today showed right distal M1 stenosis and right ICA siphon stenosis also.   Vitals:   11/14/18 1115 11/14/18 1130 11/14/18 1200 11/14/18 1230  BP:  126/82 127/88 (!) 143/69  Pulse: 80 73 72 71  Resp: 12 14 13 16   Temp:      TempSrc:      SpO2: 99% 97% 97% 98%  Weight:      Height:        CBC:  Recent Labs  Lab 11/14/18 0248 11/14/18 0330  WBC 6.4  --   NEUTROABS 3.7  --   HGB 13.7 13.3  HCT 40.5 39.0  MCV 88.6  --   PLT 244  --     Basic Metabolic Panel:  Recent Labs  Lab 11/14/18 0248 11/14/18 0330  NA 136 138  K 3.9 3.8  CL 106 104  CO2 22  --   GLUCOSE 107* 98  BUN 12 13  CREATININE 0.97 0.90  CALCIUM 8.8*  --    Lipid Panel:     Component Value Date/Time   CHOL 361 (H) 08/11/2018 0942   TRIG 303 (H) 08/11/2018 0942   HDL 40 08/11/2018 0942   CHOLHDL 9.0 (H) 08/11/2018 0942   CHOLHDL 6.7 05/11/2009 0900   VLDL 7 05/11/2009 0900   LDLCALC 260 (H) 08/11/2018 0942   HgbA1c:  Lab Results  Component Value Date   HGBA1C  05/11/2009    5.6 (NOTE) The ADA recommends the following therapeutic goal for glycemic control related to Hgb A1c measurement: Goal of therapy: <6.5 Hgb A1c  Reference: American Diabetes Association: Clinical Practice Recommendations 2010, Diabetes Care, 2010, 33: (Suppl  1).   Urine Drug Screen:     Component Value Date/Time   LABOPIA NONE DETECTED 11/14/2018 0338   COCAINSCRNUR NONE DETECTED 11/14/2018 0338   LABBENZ NONE DETECTED 11/14/2018 0338   AMPHETMU NONE DETECTED 11/14/2018 0338   THCU NONE DETECTED 11/14/2018 0338   LABBARB NONE DETECTED 11/14/2018 0338    Alcohol Level     Component Value Date/Time   ETH <10 11/14/2018 0248    IMAGING Ct Code Stroke Cta Head W/wo Contrast  Result Date: 11/14/2018 CLINICAL DATA:  Slurred speech and left-sided weakness EXAM: CT ANGIOGRAPHY HEAD AND NECK CT  PERFUSION BRAIN TECHNIQUE: Multidetector CT imaging of the head and neck was performed using the standard protocol during bolus administration of intravenous contrast. Multiplanar CT image reconstructions and MIPs were obtained to evaluate the vascular anatomy. Carotid stenosis measurements (when applicable) are obtained utilizing NASCET criteria, using the distal internal carotid diameter as the denominator. Multiphase CT imaging of the brain was performed following IV bolus contrast injection. Subsequent parametric perfusion maps were calculated using RAPID software. CONTRAST:  170mL OMNIPAQUE IOHEXOL 350 MG/ML SOLN COMPARISON:  Head CT 11/13/2018 FINDINGS: CTA NECK FINDINGS SKELETON: There is no bony spinal canal stenosis. No lytic or blastic lesion. OTHER NECK: Normal pharynx, larynx and major salivary glands. No cervical lymphadenopathy. Unremarkable thyroid gland. UPPER CHEST: No pneumothorax or pleural effusion. No nodules or masses. AORTIC ARCH: There is no calcific atherosclerosis of the aortic arch. There is no aneurysm, dissection or hemodynamically significant stenosis of the visualized ascending aorta and aortic arch. Conventional 3 vessel aortic branching pattern. The visualized proximal subclavian arteries are widely patent. RIGHT CAROTID SYSTEM: --Common carotid artery: Widely patent origin without common carotid artery dissection or aneurysm. --Internal carotid artery:  Normal without aneurysm, dissection or stenosis. --External carotid artery: No acute abnormality. LEFT CAROTID SYSTEM: --Common carotid artery: Widely patent origin without common carotid artery dissection or aneurysm. --Internal carotid artery: Normal without aneurysm, dissection or stenosis. --External carotid artery: No acute abnormality. VERTEBRAL ARTERIES: Codominant configuration. Both origins are clearly patent. No dissection, occlusion or flow-limiting stenosis to the skull base (V1-V3 segments). CTA HEAD FINDINGS POSTERIOR  CIRCULATION: --Vertebral arteries: Normal V4 segments. --Posterior inferior cerebellar arteries (PICA): Patent origins from the vertebral arteries. --Anterior inferior cerebellar arteries (AICA): Patent origins from the basilar artery. 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 --Superior cerebellar arteries: Normal. --Posterior cerebral arteries: Normal. Both originate from the basilar artery. Posterior communicating arteries (p-comm) are diminutive or absent. ANTERIOR CIRCULATION: --Intracranial internal carotid arteries: There is mild-to-moderate narrowing of the right carotid terminus. Normal left ICA. --Anterior cerebral arteries (ACA): Normal. Both A1 segments are present. Patent anterior communicating artery (a-comm). --Middle cerebral arteries (MCA): There is severe stenosis of the distal M1 segment of the right MCA. The distal MCA branches are widely patent. The left MCA is normal. VENOUS SINUSES: As permitted by contrast timing, patent. ANATOMIC VARIANTS: None Review of the MIP images confirms the above findings. CT Brain Perfusion Findings: ASPECTS: 10 CBF (<30%) Volume: 24mL Perfusion (Tmax>6.0s) volume: 67mL Mismatch Volume: 57mL Infarction Location:None IMPRESSION: 1. No core infarct or penumbra. 2. Severe stenosis of the distal M1 segment of the right MCA with widely patent distal branches. 3. Mild-to-moderate narrowing of the right carotid terminus. 4. Normal CTA of the neck. Discussed with Kerney Elbe at 2:55 AM on 11/14/2018. Electronically Signed   By: Ulyses Jarred M.D.   On: 11/14/2018 03:06   Ct Code Stroke Cta Neck W/wo Contrast  Result Date: 11/14/2018 CLINICAL DATA:  Slurred speech and left-sided weakness EXAM: CT ANGIOGRAPHY HEAD AND NECK CT PERFUSION BRAIN TECHNIQUE: Multidetector CT imaging of the head and neck was performed using the standard protocol during bolus administration of intravenous contrast. Multiplanar CT image reconstructions and MIPs were obtained  to evaluate the vascular anatomy. Carotid stenosis measurements (when applicable) are obtained utilizing NASCET criteria, using the distal internal carotid diameter as the denominator. Multiphase CT imaging of the brain was performed following IV bolus contrast injection. Subsequent parametric perfusion maps were calculated using RAPID software. CONTRAST:  119mL OMNIPAQUE IOHEXOL 350 MG/ML SOLN COMPARISON:  Head CT 11/13/2018 FINDINGS: CTA NECK FINDINGS SKELETON: There is no bony spinal canal stenosis. No lytic or blastic lesion. OTHER NECK: Normal pharynx, larynx and major salivary glands. No cervical lymphadenopathy. Unremarkable thyroid gland. UPPER CHEST: No pneumothorax or pleural effusion. No nodules or masses. AORTIC ARCH: There is no calcific atherosclerosis of the aortic arch. There is no aneurysm, dissection or hemodynamically significant stenosis of the visualized ascending aorta and aortic arch. Conventional 3 vessel aortic branching pattern. The visualized proximal subclavian arteries are widely patent. RIGHT CAROTID SYSTEM: --Common carotid artery: Widely patent origin without common carotid artery dissection or aneurysm. --Internal carotid artery: Normal without aneurysm, dissection or stenosis. --External carotid artery: No acute abnormality. LEFT CAROTID SYSTEM: --Common carotid artery: Widely patent origin without common carotid artery dissection or aneurysm. --Internal carotid artery: Normal without aneurysm, dissection or stenosis. --External carotid artery: No acute abnormality. VERTEBRAL ARTERIES: Codominant configuration. Both origins are clearly patent. No dissection, occlusion or flow-limiting stenosis to the skull base (V1-V3 segments). CTA HEAD FINDINGS POSTERIOR CIRCULATION: --Vertebral arteries: Normal V4 segments. --Posterior inferior cerebellar arteries (PICA): Patent origins from the vertebral arteries. --Anterior inferior cerebellar arteries (  AICA): Patent origins from the basilar  artery. 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 --Superior cerebellar arteries: Normal. --Posterior cerebral arteries: Normal. Both originate from the basilar artery. Posterior communicating arteries (p-comm) are diminutive or absent. ANTERIOR CIRCULATION: --Intracranial internal carotid arteries: There is mild-to-moderate narrowing of the right carotid terminus. Normal left ICA. --Anterior cerebral arteries (ACA): Normal. Both A1 segments are present. Patent anterior communicating artery (a-comm). --Middle cerebral arteries (MCA): There is severe stenosis of the distal M1 segment of the right MCA. The distal MCA branches are widely patent. The left MCA is normal. VENOUS SINUSES: As permitted by contrast timing, patent. ANATOMIC VARIANTS: None Review of the MIP images confirms the above findings. CT Brain Perfusion Findings: ASPECTS: 10 CBF (<30%) Volume: 90mL Perfusion (Tmax>6.0s) volume: 48mL Mismatch Volume: 73mL Infarction Location:None IMPRESSION: 1. No core infarct or penumbra. 2. Severe stenosis of the distal M1 segment of the right MCA with widely patent distal branches. 3. Mild-to-moderate narrowing of the right carotid terminus. 4. Normal CTA of the neck. Discussed with Kerney Elbe at 2:55 AM on 11/14/2018. Electronically Signed   By: Ulyses Jarred M.D.   On: 11/14/2018 03:06   Mr Brain Wo Contrast  Addendum Date: 11/14/2018   ADDENDUM REPORT: 11/14/2018 08:53 ADDENDUM: After further review and discussion with Dr. Erlinda Hong, there is a small hyperintensity in the right corona radiata on diffusion weighted imaging. This measures approximately 4 x 6 mm and may represent acute or subacute infarction. Electronically Signed   By: Franchot Gallo M.D.   On: 11/14/2018 08:53   Result Date: 11/14/2018 CLINICAL DATA:  TIA initial exam EXAM: MRI HEAD WITHOUT CONTRAST TECHNIQUE: Multiplanar, multiecho pulse sequences of the brain and surrounding structures were obtained without intravenous  contrast. COMPARISON:  CTA and CT perfusion 11/14/2018 FINDINGS: Brain: No acute infarction, hemorrhage, hydrocephalus, extra-axial collection or mass lesion. Normal white matter. Vascular: Normal arterial flow voids. Skull and upper cervical spine: Negative Sinuses/Orbits: Mild mucosal edema paranasal sinuses.  Normal orbit Other: None IMPRESSION: Normal MRI brain. Electronically Signed: By: Franchot Gallo M.D. On: 11/14/2018 07:18   Ct Code Stroke Cta Cerebral Perfusion W/wo Contrast  Result Date: 11/14/2018 CLINICAL DATA:  Slurred speech and left-sided weakness EXAM: CT ANGIOGRAPHY HEAD AND NECK CT PERFUSION BRAIN TECHNIQUE: Multidetector CT imaging of the head and neck was performed using the standard protocol during bolus administration of intravenous contrast. Multiplanar CT image reconstructions and MIPs were obtained to evaluate the vascular anatomy. Carotid stenosis measurements (when applicable) are obtained utilizing NASCET criteria, using the distal internal carotid diameter as the denominator. Multiphase CT imaging of the brain was performed following IV bolus contrast injection. Subsequent parametric perfusion maps were calculated using RAPID software. CONTRAST:  170mL OMNIPAQUE IOHEXOL 350 MG/ML SOLN COMPARISON:  Head CT 11/13/2018 FINDINGS: CTA NECK FINDINGS SKELETON: There is no bony spinal canal stenosis. No lytic or blastic lesion. OTHER NECK: Normal pharynx, larynx and major salivary glands. No cervical lymphadenopathy. Unremarkable thyroid gland. UPPER CHEST: No pneumothorax or pleural effusion. No nodules or masses. AORTIC ARCH: There is no calcific atherosclerosis of the aortic arch. There is no aneurysm, dissection or hemodynamically significant stenosis of the visualized ascending aorta and aortic arch. Conventional 3 vessel aortic branching pattern. The visualized proximal subclavian arteries are widely patent. RIGHT CAROTID SYSTEM: --Common carotid artery: Widely patent origin without  common carotid artery dissection or aneurysm. --Internal carotid artery: Normal without aneurysm, dissection or stenosis. --External carotid artery: No acute abnormality. LEFT CAROTID SYSTEM: --Common carotid artery: Widely  patent origin without common carotid artery dissection or aneurysm. --Internal carotid artery: Normal without aneurysm, dissection or stenosis. --External carotid artery: No acute abnormality. VERTEBRAL ARTERIES: Codominant configuration. Both origins are clearly patent. No dissection, occlusion or flow-limiting stenosis to the skull base (V1-V3 segments). CTA HEAD FINDINGS POSTERIOR CIRCULATION: --Vertebral arteries: Normal V4 segments. --Posterior inferior cerebellar arteries (PICA): Patent origins from the vertebral arteries. --Anterior inferior cerebellar arteries (AICA): Patent origins from the basilar artery. 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 --Superior cerebellar arteries: Normal. --Posterior cerebral arteries: Normal. Both originate from the basilar artery. Posterior communicating arteries (p-comm) are diminutive or absent. ANTERIOR CIRCULATION: --Intracranial internal carotid arteries: There is mild-to-moderate narrowing of the right carotid terminus. Normal left ICA. --Anterior cerebral arteries (ACA): Normal. Both A1 segments are present. Patent anterior communicating artery (a-comm). --Middle cerebral arteries (MCA): There is severe stenosis of the distal M1 segment of the right MCA. The distal MCA branches are widely patent. The left MCA is normal. VENOUS SINUSES: As permitted by contrast timing, patent. ANATOMIC VARIANTS: None Review of the MIP images confirms the above findings. CT Brain Perfusion Findings: ASPECTS: 10 CBF (<30%) Volume: 58mL Perfusion (Tmax>6.0s) volume: 9mL Mismatch Volume: 49mL Infarction Location:None IMPRESSION: 1. No core infarct or penumbra. 2. Severe stenosis of the distal M1 segment of the right MCA with widely patent distal  branches. 3. Mild-to-moderate narrowing of the right carotid terminus. 4. Normal CTA of the neck. Discussed with Kerney Elbe at 2:55 AM on 11/14/2018. Electronically Signed   By: Ulyses Jarred M.D.   On: 11/14/2018 03:06   Dg Chest Port 1 View  Result Date: 11/14/2018 CLINICAL DATA:  Weakness EXAM: PORTABLE CHEST 1 VIEW COMPARISON:  June 01, 2015 FINDINGS: The heart size and mediastinal contours are within normal limits. Both lungs are clear. The visualized skeletal structures are unremarkable. IMPRESSION: No acute cardiopulmonary process. Electronically Signed   By: Prudencio Pair M.D.   On: 11/14/2018 03:01   Ct Head Code Stroke Wo Contrast  Result Date: 11/14/2018 CLINICAL DATA:  Code stroke.  Slurred speech and left-sided weakness EXAM: CT HEAD WITHOUT CONTRAST TECHNIQUE: Contiguous axial images were obtained from the base of the skull through the vertex without intravenous contrast. COMPARISON:  None. FINDINGS: Brain: There is no mass, hemorrhage or extra-axial collection. The size and configuration of the ventricles and extra-axial CSF spaces are normal. The brain parenchyma is normal, without evidence of acute or chronic infarction. Vascular: No abnormal hyperdensity of the major intracranial arteries or dural venous sinuses. No intracranial atherosclerosis. Skull: The visualized skull base, calvarium and extracranial soft tissues are normal. Sinuses/Orbits: No fluid levels or advanced mucosal thickening of the visualized paranasal sinuses. No mastoid or middle ear effusion. The orbits are normal. ASPECTS [facility] Stroke Program Early CT Score) - Ganglionic level infarction (caudate, lentiform nuclei, internal capsule, insula, M1-M3 cortex): 7 - Supraganglionic infarction (M4-M6 cortex): 3 Total score (0-10 with 10 being normal): 10 IMPRESSION: 1. No intracranial hemorrhage. 2. ASPECTS is 10. These results were communicated to Dr. Kerney Elbe at 2:13 am on 11/14/2018 by text page via the [facility]  messaging system. Electronically Signed   By: Ulyses Jarred M.D.   On: 11/14/2018 02:14   Cerebral Angio 1.High grade stenosis of RT MCA distal RT MCA M1 seg.(approx 85 to 90 %)   2.Approx 65 to 70 % stenosis of RT ICA supraclinoid region  PHYSICAL EXAM  Temp:  [99.1 F (37.3 C)] 99.1 F (37.3 C) (09/11 0156) Pulse Rate:  [63-100]  78 (09/11 1700) Resp:  [12-23] 20 (09/11 1700) BP: (123-164)/(69-107) 140/87 (09/11 1700) SpO2:  [96 %-100 %] 100 % (09/11 1600) Weight:  [96.1 kg] 96.1 kg (09/11 0252)  General - Well nourished, well developed, in no apparent distress.  Ophthalmologic - fundi not visualized due to noncooperation.  Cardiovascular - Regular rate and rhythm.  Mental Status -  Level of arousal and orientation to time, place, and person were intact. Language including expression, naming, repetition, comprehension was assessed and found intact. Attention span and concentration were normal. Fund of Knowledge was assessed and was intact.  Cranial Nerves II - XII - II - Visual field intact OU. III, IV, VI - Extraocular movements intact. V - Facial sensation intact bilaterally. VII - Facial movement intact bilaterally. VIII - Hearing & vestibular intact bilaterally. X - Palate elevates symmetrically. XI - Chin turning & shoulder shrug intact bilaterally. XII - Tongue protrusion intact.  Motor Strength - The patient's strength was normal in all extremities and pronator drift was absent.  Bulk was normal and fasciculations were absent.   Motor Tone - Muscle tone was assessed at the neck and appendages and was normal.  Reflexes - The patient's reflexes were symmetrical in all extremities and he had no pathological reflexes.  Sensory - Light touch, temperature/pinprick were assessed and were symmetrical.    Coordination - The patient had normal movements in the hands and feet with no ataxia or dysmetria.  Tremor was absent.  Gait and Station -  deferred.   ASSESSMENT/PLAN Mr. Brad Holmes is a 48 y.o. male with history of migraines, HTN, HLD, vitreal hemorrhage s/p OR presenting with HA, left arm > leg weakness, dysarthria and left facial droop.   Stroke: R CR small infarct due to severe R M1 stenosis as well as right ICA siphon stenosis  Code Stroke CT head No acute abnormality. ASPECTS 10.     CTA head severe stenosis distal R M1 w/ patent branches. Mild to mod narrowing R ICA terminus  CTA neck normal  CT perfusion no core infarct or penumbra  MRI right CR small acute infarct  Cerebral angio  R M1 85-90% stenosis, right supraclinoid 65-70% stenosis  2D Echo pending   LDL pending, 260 on 08/11/18  HgbA1c pending   Lovenox 40 mg sq daily for VTE prophylaxis  aspirin 81 mg daily prior to admission, now on aspirin 325 mg daily and clopidogrel 75 mg daily following load of both.  Continue ASA 325 and plavix 75 for 3 months and then plavix alone due to intracranial stenosis  Therapy recommendations:  pending  Disposition:  pending   Right M1 stenosis  CTA and IR confirmed  Discussed with Dr. Estanislado Pandy who recommends pt to repeat angiogram in 2-3 weeks to re-evaluate right M1 stenosis  May consider right M1 stenting when appropriate  Hypertension  Stable . Permissive hypertension (OK if < 220/120) but gradually normalize in 5-7 days . Long-term BP goal 130-150 given tandem stenosis of right ICA terminus and right M1   Hyperlipidemia Familial hypercholesterolemia   Home meds:  crestor 40, resumed in hospital  LDL 260, goal < 70  Agree with PCSK9 inhibitors if needed as outpt  Continue statin at discharge  Other Stroke Risk Factors  Obesity, Body mass index is 29.55 kg/m., recommend weight loss, diet and exercise as appropriate   Hx migraines  Father died of MI age 13  Other [facility] day # 0  Neurology will  sign off. Please call with questions. Pt will follow up with  stroke clinic NP at [facility] in about 4 weeks. Thanks for the consult.  Rosalin Hawking, MD PhD Stroke Neurology 11/14/2018 6:14 PM    To contact Stroke Continuity provider, please refer to [email or web address] After hours, contact General Neurology

## 2018-11-14 NOTE — ED Notes (Signed)
Main Lab called RN concerning unreadable labels. Labs redrawn and send at this time

## 2018-11-14 NOTE — Procedures (Signed)
S/P 4 vessel; cerebral arteriogram  RT CFA approach. Findings. 1.High grade stenosis of RT MCA distal RT MCA M1 seg.(approx 85 to 90 %)   2.Approx 65 to 70 % stenosis of RT ICA supraclinoid region S.Travares Nelles MD

## 2018-11-14 NOTE — ED Notes (Deleted)
Lunch tray ordered 

## 2018-11-14 NOTE — ED Provider Notes (Signed)
[facility] EMERGENCY DEPARTMENT Provider Note   CSN: [id number] Arrival date & time: 11/14/18  0150     History   Chief Complaint Chief Complaint  Patient presents with   Code Stroke   Level 5 caveat due to acuity of condition HPI Brad Holmes is a 48 y.o. male.     The history is provided by the patient and the spouse. The history is limited by the condition of the patient.  Weakness Severity:  Severe Onset quality:  Sudden Timing:  Constant Chronicity:  New Relieved by:  None tried Worsened by:  Nothing Patient presents with wife for slurred speech, left facial droop, left arm drift.  Onset approximately 1:30 AM.  Patient hs never had this before. No other details known on arrival  Past Medical History:  Diagnosis Date   Hypercholesterolemia    Hypertension     Patient Active Problem List   Diagnosis Date Noted   Abnormal transaminases 08/12/2018   Family history of early CAD 08/11/2018   Hypertension 08/11/2018   Hypercholesteremia 08/11/2018   Screening for HIV without presence of risk factors 08/11/2018   Colon cancer screening 08/11/2018   Chronic arthralgias of knees and hips 10/09/2016    Past Surgical History:  Procedure Laterality Date   EYE SURGERY          Home Medications    Prior to Admission medications   Medication Sig Start Date End Date Taking? Authorizing Provider  aspirin EC 81 MG tablet Take 81 mg by mouth daily.    [provider]  losartan (COZAAR) 25 MG tablet Take 1 tablet (25 mg total) by mouth at bedtime. 08/11/18   Zenia Resides, MD  rosuvastatin (CRESTOR) 40 MG tablet Take 1 tablet (40 mg total) by mouth daily. 08/12/18   Zenia Resides, MD    Family History Family History  Problem Relation Age of Onset   Colon cancer Neg Hx    Colon polyps Neg Hx    Esophageal cancer Neg Hx    Rectal cancer Neg Hx    Stomach cancer Neg Hx     Social History Social History    Tobacco Use   Smoking status: Never Smoker   Smokeless tobacco: Never Used  Substance Use Topics   Alcohol use: No   Drug use: No     Allergies   No known allergies   Review of Systems Review of Systems  Unable to perform ROS: Acuity of condition  Neurological: Positive for weakness.     Physical Exam Updated Vital Signs BP (!) 146/86 (BP Location: Right Arm)    Pulse 93    Temp 99.1 F (37.3 C) (Oral)    SpO2 100%   Physical Exam CONSTITUTIONAL: Well developed/well nourished, anxious HEAD: Normocephalic/atraumatic EYES: EOMI/PERRL ENMT: Mucous membranes moist NECK: supple no meningeal signs Spine - no CTL tenderness CV: S1/S2 noted, no murmurs/rubs/gallops noted LUNGS: Lungs are clear to auscultation bilaterally, no apparent distress ABDOMEN: soft, nontender GU - no CVA tenderness NEURO: Pt is awake/alert/appropriate, answers questions appropriately.? Left facial droop, no arm or leg drift EXTREMITIES: pulses normal/equal, full ROM SKIN: warm, color normal PSYCH: anxious   ED Treatments / Results  Labs (all labs ordered are listed, but only abnormal results are displayed) Labs Reviewed  COMPREHENSIVE METABOLIC PANEL - Abnormal; Notable for the following components:      Result Value   Glucose, Bld 107 (*)    Calcium 8.8 (*)    Total  Protein 5.8 (*)    Albumin 3.4 (*)    All other components within normal limits  CBG MONITORING, ED - Abnormal; Notable for the following components:   Glucose-Capillary 106 (*)    All other components within normal limits  SARS CORONAVIRUS 2 (HOSPITAL ORDER, [facility])  ETHANOL  PROTIME-INR  APTT  CBC  DIFFERENTIAL  RAPID URINE DRUG SCREEN, HOSP PERFORMED  URINALYSIS, ROUTINE W REFLEX MICROSCOPIC  I-STAT CHEM 8, ED    EKG EKG Interpretation  Date/Time:  Friday November 14 2018 02:54:13 EDT Ventricular Rate:  82 PR Interval:    QRS Duration: 92 QT Interval:  363 QTC  Calculation: 424 R Axis:   34 Text Interpretation:  Sinus rhythm Confirmed by Ripley Fraise [phone]) on 11/14/2018 3:00:24 AM   Radiology Ct Code Stroke Cta Head W/wo Contrast  Result Date: 11/14/2018 CLINICAL DATA:  Slurred speech and left-sided weakness EXAM: CT ANGIOGRAPHY HEAD AND NECK CT PERFUSION BRAIN TECHNIQUE: Multidetector CT imaging of the head and neck was performed using the standard protocol during bolus administration of intravenous contrast. Multiplanar CT image reconstructions and MIPs were obtained to evaluate the vascular anatomy. Carotid stenosis measurements (when applicable) are obtained utilizing NASCET criteria, using the distal internal carotid diameter as the denominator. Multiphase CT imaging of the brain was performed following IV bolus contrast injection. Subsequent parametric perfusion maps were calculated using RAPID software. CONTRAST:  13mL OMNIPAQUE IOHEXOL 350 MG/ML SOLN COMPARISON:  Head CT 11/13/2018 FINDINGS: CTA NECK FINDINGS SKELETON: There is no bony spinal canal stenosis. No lytic or blastic lesion. OTHER NECK: Normal pharynx, larynx and major salivary glands. No cervical lymphadenopathy. Unremarkable thyroid gland. UPPER CHEST: No pneumothorax or pleural effusion. No nodules or masses. AORTIC ARCH: There is no calcific atherosclerosis of the aortic arch. There is no aneurysm, dissection or hemodynamically significant stenosis of the visualized ascending aorta and aortic arch. Conventional 3 vessel aortic branching pattern. The visualized proximal subclavian arteries are widely patent. RIGHT CAROTID SYSTEM: --Common carotid artery: Widely patent origin without common carotid artery dissection or aneurysm. --Internal carotid artery: Normal without aneurysm, dissection or stenosis. --External carotid artery: No acute abnormality. LEFT CAROTID SYSTEM: --Common carotid artery: Widely patent origin without common carotid artery dissection or aneurysm. --Internal carotid  artery: Normal without aneurysm, dissection or stenosis. --External carotid artery: No acute abnormality. VERTEBRAL ARTERIES: Codominant configuration. Both origins are clearly patent. No dissection, occlusion or flow-limiting stenosis to the skull base (V1-V3 segments). CTA HEAD FINDINGS POSTERIOR CIRCULATION: --Vertebral arteries: Normal V4 segments. --Posterior inferior cerebellar arteries (PICA): Patent origins from the vertebral arteries. --Anterior inferior cerebellar arteries (AICA): Patent origins from the basilar artery. 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 --Superior cerebellar arteries: Normal. --Posterior cerebral arteries: Normal. Both originate from the basilar artery. Posterior communicating arteries (p-comm) are diminutive or absent. ANTERIOR CIRCULATION: --Intracranial internal carotid arteries: There is mild-to-moderate narrowing of the right carotid terminus. Normal left ICA. --Anterior cerebral arteries (ACA): Normal. Both A1 segments are present. Patent anterior communicating artery (a-comm). --Middle cerebral arteries (MCA): There is severe stenosis of the distal M1 segment of the right MCA. The distal MCA branches are widely patent. The left MCA is normal. VENOUS SINUSES: As permitted by contrast timing, patent. ANATOMIC VARIANTS: None Review of the MIP images confirms the above findings. CT Brain Perfusion Findings: ASPECTS: 10 CBF (<30%) Volume: 13mL Perfusion (Tmax>6.0s) volume: 44mL Mismatch Volume: 53mL Infarction Location:None IMPRESSION: 1. No core infarct or penumbra. 2. Severe stenosis of the distal  M1 segment of the right MCA with widely patent distal branches. 3. Mild-to-moderate narrowing of the right carotid terminus. 4. Normal CTA of the neck. Discussed with Kerney Elbe at 2:55 AM on 11/14/2018. Electronically Signed   By: Ulyses Jarred M.D.   On: 11/14/2018 03:06   Ct Code Stroke Cta Neck W/wo Contrast  Result Date: 11/14/2018 CLINICAL DATA:   Slurred speech and left-sided weakness EXAM: CT ANGIOGRAPHY HEAD AND NECK CT PERFUSION BRAIN TECHNIQUE: Multidetector CT imaging of the head and neck was performed using the standard protocol during bolus administration of intravenous contrast. Multiplanar CT image reconstructions and MIPs were obtained to evaluate the vascular anatomy. Carotid stenosis measurements (when applicable) are obtained utilizing NASCET criteria, using the distal internal carotid diameter as the denominator. Multiphase CT imaging of the brain was performed following IV bolus contrast injection. Subsequent parametric perfusion maps were calculated using RAPID software. CONTRAST:  154mL OMNIPAQUE IOHEXOL 350 MG/ML SOLN COMPARISON:  Head CT 11/13/2018 FINDINGS: CTA NECK FINDINGS SKELETON: There is no bony spinal canal stenosis. No lytic or blastic lesion. OTHER NECK: Normal pharynx, larynx and major salivary glands. No cervical lymphadenopathy. Unremarkable thyroid gland. UPPER CHEST: No pneumothorax or pleural effusion. No nodules or masses. AORTIC ARCH: There is no calcific atherosclerosis of the aortic arch. There is no aneurysm, dissection or hemodynamically significant stenosis of the visualized ascending aorta and aortic arch. Conventional 3 vessel aortic branching pattern. The visualized proximal subclavian arteries are widely patent. RIGHT CAROTID SYSTEM: --Common carotid artery: Widely patent origin without common carotid artery dissection or aneurysm. --Internal carotid artery: Normal without aneurysm, dissection or stenosis. --External carotid artery: No acute abnormality. LEFT CAROTID SYSTEM: --Common carotid artery: Widely patent origin without common carotid artery dissection or aneurysm. --Internal carotid artery: Normal without aneurysm, dissection or stenosis. --External carotid artery: No acute abnormality. VERTEBRAL ARTERIES: Codominant configuration. Both origins are clearly patent. No dissection, occlusion or  flow-limiting stenosis to the skull base (V1-V3 segments). CTA HEAD FINDINGS POSTERIOR CIRCULATION: --Vertebral arteries: Normal V4 segments. --Posterior inferior cerebellar arteries (PICA): Patent origins from the vertebral arteries. --Anterior inferior cerebellar arteries (AICA): Patent origins from the basilar artery. 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 --Superior cerebellar arteries: Normal. --Posterior cerebral arteries: Normal. Both originate from the basilar artery. Posterior communicating arteries (p-comm) are diminutive or absent. ANTERIOR CIRCULATION: --Intracranial internal carotid arteries: There is mild-to-moderate narrowing of the right carotid terminus. Normal left ICA. --Anterior cerebral arteries (ACA): Normal. Both A1 segments are present. Patent anterior communicating artery (a-comm). --Middle cerebral arteries (MCA): There is severe stenosis of the distal M1 segment of the right MCA. The distal MCA branches are widely patent. The left MCA is normal. VENOUS SINUSES: As permitted by contrast timing, patent. ANATOMIC VARIANTS: None Review of the MIP images confirms the above findings. CT Brain Perfusion Findings: ASPECTS: 10 CBF (<30%) Volume: 45mL Perfusion (Tmax>6.0s) volume: 83mL Mismatch Volume: 33mL Infarction Location:None IMPRESSION: 1. No core infarct or penumbra. 2. Severe stenosis of the distal M1 segment of the right MCA with widely patent distal branches. 3. Mild-to-moderate narrowing of the right carotid terminus. 4. Normal CTA of the neck. Discussed with Kerney Elbe at 2:55 AM on 11/14/2018. Electronically Signed   By: Ulyses Jarred M.D.   On: 11/14/2018 03:06   Ct Code Stroke Cta Cerebral Perfusion W/wo Contrast  Result Date: 11/14/2018 CLINICAL DATA:  Slurred speech and left-sided weakness EXAM: CT ANGIOGRAPHY HEAD AND NECK CT PERFUSION BRAIN TECHNIQUE: Multidetector CT imaging of the head and neck was  performed using the standard protocol during bolus  administration of intravenous contrast. Multiplanar CT image reconstructions and MIPs were obtained to evaluate the vascular anatomy. Carotid stenosis measurements (when applicable) are obtained utilizing NASCET criteria, using the distal internal carotid diameter as the denominator. Multiphase CT imaging of the brain was performed following IV bolus contrast injection. Subsequent parametric perfusion maps were calculated using RAPID software. CONTRAST:  145mL OMNIPAQUE IOHEXOL 350 MG/ML SOLN COMPARISON:  Head CT 11/13/2018 FINDINGS: CTA NECK FINDINGS SKELETON: There is no bony spinal canal stenosis. No lytic or blastic lesion. OTHER NECK: Normal pharynx, larynx and major salivary glands. No cervical lymphadenopathy. Unremarkable thyroid gland. UPPER CHEST: No pneumothorax or pleural effusion. No nodules or masses. AORTIC ARCH: There is no calcific atherosclerosis of the aortic arch. There is no aneurysm, dissection or hemodynamically significant stenosis of the visualized ascending aorta and aortic arch. Conventional 3 vessel aortic branching pattern. The visualized proximal subclavian arteries are widely patent. RIGHT CAROTID SYSTEM: --Common carotid artery: Widely patent origin without common carotid artery dissection or aneurysm. --Internal carotid artery: Normal without aneurysm, dissection or stenosis. --External carotid artery: No acute abnormality. LEFT CAROTID SYSTEM: --Common carotid artery: Widely patent origin without common carotid artery dissection or aneurysm. --Internal carotid artery: Normal without aneurysm, dissection or stenosis. --External carotid artery: No acute abnormality. VERTEBRAL ARTERIES: Codominant configuration. Both origins are clearly patent. No dissection, occlusion or flow-limiting stenosis to the skull base (V1-V3 segments). CTA HEAD FINDINGS POSTERIOR CIRCULATION: --Vertebral arteries: Normal V4 segments. --Posterior inferior cerebellar arteries (PICA): Patent origins from the  vertebral arteries. --Anterior inferior cerebellar arteries (AICA): Patent origins from the basilar artery. 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 --Superior cerebellar arteries: Normal. --Posterior cerebral arteries: Normal. Both originate from the basilar artery. Posterior communicating arteries (p-comm) are diminutive or absent. ANTERIOR CIRCULATION: --Intracranial internal carotid arteries: There is mild-to-moderate narrowing of the right carotid terminus. Normal left ICA. --Anterior cerebral arteries (ACA): Normal. Both A1 segments are present. Patent anterior communicating artery (a-comm). --Middle cerebral arteries (MCA): There is severe stenosis of the distal M1 segment of the right MCA. The distal MCA branches are widely patent. The left MCA is normal. VENOUS SINUSES: As permitted by contrast timing, patent. ANATOMIC VARIANTS: None Review of the MIP images confirms the above findings. CT Brain Perfusion Findings: ASPECTS: 10 CBF (<30%) Volume: 38mL Perfusion (Tmax>6.0s) volume: 65mL Mismatch Volume: 32mL Infarction Location:None IMPRESSION: 1. No core infarct or penumbra. 2. Severe stenosis of the distal M1 segment of the right MCA with widely patent distal branches. 3. Mild-to-moderate narrowing of the right carotid terminus. 4. Normal CTA of the neck. Discussed with Kerney Elbe at 2:55 AM on 11/14/2018. Electronically Signed   By: Ulyses Jarred M.D.   On: 11/14/2018 03:06   Dg Chest Port 1 View  Result Date: 11/14/2018 CLINICAL DATA:  Weakness EXAM: PORTABLE CHEST 1 VIEW COMPARISON:  June 01, 2015 FINDINGS: The heart size and mediastinal contours are within normal limits. Both lungs are clear. The visualized skeletal structures are unremarkable. IMPRESSION: No acute cardiopulmonary process. Electronically Signed   By: Prudencio Pair M.D.   On: 11/14/2018 03:01   Ct Head Code Stroke Wo Contrast  Result Date: 11/14/2018 CLINICAL DATA:  Code stroke.  Slurred speech and  left-sided weakness EXAM: CT HEAD WITHOUT CONTRAST TECHNIQUE: Contiguous axial images were obtained from the base of the skull through the vertex without intravenous contrast. COMPARISON:  None. FINDINGS: Brain: There is no mass, hemorrhage or extra-axial collection. The size and configuration  of the ventricles and extra-axial CSF spaces are normal. The brain parenchyma is normal, without evidence of acute or chronic infarction. Vascular: No abnormal hyperdensity of the major intracranial arteries or dural venous sinuses. No intracranial atherosclerosis. Skull: The visualized skull base, calvarium and extracranial soft tissues are normal. Sinuses/Orbits: No fluid levels or advanced mucosal thickening of the visualized paranasal sinuses. No mastoid or middle ear effusion. The orbits are normal. ASPECTS [facility] Stroke Program Early CT Score) - Ganglionic level infarction (caudate, lentiform nuclei, internal capsule, insula, M1-M3 cortex): 7 - Supraganglionic infarction (M4-M6 cortex): 3 Total score (0-10 with 10 being normal): 10 IMPRESSION: 1. No intracranial hemorrhage. 2. ASPECTS is 10. These results were communicated to Dr. Kerney Elbe at 2:13 am on 11/14/2018 by text page via the [facility] messaging system. Electronically Signed   By: Ulyses Jarred M.D.   On: 11/14/2018 02:14    Procedures Procedures    Medications Ordered in ED Medications - No data to display   Initial Impression / Assessment and Plan / ED Course  I have reviewed the triage vital signs and the nursing notes.  Pertinent labs & imaging results that were available during my care of the patient were reviewed by me and considered in my medical decision making (see chart for details).        2:42 AM Patient seen soon as he was brought to the emergency department area for possible stroke.  I immediately activated a code stroke.  Patient reported left-sided deficits with slurred speech.  Patient is now off at CT imaging and is being  evaluated by neurologist Dr. Cheral Marker 4:13 AM Patient is improved. He is awake alert no acute distress.  No focal deficits on my exam. Patient found to have severe stenosis of distal M1 segment of the right MCA He will be admitted to the medical service and have an angiogram in the morning. Patient agreed with plan 4:31 AM Discussed with family medicine resident for admission Final Clinical Impressions(s) / ED Diagnoses   Final diagnoses:  TIA (transient ischemic attack)    ED Discharge Orders    None       Ripley Fraise, MD 11/14/18 [phone]

## 2018-11-15 DIAGNOSIS — I639 Cerebral infarction, unspecified: Secondary | ICD-10-CM

## 2018-11-15 LAB — CBC
HCT: 43.3 % (ref 39.0–52.0)
Hemoglobin: 14.6 g/dL (ref 13.0–17.0)
MCH: 29.9 pg (ref 26.0–34.0)
MCHC: 33.7 g/dL (ref 30.0–36.0)
MCV: 88.7 fL (ref 80.0–100.0)
Platelets: 256 10*3/uL (ref 150–400)
RBC: 4.88 MIL/uL (ref 4.22–5.81)
RDW: 12.9 % (ref 11.5–15.5)
WBC: 7.6 10*3/uL (ref 4.0–10.5)
nRBC: 0 % (ref 0.0–0.2)

## 2018-11-15 LAB — BASIC METABOLIC PANEL
Anion gap: 7 (ref 5–15)
BUN: 7 mg/dL (ref 6–20)
CO2: 24 mmol/L (ref 22–32)
Calcium: 9.1 mg/dL (ref 8.9–10.3)
Chloride: 108 mmol/L (ref 98–111)
Creatinine, Ser: 0.89 mg/dL (ref 0.61–1.24)
GFR calc Af Amer: >60 mL/min (ref >60)
GFR calc non Af Amer: >60 mL/min (ref >60)
Glucose, Bld: 86 mg/dL (ref 70–99)
Potassium: 4.1 mmol/L (ref 3.5–5.1)
Sodium: 139 mmol/L (ref 135–145)

## 2018-11-15 MED ORDER — ASPIRIN 325 MG PO TBEC: 325.0000 mg | DELAYED_RELEASE_TABLET | Freq: Every day | ORAL | 0 refills | Status: DC

## 2018-11-15 MED ORDER — CLOPIDOGREL BISULFATE 75 MG PO TABS: 75.0000 mg | ORAL_TABLET | Freq: Every day | ORAL | 0 refills | Status: DC

## 2018-11-15 MED ORDER — CLOPIDOGREL BISULFATE 75 MG PO TABS
300.0000 mg | ORAL_TABLET | Freq: Every day | ORAL | Status: AC
  Administered 2018-11-15: 300 mg via ORAL
  Filled 2018-11-15: qty 4

## 2018-11-15 MED ORDER — CLOPIDOGREL BISULFATE 75 MG PO TABS: 75.0000 mg | ORAL_TABLET | Freq: Every day | ORAL | Status: DC

## 2018-11-15 NOTE — Evaluation (Signed)
Physical Therapy Evaluation Patient Details Name: Brad Holmes MRN: [id number] DOB: 12-26-1970 Today's Date: 11/15/2018   History of Present Illness  Brad Holmes is a 48 y.o. male who presented with acute onset left-sided weakness difficulty speaking. Patient reported he was lying in his bed when he felt short of breath and tried to get out of bed but was unable to.  Wife noted that he had a left-sided facial droop as well as left upper extremity weakness. She immediately drove him to the ED. Presentation to the ED he was noted to have dysarthria as well as left-sided facial droop and left arm and leg weakness. Neurology was consulted and after CT head patient had an significant improvement in his symptoms and his symptoms completely normalized after following CTA so tPA was not given.  CT head was negative for bleed. CTA head showed severe stenosis of the distal M1 segment of the right MCA    Clinical Impression  Pt moving well. He has less stability with left leg stance versus right leg - gave him exercise to address this.  Pt educated on starting safe walking program and how to advance.  Pt to monitor his vitals as needed.  Pt will not need any PT follow up.  He is ready for DC from PT stand point.    Follow Up Recommendations No PT follow up    Equipment Recommendations  None recommended by PT    Recommendations for Other Services       Precautions / Restrictions Precautions Precautions: None Precaution Comments: pt with no history of falls Restrictions Weight Bearing Restrictions: No      Mobility  Bed Mobility Overal bed mobility: Independent                Transfers Overall transfer level: Independent Equipment used: None             General transfer comment: Pt safe with basic mobility  Ambulation/Gait Ambulation/Gait assistance: Independent Gait Distance (Feet): 150 Feet Assistive device: None Gait Pattern/deviations: WFL(Within Functional Limits)      General Gait Details: Pt did marching, head turns, walking on toes and walking on heels without any deficits or loss of balance  Stairs            Wheelchair Mobility    Modified Rankin (Stroke Patients Only)       Balance Overall balance assessment: Independent                                           Pertinent Vitals/Pain Pain Assessment: No/denies pain    Home Living Family/patient expects to be discharged to:: Private residence Living Arrangements: Spouse/significant other Available Help at Discharge: Family;Friend(s) Type of Home: House         Home Equipment: None      Prior Function Level of Independence: Independent         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 pt able to balance 10 seconds wobbly on left leg - balanced 30 seconds on right leg.  Pt given exercises to work on left leg stance.  pt has history of ankle problems both legs - he is unsure how unilateral stance was prior to TIA    Exercises Other Exercises Other Exercises: Pt standing near sturdy object - hip abduction x 10 reps each leg. pt cued to stand up tall.  do slowly   Assessment/Plan    PT Assessment Patent does not need any further PT services  PT Problem List         PT Treatment Interventions      PT Goals (Current goals can be found  in the Care Plan section)  Acute Rehab PT Goals Patient Stated Goal: to go home and never have problem with this again PT Goal Formulation: With patient/family Time For Goal Achievement: 11/15/18 Potential to Achieve Goals: Good    Frequency     Barriers to discharge        Co-evaluation               AM-PAC PT "6 Clicks" Mobility  Outcome Measure Help needed turning from your back to your side while in a flat bed without using bedrails?: None Help needed moving from lying on your back to sitting on the side of a flat bed without using bedrails?: None Help needed moving to and from a bed to a chair (including a wheelchair)?: None Help needed standing up from a chair using your arms (e.g., wheelchair or bedside chair)?: None Help needed to walk in hospital room?: None Help needed climbing 3-5 steps with a railing? : None 6 Click Score: 24    End of Session Equipment Utilized During Treatment: Gait belt Activity Tolerance: Patient tolerated treatment well Patient left: in chair;with chair alarm set;with family/visitor present Nurse Communication: Mobility status PT Visit Diagnosis: Unsteadiness on feet (R26.81)    Time: 0840-0900 PT Time Calculation (min) (ACUTE ONLY): 20 min   Charges:   PT Evaluation $PT Eval Low Complexity: 1 Low PT Treatments $Gait Training: 8-22 mins        11/15/2018   Rande Lawman, PT   Loyal Buba 11/15/2018, 9:14 AM

## 2018-11-15 NOTE — TOC Transition Note (Signed)
Transition of Care [facility]) - CM/SW Discharge Note   Patient Details  Name: ABDULMALIK BIESINGER MRN: [id number] Date of Birth: 1970/09/07  [facility] CM/SW Contact:  Pollie Friar, RN Phone Number: 11/15/2018, 12:58 PM   Clinical Narrative:    Pt is discharging home with self care. Has PCP, insurance and transportation home.  Final next level of care: Home/Self Care Barriers to Discharge: No Barriers Identified   Patient Goals and CMS Choice        Discharge Placement                       Discharge Plan and Services                                     Social Determinants of Health (SDOH) Interventions     Readmission Risk Interventions No flowsheet data found.

## 2018-11-15 NOTE — Progress Notes (Signed)
SLP Cancellation Note  Patient Details Name: Brad Holmes MRN: [id number] DOB: 1970-08-30   Cancelled treatment:       Reason Eval/Treat Not Completed: SLP screened, no needs identified, will sign off   Elvina Sidle, M.S., CCC-SLP 11/15/2018, 1:00 PM

## 2018-11-15 NOTE — Discharge Instructions (Signed)
Thank you for allowing [place] to participate in your care!    You were admitted for a stroke. You were found to have some stenosis in your brain. Neurology followed you and have made the following recommendations:  Continue ASA 325 and plavix 75mg  daily for 3 months, and then plavix 75mg  daily alone.  Please STOP your losartan.   Please follow up with Interventional Radiology for a repeat angiogram in 2-3 weeks to re-evaluate right M1 stenosis.  Patient to continue rosuvastatin 40 mg and try to continue to exercise and focus on good dietary habits.   Please follow up with stroke clinic in about 4 weeks.  You have a follow up appointment with Dr. Andria Frames on 11/20/2018 at 9:50am, please arrive 15 minutes early.   PLEASE BE SURE TO SCHEDULE FOLLOW UP APPOINTMENT WITH DR. Estanislado Pandy FOR REPEAT ANGIOGRAM IN 3 WEEKS  If you experience worsening of your admission symptoms, develop shortness of breath, life threatening emergency, suicidal or homicidal thoughts you must seek medical attention immediately by calling 911 or calling your MD immediately  if symptoms less severe.

## 2018-11-15 NOTE — Progress Notes (Signed)
Discharged to home after IV removed and all questions answered

## 2018-11-16 NOTE — Progress Notes (Signed)
Occupational Therapy Evaluation (late entry)  Pt presents to OT with the below listed diagnosis.  He demonstrates very mild higher level cognitive deficits.  He was provided with list of activities to challenge cognition, and was instructed to have family/friends double check him initially, and to contact his MD if he has not returned to baseline in 2 weeks.  He lives with wife and was fully independent PTA.  He currently is able to perform ADLs independently.   No follow up needs identified.    11/15/18 1800  OT Visit Information  Last OT Received On 11/15/18  Assistance Needed +1  History of Present Illness Brad Holmes is a 48 y.o. male who presented with acute onset left-sided weakness difficulty speaking. Patient reported he was lying in his bed when he felt short of breath and tried to get out of bed but was unable to.  Wife noted that he had a left-sided facial droop as well as left upper extremity weakness. She immediately drove him to the ED. Presentation to the ED he was noted to have dysarthria as well as left-sided facial droop and left arm and leg weakness. Neurology was consulted and after CT head patient had an significant improvement in his symptoms and his symptoms completely normalized after following CTA so tPA was not given.  CT head was negative for bleed. CTA head showed severe stenosis of the distal M1 segment of the right MCA  Precautions  Precautions None  Precaution Comments pt with no history of falls  Home Living  Family/patient expects to be discharged to: Private residence  Living Arrangements Spouse/significant other  Available Help at Discharge Family;Friend(s)  Type of [place] None  Prior Function  Level of Independence Independent  Comments Pt works as a Pensions consultant No difficulties  Pain Assessment  Pain Assessment No/denies pain  Cognition  Arousal/Alertness  Awake/alert  Behavior During Therapy WFL for tasks assessed/performed  Overall Cognitive Status Impaired/Different from baseline  General Comments Pt demonstrates delayed processing during multi tasking activities - had a difficult time with serial 7 subtraction while ambulating.  He acknowledges this is not his baseline.  he was able to follow multi step commands independently, use signage for path finding independently   Upper Extremity Assessment  Upper Extremity Assessment Defer to OT evaluation  Lower Extremity Assessment  Lower Extremity Assessment Overall WFL for tasks assessed  Cervical / Trunk Assessment  Cervical / Trunk Assessment Normal  ADL  Overall ADL's  Independent  Vision- History  Baseline Vision/History No visual deficits  Patient Visual Report No change from baseline  Vision- Assessment  Vision Assessment? No apparent visual deficits  Perception  Perception Tested? Yes  Praxis  Praxis tested? WFL  Bed Mobility  Overal bed mobility Independent  Transfers  Overall transfer level Independent  Balance  Overall balance assessment Independent  General Comments  General comments (skin integrity, edema, etc.) reviewed BEFAST.  Also discussed post stroke fatigue and need to pace self and take brain breaks.  He was provided with list of apps to challenge cognition and activities to work on at home.  Instructed him to have family/friends double check him with cognitive and work tasks initially, and to speak with MD about referral to [place] if he is not back to baseline in 2 weeks - he verbalized understanding   OT - End of Session  Activity Tolerance Patient tolerated treatment well  Patient  left in bed;with call bell/phone within reach  Nurse Communication Mobility status  OT Assessment  OT Recommendation/Assessment Patient does not need any further OT services  OT Visit Diagnosis Cognitive communication deficit (R41.841)  Symptoms and signs involving cognitive functions  Cerebral infarction  OT Problem List Decreased cognition  AM-PAC OT "6 Clicks" Daily Activity Outcome Measure (Version 2)  Help from another person eating meals? 4  Help from another person taking care of personal grooming? 4  Help from another person toileting, which includes using toliet, bedpan, or urinal? 4  Help from another person bathing (including washing, rinsing, drying)? 4  Help from another person to put on and taking off regular upper body clothing? 4  Help from another person to put on and taking off regular lower body clothing? 4  6 Click Score 24  OT Recommendation  Follow Up Recommendations No OT follow up  OT Equipment None recommended by OT  Acute Rehab OT Goals  Patient Stated Goal to get back to normal   OT Goal Formulation All assessment and education complete, DC therapy  OT Time Calculation  OT Start Time (ACUTE ONLY) 1042  OT Stop Time (ACUTE ONLY) 1106  OT Time Calculation (min) 24 min  OT General Charges  $OT Visit 1 Visit  OT Evaluation  $OT Eval Low Complexity 1 Low  OT Treatments  $Therapeutic Activity 8-22 mins  Written Expression  Dominant Hand Right  Lucille Passy, OTR/L [place] Pager [phone] Office [phone]

## 2018-11-17 ENCOUNTER — Encounter (HOSPITAL_COMMUNITY): Payer: Self-pay | Admitting: Interventional Radiology

## 2018-11-17 ENCOUNTER — Other Ambulatory Visit: Payer: Self-pay | Admitting: *Deleted

## 2018-11-17 MED FILL — ASPIRIN EC 325 MG TABLET: 325 | 30 days supply | Qty: 30 | Fill #0

## 2018-11-17 NOTE — Patient Outreach (Addendum)
[place] [facility]) Care Management  11/17/2018  DOMINGOS TREVIZO [birth year] [id number]   Transition of care call/case closure   Referral received: 11/17/18 Initial outreach: 11/17/18 Insurance: [place] [employer] Plan   Subjective: Initial successful telephone call to patient's preferred (mobile) number in order to complete transition of care assessment; 2 HIPAA identifiers verified. Explained purpose of call and completed transition of care assessment.  Marya Amsler states he is doing well, has no residual effects from transient ischemic attack.   He says he purchased a blood pressure monitor and is self monitoring at home.  His spouse, a Psychologist, educational,  is assisting with his recovery.  He is receptive to a referral to [facility]'s Active Health Management chronic disease management program for self management assistance with HTN and hyperlipidemia.   He says his wife will check to see if he has the hospital indemnity insurance. He says he uses the [facility] outpatient pharmacy on [address]  He denies educational needs related to staying safe during the COVID 19 pandemic.    Objective:  Kailen Ohare was hospitalized at [facility] from 9/11-9/02/2019 for symptoms of a transient ischemic attack.  Comorbidities include: HTN, hypercholesteremia  He was discharged to home on 11/15/18 without the need for home health services or DME.   Assessment:  Patient voices good understanding of all discharge instructions except for heart healthy diet and the difference between a stroke and a transient ischemic attack.  See transition of care flowsheet for assessment details.   Plan:  Reviewed hospital discharge diagnosis of transient ischemic attack and treatment plan using hospital discharge instructions, assessing medication adherence, reviewing problems requiring provider notification, and discussing the importance of follow up with his primary care provider and specialists as  directed. Reviewed current systolic blood pressure goal of 130-150 due to cerebral stenosis.  Reviewed [place] chronic disease management program benefit with [employer] Management and placed referral for self management assistance of HTN and hyperlipidemia. No ongoing care management needs identified so will close case to [place] Management services and route successful outreach letter with [place] Management pamphlet and 24 Hour Nurse Line Magnet to [place] Management clinical pool to be mailed to patient's home address. Included the following educational handouts the home mailing: Heart Healthy Diet, How to Check Your Blood Pressure at home, TIA vs Stroke and Intracranial Vessel Stenting.  Barrington Ellison RN,CCM,CDE [place] Management Coordinator Office Phone [phone] Office Fax [phone]

## 2018-11-18 ENCOUNTER — Other Ambulatory Visit (HOSPITAL_COMMUNITY): Payer: Self-pay | Admitting: Interventional Radiology

## 2018-11-18 DIAGNOSIS — I63511 Cerebral infarction due to unspecified occlusion or stenosis of right middle cerebral artery: Secondary | ICD-10-CM

## 2018-11-18 MED FILL — ROSUVASTATIN CALCIUM 40 MG: 40 | 90 days supply | Qty: 90 | Fill #1

## 2018-11-20 ENCOUNTER — Other Ambulatory Visit: Payer: Self-pay | Admitting: Radiology

## 2018-11-20 ENCOUNTER — Other Ambulatory Visit: Payer: Self-pay

## 2018-11-20 ENCOUNTER — Other Ambulatory Visit (HOSPITAL_COMMUNITY)
Admission: RE | Admit: 2018-11-20 | Discharge: 2018-11-20 | Disposition: A | Discharge status: Home / Self Care | Payer: No Typology Code available for payment source | Source: Ambulatory Visit | Attending: Interventional Radiology | Admitting: Interventional Radiology

## 2018-11-20 ENCOUNTER — Ambulatory Visit (INDEPENDENT_AMBULATORY_CARE_PROVIDER_SITE_OTHER): Payer: No Typology Code available for payment source | Admitting: Family Medicine

## 2018-11-20 DIAGNOSIS — Z01812 Encounter for preprocedural laboratory examination: Secondary | ICD-10-CM | POA: Insufficient documentation

## 2018-11-20 DIAGNOSIS — I10 Essential (primary) hypertension: Secondary | ICD-10-CM

## 2018-11-20 DIAGNOSIS — Z20828 Contact with and (suspected) exposure to other viral communicable diseases: Secondary | ICD-10-CM | POA: Insufficient documentation

## 2018-11-20 NOTE — Assessment & Plan Note (Signed)
Patient not seen.

## 2018-11-20 NOTE — Progress Notes (Signed)
Patient arrived, then when called by nurse, was not present.  Nurse checked x 2 and in parking lot.  Called his cell.  No answer.  We can see that he has arrived for COVID testing at [facility].  No visit charge.

## 2018-11-21 ENCOUNTER — Other Ambulatory Visit: Payer: Self-pay

## 2018-11-21 ENCOUNTER — Encounter (HOSPITAL_COMMUNITY): Payer: Self-pay | Admitting: *Deleted

## 2018-11-21 LAB — NOVEL CORONAVIRUS, NAA (HOSP ORDER, SEND-OUT TO REF LAB; TAT 18-24 HRS): SARS-CoV-2, NAA: NOT DETECTED

## 2018-11-21 NOTE — Progress Notes (Signed)
Pt denies SOB and chest pain. Pt stated  that he was seen by Dr. Debara Pickett, Cardiology. Pt stated that he had a stress test 10 years ago here at [facility] Pt made aware to stop taking vitamins, fish oil and herbal medications. Do not take any NSAIDs ie: Ibuprofen, Advil, Naproxen (Aleve), Motrin, BC and Goody Powder.  Pt verbalized understanding of all pre-op instructions. PA, Anesthesiology, made aware of order for consult; see note.

## 2018-11-21 NOTE — Anesthesia Preprocedure Evaluation (Addendum)
Anesthesia Evaluation  Patient identified by MRN, date of birth, ID band Patient awake    Reviewed: Allergy & Precautions, NPO status , Patient's Chart, lab work & pertinent test results  History of Anesthesia Complications Negative for: history of anesthetic complications  Airway Mallampati: II  TM Distance: >3 FB Neck ROM: Full    Dental no notable dental hx.    Pulmonary neg pulmonary ROS,    Pulmonary exam normal        Cardiovascular hypertension, Normal cardiovascular exam  Echo 11/14/18: EF 55-60%, normal valves   Neuro/Psych TIA (11/14/18, on Plavix)negative psych ROS   GI/Hepatic negative GI ROS, Neg liver ROS,   Endo/Other  negative endocrine ROS  Renal/GU negative Renal ROS  negative genitourinary   Musculoskeletal negative musculoskeletal ROS (+)   Abdominal   Peds  Hematology negative hematology ROS (+)   Anesthesia Other Findings Day of surgery medications reviewed with patient.  Reproductive/Obstetrics negative OB ROS                           Anesthesia Physical Anesthesia Plan  ASA: III  Anesthesia Plan: General   Post-op Pain Management:    Induction: Intravenous  PONV Risk Score and Plan: 2 and Treatment may vary due to age or medical condition and Ondansetron  Airway Management Planned: Oral ETT  Additional Equipment: Arterial line  Intra-op Plan:   Post-operative Plan: Extubation in OR  Informed Consent: I have reviewed the patients History and Physical, chart, labs and discussed the procedure including the risks, benefits and alternatives for the proposed anesthesia with the patient or authorized representative who has indicated his/her understanding and acceptance.     Dental advisory given  Plan Discussed with: CRNA  Anesthesia Plan Comments: (PAT note written 11/21/2018 by Myra Gianotti, PA-C    )      Anesthesia Quick Evaluation

## 2018-11-21 NOTE — Progress Notes (Signed)
Anesthesia Chart Review: Kathleene Hazel   Case: [id number] Date/Time: 11/24/18 0730   Procedure: RADIOLOGY WITH ANESTHESIA  STENTING (N/A )   Anesthesia type: General   Pre-op diagnosis: STROKE   Location: [place] [facility] OR   Surgeon: Luanne Bras, MD      DISCUSSION: Patient is a 48 year old male scheduled for the above procedure.  History includes never smoker, hypercholesterolemia, HTN, TIA/CVA (11/14/18). Only minor luminal irregularities on 2011 cardiac cath.   He was admitted 11/14/18-11/15/18 with left sided weakness that improved after CTA while in ED, so tPA not given. Imaging showed possible acute/subacaute infarct of right corona radiata with severe stenosis of the distal M1 segment of the right MCA. Neurology and IR consulted. He underwent cerebral arteriogram that showed 85-90% right MCA distal right MCA M1 stenosis and 65-70% right ICA supraclinoid stenosisf the right MCA. Discharged on ASA and Plavix with plan for consideration of right MCA stenting.  COVID-19 test negative on 11/20/18. IR has ordered day of surgery labs. Anesthesia team to evaluate on the day of his procedure.     PROVIDERS: Zenia Resides, MD is PCP   LABS: IR has ordered day of surgery labs. As of 11/15/18, labs results included: Lab Results  Component Value Date   WBC 7.6 11/15/2018   HGB 14.6 11/15/2018   HCT 43.3 11/15/2018   PLT 256 11/15/2018   GLUCOSE 86 11/15/2018   ALT 35 11/14/2018   AST 21 11/14/2018   NA 139 11/15/2018   K 4.1 11/15/2018   CL 108 11/15/2018   CREATININE 0.89 11/15/2018   BUN 7 11/15/2018   CO2 24 11/15/2018   INR 0.9 11/14/2018   HGBA1C 5.5 11/14/2018     IMAGES: S/P 4 vessel; cerebral arteriogram 11/14/18: RT CFA approach. Findings. 1.High grade stenosis of RT MCA distal RT MCA M1 seg.(approx 85 to 90 %)   2.Approx 65 to 70 % stenosis of RT ICA supraclinoid region.  CTA head/neck 11/14/18: IMPRESSION: 1. No core infarct or penumbra. 2.  Severe stenosis of the distal M1 segment of the right MCA with widely patent distal branches. 3. Mild-to-moderate narrowing of the right carotid terminus. 4. Normal CTA of the neck.  MRI Brain 11/14/18: ADDENDUM: After further review and discussion with Dr. Erlinda Hong, there is a small hyperintensity in the right corona radiata on diffusion weighted imaging. This measures approximately 4 x 6 mm and may represent acute or subacute infarction.  1V CXR 11/14/18: FINDINGS: Monitoring leads overlie the patient. Stable cardiac and mediastinal contours. Minimal right basilar atelectasis. No pleural effusion or pneumothorax. Thoracic spine degenerative changes. IMPRESSION: Right basilar atelectasis.   EKG: 11/14/18: NSR   CV: Echo 11/14/18: IMPRESSIONS  1. The left ventricle has normal systolic function, with an ejection fraction of 55-60%. The cavity size was normal. Left ventricular diastolic parameters were normal. No evidence of left ventricular regional wall motion abnormalities.  2. The right ventricle has normal systolic function. The cavity was normal. There is no increase in right ventricular wall thickness. Right ventricular systolic pressure could not be assessed.  3. The aorta is normal unless otherwise noted.  Cardiac cath 05/12/09 Burt Knack, Legrand Como, MD): Assessment: 1. Widely patent coronary arteries with minimal nonobstructive plaque. (LAD with very minor luminal irregularities.  The OM has some minor plaque in the mid and distal vessel, but no areas of significant stenosis.) 2.  Normal left ventricular function.  LVEF estimated at 65%.   Plan: Recommend continued efforts  at cardiac risk reduction.   Past Medical History:  Diagnosis Date  . Hypercholesterolemia   . Hypertension   . Transient ischemic attack     Past Surgical History:  Procedure Laterality Date  . EYE SURGERY    . IR ANGIO INTRA EXTRACRAN SEL COM CAROTID INNOMINATE BILAT MOD SED  11/14/2018  . IR ANGIO  VERTEBRAL SEL VERTEBRAL BILAT MOD SED  11/14/2018    MEDICATIONS: No current facility-administered medications for this encounter.    Marland Kitchen aspirin 325 MG EC tablet  . clopidogrel (PLAVIX) 75 MG tablet  . rosuvastatin (CRESTOR) 40 MG tablet     Myra Gianotti, PA-C Surgical Short Stay/Anesthesiology [facility] Phone [phone] [facility] Phone [phone] 11/21/2018 2:09 PM

## 2018-11-23 NOTE — Progress Notes (Signed)
Pre-test GC notes   Referral Reason Brad Holmes is diagnosed with probable familial hypercholesterolemia and was referred for genetic consult and testing of familial hypercholesterolemia (FH) by Dr. Debara Pickett.  Genetic Consultation Notes  We walked through the risk factors that can lead to hypercholesterolemia. I explained to Brad Holmes that the characteristic features of a genetic condition include absence of risk factors that can lead to elevated LDL-C levels, early age of presentation, increased disease severity and family history of the condition. The clinical manifestations of FH were also reviewed.   I discussed the molecular pathogenesis of FH. I informed him that [place] is primarily caused by pathogenic variants in three genes, namely APOB, LDLR and PCSK9. These pathogenic variants impact LDLR synthesis, degradation and recycling in cells leading to elevated LDL-C levels. We then walked through autosomal dominant inheritance pattern and viewed pedigree of families with heterozygous FH (HeFH) and homozygous FH (HoFH). Explained to him that digenic or compound heterozygous mutations in APOB, LDLR and PCSK9 genes can cause HoFH.   We reviewed the likely outcomes of FH genetic testing. Based on the diagnostic criteria for FH, yields can range from 50%-90%. A positive yield is observed in  ~63% of patients with a definite clinical diagnosis of FH. I also made clear to him that a negative test does not exclude a genetic basis for FH. Polygenic inheritance where more than an average number of common variants with small effect increases plasma lipid levels as well as limitations in current genetic testing methodology can produce a negative result. Variants of unknown significance (VUS) can be seen in some cases. I told him that typically a VUS is so classified if the variant is not well understood as very few individuals have been reported to harbor this variant or its role in gene function has not been  elucidated. Screening other first-degree family members by genetic testing was also discussed. Additionally, we briefly touched upon the molecular basis of the different treatment modalities that are currently available.  His medical and 4-generation family history was obtained. 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 He states that he was told she had elevated LDL-Cs at age 44 while undergoing a physical [facility]. He tells me that though he had 0.5% -0.6% body fat at that time, his LDL-C levels were at 290. He reports LDL-C levels as high as 312 at subsequent lipid screening. Lifestyle and diet modification did not lower his levels. In 2008, he reports having a full body angiography that indicated no blockages in his blood vessels. He has undergone several stress tests with no issues in the past. He informs me that in 2011, he had Norovirus infection that affected his heart and was admitted in the ICU with elevated CPK levels. He had an irregular EKG and was told of having an athletes heart. An echocardiogram revealed fluid accumulation around the heart and, heart cath revealed no blockages at that time.   He tells me that he used to take Lipitor but suffered from memory loss and myalgias. He later switched to Crestor and continued othave memory loss. He reports being without medication of elevated LDLs for 10 years and recently resumed Crestor.   Risk Factors Brad Holmes reports being diagnosed with fatty liver disease about 6 years ago. He denies other conditions of hypothyroidism, hypertension, diabetes and renal dysfunction that can also lead to [place].  Family history Brad Holmes (III.9) is the youngest of four children. His eldest brother IIII.6) also had hypercholesterolemia and died of liver cirrhosis. Brad Holmes has three children; twins ages 61 (IV.4-IV.5) and a 45 year  old daughter (IV.6) as are his nieces and nephews (IV.1-IV.3). Brad Holmes had another brother (III.7) who was killed at 26 and a sister (III.8) that died in an accident.  Gregs father (II.5) had hypercholesterolemia and died of a heart attack at age 1. All his siblings (II.1-II.4) also had hypercholesterolemia- two passed away at age 63 (II.2) and 4 (II.3) from a heart attack. The other two brothers also had hypercholesterolemia and CAD. Brad Holmes reports a paternal cousin (III.3) that died suddenly of a massive heart attack at age 18. Additionally, Gregs paternal grandfather (I.1) succumbed to a heart attack at 60.   There is no history of hypercholesterolemia or heart disease in his maternal lineage.   Impression and Plans  In summary, Brad Holmes was diagnosed with hyperlipidemia at age 21 in the absence of typical risk factors for hyperlipidemia. There is a significant family history of hypercholesterolemia and death from heart attack at a young age in his paternal relatives, including his father and paternal grandfather. It is likely that Brad Holmes has a genetic condition that is likely inherited from his father.  In light of his presentation and dramatic family history of heart attacks and death at a young age, genetic testing is highly recommended. Genetic testing should evaluate the major genes implicated in familial hypercholesterolemia. Since this is an autosomal dominant condition, his children are at a 50% risk of inheriting it. If he tests positive, his kids can be tested later to determine if they have inherited the familial pathogenic variant. Brad Holmes verbalized understanding of this and asked questions about the clinical utility of genetic testing towards his treatment.   In addition, we discussed the protections afforded by the Genetic Information Non-Discrimination Act (GINA). I explained to him that GINA protects him from losing him employment or health insurance based on his genotype. However, these  protections do not cover life insurance and disability. He verbalized understanding of this.  Please note that the patient has not been counseled in this visit on personal, cultural or ethical issues that he may face due to his heart condition.   Plans Brad Holmes is reluctant to proceed with testing at this time and would like to discuss this with his wife. He will reach out to [place] when he is ready for genetic testing.   Lattie Corns, Ph.D, [facility] Clinical Molecular Geneticist

## 2018-11-24 ENCOUNTER — Ambulatory Visit (HOSPITAL_COMMUNITY)
Admission: RE | Admit: 2018-11-24 | Discharge: 2018-11-24 | Disposition: A | Discharge status: Home / Self Care | Payer: No Typology Code available for payment source | Source: Ambulatory Visit | Attending: Interventional Radiology | Admitting: Interventional Radiology

## 2018-11-24 ENCOUNTER — Other Ambulatory Visit: Payer: Self-pay

## 2018-11-24 ENCOUNTER — Encounter (HOSPITAL_COMMUNITY)
Admission: RE | Disposition: A | Discharge status: Home / Self Care | Payer: Self-pay | Source: Ambulatory Visit | Attending: Interventional Radiology

## 2018-11-24 ENCOUNTER — Ambulatory Visit (HOSPITAL_COMMUNITY): Payer: No Typology Code available for payment source | Admitting: Vascular Surgery

## 2018-11-24 ENCOUNTER — Encounter (HOSPITAL_COMMUNITY): Payer: Self-pay | Admitting: Radiology

## 2018-11-24 DIAGNOSIS — Z7982 Long term (current) use of aspirin: Secondary | ICD-10-CM | POA: Insufficient documentation

## 2018-11-24 DIAGNOSIS — R200 Anesthesia of skin: Secondary | ICD-10-CM | POA: Insufficient documentation

## 2018-11-24 DIAGNOSIS — R531 Weakness: Secondary | ICD-10-CM | POA: Insufficient documentation

## 2018-11-24 DIAGNOSIS — I63511 Cerebral infarction due to unspecified occlusion or stenosis of right middle cerebral artery: Secondary | ICD-10-CM

## 2018-11-24 DIAGNOSIS — I6521 Occlusion and stenosis of right carotid artery: Secondary | ICD-10-CM | POA: Diagnosis not present

## 2018-11-24 DIAGNOSIS — I10 Essential (primary) hypertension: Secondary | ICD-10-CM | POA: Diagnosis not present

## 2018-11-24 DIAGNOSIS — E7800 Pure hypercholesterolemia, unspecified: Secondary | ICD-10-CM | POA: Diagnosis not present

## 2018-11-24 DIAGNOSIS — Z8673 Personal history of transient ischemic attack (TIA), and cerebral infarction without residual deficits: Secondary | ICD-10-CM | POA: Insufficient documentation

## 2018-11-24 DIAGNOSIS — Z7902 Long term (current) use of antithrombotics/antiplatelets: Secondary | ICD-10-CM | POA: Diagnosis not present

## 2018-11-24 DIAGNOSIS — Z79899 Other long term (current) drug therapy: Secondary | ICD-10-CM | POA: Insufficient documentation

## 2018-11-24 DIAGNOSIS — I672 Cerebral atherosclerosis: Secondary | ICD-10-CM | POA: Diagnosis not present

## 2018-11-24 DIAGNOSIS — I6601 Occlusion and stenosis of right middle cerebral artery: Secondary | ICD-10-CM | POA: Insufficient documentation

## 2018-11-24 DIAGNOSIS — R29810 Facial weakness: Secondary | ICD-10-CM | POA: Insufficient documentation

## 2018-11-24 HISTORY — PX: IR 3D INDEPENDENT WKST: IMG2385

## 2018-11-24 HISTORY — PX: IR ANGIO VERTEBRAL SEL SUBCLAVIAN INNOMINATE UNI R MOD SED: IMG5365

## 2018-11-24 HISTORY — PX: RADIOLOGY WITH ANESTHESIA: SHX6223

## 2018-11-24 HISTORY — PX: IR NEURO EACH ADD'L AFTER BASIC UNI RIGHT (MS): IMG5374

## 2018-11-24 HISTORY — PX: IR ANGIO INTRA EXTRACRAN SEL INTERNAL CAROTID UNI R MOD SED: IMG5362

## 2018-11-24 LAB — CBC WITH DIFFERENTIAL/PLATELET
Abs Immature Granulocytes: 0.01 10*3/uL (ref 0.00–0.07)
Basophils Absolute: 0 10*3/uL (ref 0.0–0.1)
Basophils Relative: 1 %
Eosinophils Absolute: 0.2 10*3/uL (ref 0.0–0.5)
Eosinophils Relative: 2 %
HCT: 43.7 % (ref 39.0–52.0)
Hemoglobin: 15.1 g/dL (ref 13.0–17.0)
Immature Granulocytes: 0 %
Lymphocytes Relative: 25 %
Lymphs Abs: 1.6 10*3/uL (ref 0.7–4.0)
MCH: 30.8 pg (ref 26.0–34.0)
MCHC: 34.6 g/dL (ref 30.0–36.0)
MCV: 89 fL (ref 80.0–100.0)
Monocytes Absolute: 0.7 10*3/uL (ref 0.1–1.0)
Monocytes Relative: 10 %
Neutro Abs: 4 10*3/uL (ref 1.7–7.7)
Neutrophils Relative %: 62 %
Platelets: 256 10*3/uL (ref 150–400)
RBC: 4.91 MIL/uL (ref 4.22–5.81)
RDW: 12.8 % (ref 11.5–15.5)
WBC: 6.4 10*3/uL (ref 4.0–10.5)
nRBC: 0 % (ref 0.0–0.2)

## 2018-11-24 LAB — URINALYSIS, COMPLETE (UACMP) WITH MICROSCOPIC
Bacteria, UA: NONE SEEN
Bilirubin Urine: NEGATIVE
Glucose, UA: NEGATIVE mg/dL
Hgb urine dipstick: NEGATIVE
Ketones, ur: NEGATIVE mg/dL
Nitrite: NEGATIVE
Protein, ur: NEGATIVE mg/dL
Specific Gravity, Urine: 1.015 (ref 1.005–1.030)
pH: 6 (ref 5.0–8.0)

## 2018-11-24 LAB — BASIC METABOLIC PANEL
Anion gap: 10 (ref 5–15)
BUN: 16 mg/dL (ref 6–20)
CO2: 26 mmol/L (ref 22–32)
Calcium: 9.3 mg/dL (ref 8.9–10.3)
Chloride: 104 mmol/L (ref 98–111)
Creatinine, Ser: 0.93 mg/dL (ref 0.61–1.24)
GFR calc Af Amer: >60 mL/min (ref >60)
GFR calc non Af Amer: >60 mL/min (ref >60)
Glucose, Bld: 101 mg/dL — ABNORMAL HIGH (ref 70–99)
Potassium: 4.7 mmol/L (ref 3.5–5.1)
Sodium: 140 mmol/L (ref 135–145)

## 2018-11-24 LAB — PLATELET INHIBITION P2Y12: Platelet Function  P2Y12: 50 [PRU] — ABNORMAL LOW (ref 182–335)

## 2018-11-24 LAB — PROTIME-INR
INR: 1 (ref 0.8–1.2)
Prothrombin Time: 12.7 seconds (ref 11.4–15.2)

## 2018-11-24 LAB — POCT ACTIVATED CLOTTING TIME: Activated Clotting Time: 164 seconds

## 2018-11-24 SURGERY — RADIOLOGY WITH ANESTHESIA
Anesthesia: General

## 2018-11-24 MED ORDER — FENTANYL CITRATE (PF) 100 MCG/2ML IJ SOLN: 25.0000 ug | INTRAMUSCULAR | Status: DC | PRN

## 2018-11-24 MED ORDER — CEFAZOLIN SODIUM-DEXTROSE 2-4 GM/100ML-% IV SOLN
INTRAVENOUS | Status: AC
  Filled 2018-11-24: qty 100

## 2018-11-24 MED ORDER — IOHEXOL 300 MG/ML  SOLN
150.0000 mL | Freq: Once | INTRAMUSCULAR | Status: AC | PRN
  Administered 2018-11-24: 15 mL via INTRA_ARTERIAL

## 2018-11-24 MED ORDER — ACETAMINOPHEN 10 MG/ML IV SOLN: 1000.0000 mg | Freq: Once | INTRAVENOUS | Status: DC | PRN

## 2018-11-24 MED ORDER — ASPIRIN EC 325 MG PO TBEC
325.0000 mg | DELAYED_RELEASE_TABLET | ORAL | Status: AC
  Administered 2018-11-24: 325 mg via ORAL
  Filled 2018-11-24: qty 1

## 2018-11-24 MED ORDER — EPHEDRINE SULFATE-NACL 50-0.9 MG/10ML-% IV SOSY
PREFILLED_SYRINGE | INTRAVENOUS | Status: DC | PRN
  Administered 2018-11-24: 10 mg via INTRAVENOUS
  Administered 2018-11-24: 5 mg via INTRAVENOUS

## 2018-11-24 MED ORDER — IOHEXOL 300 MG/ML  SOLN
150.0000 mL | Freq: Once | INTRAMUSCULAR | Status: AC | PRN
  Administered 2018-11-24: 11:00:00 75 mL via INTRA_ARTERIAL

## 2018-11-24 MED ORDER — NIMODIPINE 30 MG PO CAPS
0.0000 mg | ORAL_CAPSULE | ORAL | Status: DC
  Filled 2018-11-24: qty 2

## 2018-11-24 MED ORDER — MIDAZOLAM HCL 5 MG/5ML IJ SOLN
INTRAMUSCULAR | Status: DC | PRN
  Administered 2018-11-24: 2 mg via INTRAVENOUS

## 2018-11-24 MED ORDER — LIDOCAINE 2% (20 MG/ML) 5 ML SYRINGE
INTRAMUSCULAR | Status: DC | PRN
  Administered 2018-11-24: 100 mg via INTRAVENOUS

## 2018-11-24 MED ORDER — FENTANYL CITRATE (PF) 250 MCG/5ML IJ SOLN
INTRAMUSCULAR | Status: DC | PRN
  Administered 2018-11-24 (×2): 50 ug via INTRAVENOUS

## 2018-11-24 MED ORDER — CEFAZOLIN SODIUM-DEXTROSE 2-4 GM/100ML-% IV SOLN
2.0000 g | INTRAVENOUS | Status: AC
  Administered 2018-11-24: 2 g via INTRAVENOUS
  Filled 2018-11-24: qty 100

## 2018-11-24 MED ORDER — ROCURONIUM BROMIDE 10 MG/ML (PF) SYRINGE
PREFILLED_SYRINGE | INTRAVENOUS | Status: DC | PRN
  Administered 2018-11-24 (×2): 20 mg via INTRAVENOUS
  Administered 2018-11-24: 60 mg via INTRAVENOUS

## 2018-11-24 MED ORDER — LACTATED RINGERS IV SOLN
INTRAVENOUS | Status: DC | PRN
  Administered 2018-11-24: 07:00:00 via INTRAVENOUS

## 2018-11-24 MED ORDER — SODIUM CHLORIDE 0.9 % IV SOLN
INTRAVENOUS | Status: DC | PRN
  Administered 2018-11-24: 25 ug/min via INTRAVENOUS

## 2018-11-24 MED ORDER — PROPOFOL 10 MG/ML IV BOLUS
INTRAVENOUS | Status: DC | PRN
  Administered 2018-11-24: 20 mg via INTRAVENOUS
  Administered 2018-11-24: 10 mg via INTRAVENOUS
  Administered 2018-11-24: 170 mg via INTRAVENOUS

## 2018-11-24 MED ORDER — PHENYLEPHRINE 40 MCG/ML (10ML) SYRINGE FOR IV PUSH (FOR BLOOD PRESSURE SUPPORT)
PREFILLED_SYRINGE | INTRAVENOUS | Status: DC | PRN
  Administered 2018-11-24 (×2): 80 ug via INTRAVENOUS
  Administered 2018-11-24: 40 ug via INTRAVENOUS
  Administered 2018-11-24 (×2): 80 ug via INTRAVENOUS

## 2018-11-24 MED ORDER — PROMETHAZINE HCL 25 MG/ML IJ SOLN: 6.2500 mg | INTRAMUSCULAR | Status: DC | PRN

## 2018-11-24 MED ORDER — SODIUM CHLORIDE 0.9 % IV SOLN: INTRAVENOUS | Status: AC

## 2018-11-24 MED ORDER — SUGAMMADEX SODIUM 200 MG/2ML IV SOLN
INTRAVENOUS | Status: DC | PRN
  Administered 2018-11-24: 200 mg via INTRAVENOUS

## 2018-11-24 MED ORDER — ONDANSETRON HCL 4 MG/2ML IJ SOLN
INTRAMUSCULAR | Status: DC | PRN
  Administered 2018-11-24: 4 mg via INTRAVENOUS

## 2018-11-24 MED ORDER — SODIUM CHLORIDE 0.9 % IV SOLN: INTRAVENOUS | Status: DC

## 2018-11-24 MED ORDER — HEPARIN SODIUM (PORCINE) 1000 UNIT/ML IJ SOLN
INTRAMUSCULAR | Status: DC | PRN
  Administered 2018-11-24 (×3): 1000 [IU] via INTRAVENOUS

## 2018-11-24 MED ORDER — CLOPIDOGREL BISULFATE 75 MG PO TABS
75.0000 mg | ORAL_TABLET | ORAL | Status: AC
  Administered 2018-11-24: 75 mg via ORAL
  Filled 2018-11-24: qty 1

## 2018-11-24 NOTE — Transfer of Care (Signed)
Immediate Anesthesia Transfer of Care Note  Patient: Brad Holmes  Procedure(s) Performed: RADIOLOGY WITH ANESTHESIA  STENTING (N/A )  Patient Location: PACU  Anesthesia Type:General  Level of Consciousness: awake, alert , oriented and patient cooperative  Airway & Oxygen Therapy: Patient Spontanous Breathing and Patient connected to nasal cannula oxygen  Post-op Assessment: Report given to RN and Post -op Vital signs reviewed and stable  Post vital signs: Reviewed and stable  Last Vitals:  Vitals Value Taken Time  BP    Temp    Pulse    Resp    SpO2      Last Pain:  Vitals:   11/24/18 0715  TempSrc: Oral  PainSc: 0-No pain         Complications: No apparent anesthesia complications

## 2018-11-24 NOTE — Procedures (Signed)
S/P RT CCA and RT VA angiogram . Findings. 1.Approx 50 % stenosis of origin of sup division of RT MCA.. 2. Focal stenosis RT ICA subclinoid stenosis of approx 50 to 60 %. 3.Focal stenosis of prox branch iof the inf division of RT MCA

## 2018-11-24 NOTE — Anesthesia Postprocedure Evaluation (Signed)
Anesthesia Post Note  Patient: Brad Holmes  Procedure(s) Performed: RADIOLOGY WITH ANESTHESIA  STENTING (N/A )     Patient location during evaluation: PACU Anesthesia Type: General Level of consciousness: awake and alert and oriented Pain management: pain level controlled Vital Signs Assessment: post-procedure vital signs reviewed and stable Respiratory status: spontaneous breathing, nonlabored ventilation and respiratory function stable Cardiovascular status: blood pressure returned to baseline Postop Assessment: no apparent nausea or vomiting Anesthetic complications: no    Last Vitals:  Vitals:   11/24/18 1222 11/24/18 1252  BP: (!) 124/91 133/80  Pulse: 93 88  Resp: 19 19  Temp:    SpO2: 97% 95%    Last Pain:  Vitals:   11/24/18 1252  TempSrc:   PainSc: 0-No pain                 Brennan Bailey

## 2018-11-24 NOTE — Anesthesia Procedure Notes (Signed)
Procedure Name: Intubation Date/Time: 11/24/2018 9:22 AM Performed by: Colin Benton, CRNA Pre-anesthesia Checklist: Patient identified, Suction available, Emergency Drugs available and Patient being monitored Patient Re-evaluated:Patient Re-evaluated prior to induction Oxygen Delivery Method: Circle system utilized Preoxygenation: Pre-oxygenation with 100% oxygen Induction Type: IV induction Ventilation: Mask ventilation without difficulty Laryngoscope Size: Miller and 2 Grade View: Grade I Tube type: Oral Tube size: 8.0 mm Number of attempts: 1 Airway Equipment and Method: Stylet Placement Confirmation: ETT inserted through vocal cords under direct vision,  positive ETCO2 and breath sounds checked- equal and bilateral Secured at: 23 cm Tube secured with: Tape Dental Injury: Teeth and Oropharynx as per pre-operative assessment

## 2018-11-24 NOTE — H&P (Signed)
Chief Complaint: Patient was seen in consultation today for cerebral arteriogram with possible RMCA intervention at the request of Dr Lavera Guise  Supervising Physician: Luanne Bras  Patient Status: [facility] - Out-pt  History of Present Illness: Brad Holmes is a 48 y.o. male   TIA symptoms 11/25/18 To ED Left sided weakness; facial droop and left headache  CTA revealed: Severe stenosis of the distal M1 segment of the right MCA with widely patent distal branches. MRI Brain showed: Brain: No acute infarction, hemorrhage, hydrocephalus, extra-axial collection or mass lesion. Normal white matter.  Symptoms resolved on own quickly  Arteriogram:IMPRESSION: High-grade approximately 80-90% stenosis of the middle cerebral artery in the distal M1 segment. Approximately 65-70% stenosis of the right internal carotid artery supraclinoid segments. Mild-to-moderate intracranial arteriosclerotic disease of the left posteroinferior cerebellar artery proximally, and a mild degree involving the inferior division of the left middle cerebral artery proximally. Poor filling of the right anterior cerebral artery A1 segment.  Seen and evaluated with Dr Erlinda Hong and Dr Estanislado Pandy Rec: arteriogram with possible R MCA intervention Scheduled now for procedure  Taking ASA/Plavix daily  Past Medical History:  Diagnosis Date   Hypercholesterolemia    Hypertension    Transient ischemic attack     Past Surgical History:  Procedure Laterality Date   CARDIAC CATHETERIZATION     05/12/09   EYE SURGERY     IR ANGIO INTRA EXTRACRAN SEL COM CAROTID INNOMINATE BILAT MOD SED  11/14/2018   IR ANGIO VERTEBRAL SEL VERTEBRAL BILAT MOD SED  11/14/2018   WISDOM TOOTH EXTRACTION      Allergies: No known allergies  Medications: Prior to Admission medications   Medication Sig Start Date End Date Taking? Authorizing Provider  aspirin 325 MG EC tablet Take 1 tablet (325 mg total) by mouth daily. 11/15/18    Mullis, Kiersten P, DO  clopidogrel (PLAVIX) 75 MG tablet Take 1 tablet (75 mg total) by mouth daily. 11/16/18   Mullis, Kiersten P, DO  rosuvastatin (CRESTOR) 40 MG tablet Take 1 tablet (40 mg total) by mouth daily. 08/12/18   Zenia Resides, MD     Family History  Problem Relation Age of Onset   Colon cancer Neg Hx    Colon polyps Neg Hx    Esophageal cancer Neg Hx    Rectal cancer Neg Hx    Stomach cancer Neg Hx     Social History   Socioeconomic History   Marital status: Married    Spouse name: Not on file   Number of children: Not on file   Years of education: Not on file   Highest education level: Not on file  Occupational History   Not on file  Social Needs   Financial resource strain: Not on file   Food insecurity    Worry: Not on file    Inability: Not on file   Transportation needs    Medical: Not on file    Non-medical: Not on file  Tobacco Use   Smoking status: Never Smoker   Smokeless tobacco: Never Used  Substance and Sexual Activity   Alcohol use: Yes    Comment: rare   Drug use: No   Sexual activity: Not on file  Lifestyle   Physical activity    Days per week: Not on file    Minutes per session: Not on file   Stress: Not on file  Relationships   Social connections    Talks on phone: Not on file  Gets together: Not on file    Attends religious service: Not on file    Active member of club or organization: Not on file    Attends meetings of clubs or organizations: Not on file    Relationship status: Not on file  Other Topics Concern   Not on file  Social History Narrative   Not on file    Review of Systems: A 12 point ROS discussed and pertinent positives are indicated in the HPI above.  All other systems are negative.  Review of Systems  Constitutional: Negative for activity change, fatigue and fever.  HENT: Negative for tinnitus and trouble swallowing.   Eyes: Negative for visual disturbance.  Respiratory:  Negative for cough and shortness of breath.   Gastrointestinal: Negative for abdominal pain.  Musculoskeletal: Negative for back pain.  Neurological: Negative for dizziness, tremors, seizures, syncope, facial asymmetry, speech difficulty, weakness, light-headedness, numbness and headaches.  Psychiatric/Behavioral: Negative for behavioral problems and confusion.    Vital Signs: There were no vitals taken for this visit.  Physical Exam Vitals signs reviewed.  Constitutional:      Appearance: Normal appearance.  HENT:     Head: Atraumatic.     Mouth/Throat:     Mouth: Mucous membranes are moist.  Eyes:     Extraocular Movements: Extraocular movements intact.  Cardiovascular:     Rate and Rhythm: Normal rate and regular rhythm.     Heart sounds: Normal heart sounds.  Pulmonary:     Effort: Pulmonary effort is normal.     Breath sounds: Normal breath sounds.  Abdominal:     Palpations: Abdomen is soft.  Musculoskeletal: Normal range of motion.     Right lower leg: No edema.  Skin:    General: Skin is warm and dry.  Neurological:     Mental Status: He is alert and oriented to person, place, and time.  Psychiatric:        Mood and Affect: Mood normal.        Behavior: Behavior normal.        Thought Content: Thought content normal.        Judgment: Judgment normal.     Imaging: Ct Code Stroke Cta Head W/wo Contrast  Result Date: 11/14/2018 CLINICAL DATA:  Slurred speech and left-sided weakness EXAM: CT ANGIOGRAPHY HEAD AND NECK CT PERFUSION BRAIN TECHNIQUE: Multidetector CT imaging of the head and neck was performed using the standard protocol during bolus administration of intravenous contrast. Multiplanar CT image reconstructions and MIPs were obtained to evaluate the vascular anatomy. Carotid stenosis measurements (when applicable) are obtained utilizing NASCET criteria, using the distal internal carotid diameter as the denominator. Multiphase CT imaging of the brain was  performed following IV bolus contrast injection. Subsequent parametric perfusion maps were calculated using RAPID software. CONTRAST:  162mL OMNIPAQUE IOHEXOL 350 MG/ML SOLN COMPARISON:  Head CT 11/13/2018 FINDINGS: CTA NECK FINDINGS SKELETON: There is no bony spinal canal stenosis. No lytic or blastic lesion. OTHER NECK: Normal pharynx, larynx and major salivary glands. No cervical lymphadenopathy. Unremarkable thyroid gland. UPPER CHEST: No pneumothorax or pleural effusion. No nodules or masses. AORTIC ARCH: There is no calcific atherosclerosis of the aortic arch. There is no aneurysm, dissection or hemodynamically significant stenosis of the visualized ascending aorta and aortic arch. Conventional 3 vessel aortic branching pattern. The visualized proximal subclavian arteries are widely patent. RIGHT CAROTID SYSTEM: --Common carotid artery: Widely patent origin without common carotid artery dissection or aneurysm. --Internal carotid artery: Normal without aneurysm,  dissection or stenosis. --External carotid artery: No acute abnormality. LEFT CAROTID SYSTEM: --Common carotid artery: Widely patent origin without common carotid artery dissection or aneurysm. --Internal carotid artery: Normal without aneurysm, dissection or stenosis. --External carotid artery: No acute abnormality. VERTEBRAL ARTERIES: Codominant configuration. Both origins are clearly patent. No dissection, occlusion or flow-limiting stenosis to the skull base (V1-V3 segments). CTA HEAD FINDINGS POSTERIOR CIRCULATION: --Vertebral arteries: Normal V4 segments. --Posterior inferior cerebellar arteries (PICA): Patent origins from the vertebral arteries. --Anterior inferior cerebellar arteries (AICA): Patent origins from the basilar artery. 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 --Superior cerebellar arteries: Normal. --Posterior cerebral arteries: Normal. Both originate from the basilar artery. Posterior communicating arteries  (p-comm) are diminutive or absent. ANTERIOR CIRCULATION: --Intracranial internal carotid arteries: There is mild-to-moderate narrowing of the right carotid terminus. Normal left ICA. --Anterior cerebral arteries (ACA): Normal. Both A1 segments are present. Patent anterior communicating artery (a-comm). --Middle cerebral arteries (MCA): There is severe stenosis of the distal M1 segment of the right MCA. The distal MCA branches are widely patent. The left MCA is normal. VENOUS SINUSES: As permitted by contrast timing, patent. ANATOMIC VARIANTS: None Review of the MIP images confirms the above findings. CT Brain Perfusion Findings: ASPECTS: 10 CBF (<30%) Volume: 67mL Perfusion (Tmax>6.0s) volume: 92mL Mismatch Volume: 54mL Infarction Location:None IMPRESSION: 1. No core infarct or penumbra. 2. Severe stenosis of the distal M1 segment of the right MCA with widely patent distal branches. 3. Mild-to-moderate narrowing of the right carotid terminus. 4. Normal CTA of the neck. Discussed with Kerney Elbe at 2:55 AM on 11/14/2018. Electronically Signed   By: Ulyses Jarred M.D.   On: 11/14/2018 03:06   Dg Chest 1 View  Result Date: 11/14/2018 CLINICAL DATA:  Patient with chest pain. EXAM: CHEST  1 VIEW COMPARISON:  Chest radiograph 11/14/2018 FINDINGS: Monitoring leads overlie the patient. Stable cardiac and mediastinal contours. Minimal right basilar atelectasis. No pleural effusion or pneumothorax. Thoracic spine degenerative changes. IMPRESSION: Right basilar atelectasis. Electronically Signed   By: Lovey Newcomer M.D.   On: 11/14/2018 21:06   Ct Code Stroke Cta Neck W/wo Contrast  Result Date: 11/14/2018 CLINICAL DATA:  Slurred speech and left-sided weakness EXAM: CT ANGIOGRAPHY HEAD AND NECK CT PERFUSION BRAIN TECHNIQUE: Multidetector CT imaging of the head and neck was performed using the standard protocol during bolus administration of intravenous contrast. Multiplanar CT image reconstructions and MIPs were obtained  to evaluate the vascular anatomy. Carotid stenosis measurements (when applicable) are obtained utilizing NASCET criteria, using the distal internal carotid diameter as the denominator. Multiphase CT imaging of the brain was performed following IV bolus contrast injection. Subsequent parametric perfusion maps were calculated using RAPID software. CONTRAST:  149mL OMNIPAQUE IOHEXOL 350 MG/ML SOLN COMPARISON:  Head CT 11/13/2018 FINDINGS: CTA NECK FINDINGS SKELETON: There is no bony spinal canal stenosis. No lytic or blastic lesion. OTHER NECK: Normal pharynx, larynx and major salivary glands. No cervical lymphadenopathy. Unremarkable thyroid gland. UPPER CHEST: No pneumothorax or pleural effusion. No nodules or masses. AORTIC ARCH: There is no calcific atherosclerosis of the aortic arch. There is no aneurysm, dissection or hemodynamically significant stenosis of the visualized ascending aorta and aortic arch. Conventional 3 vessel aortic branching pattern. The visualized proximal subclavian arteries are widely patent. RIGHT CAROTID SYSTEM: --Common carotid artery: Widely patent origin without common carotid artery dissection or aneurysm. --Internal carotid artery: Normal without aneurysm, dissection or stenosis. --External carotid artery: No acute abnormality. LEFT CAROTID SYSTEM: --Common carotid artery: Widely patent origin without common  carotid artery dissection or aneurysm. --Internal carotid artery: Normal without aneurysm, dissection or stenosis. --External carotid artery: No acute abnormality. VERTEBRAL ARTERIES: Codominant configuration. Both origins are clearly patent. No dissection, occlusion or flow-limiting stenosis to the skull base (V1-V3 segments). CTA HEAD FINDINGS POSTERIOR CIRCULATION: --Vertebral arteries: Normal V4 segments. --Posterior inferior cerebellar arteries (PICA): Patent origins from the vertebral arteries. --Anterior inferior cerebellar arteries (AICA): Patent origins from the basilar  artery. 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 --Superior cerebellar arteries: Normal. --Posterior cerebral arteries: Normal. Both originate from the basilar artery. Posterior communicating arteries (p-comm) are diminutive or absent. ANTERIOR CIRCULATION: --Intracranial internal carotid arteries: There is mild-to-moderate narrowing of the right carotid terminus. Normal left ICA. --Anterior cerebral arteries (ACA): Normal. Both A1 segments are present. Patent anterior communicating artery (a-comm). --Middle cerebral arteries (MCA): There is severe stenosis of the distal M1 segment of the right MCA. The distal MCA branches are widely patent. The left MCA is normal. VENOUS SINUSES: As permitted by contrast timing, patent. ANATOMIC VARIANTS: None Review of the MIP images confirms the above findings. CT Brain Perfusion Findings: ASPECTS: 10 CBF (<30%) Volume: 12mL Perfusion (Tmax>6.0s) volume: 35mL Mismatch Volume: 74mL Infarction Location:None IMPRESSION: 1. No core infarct or penumbra. 2. Severe stenosis of the distal M1 segment of the right MCA with widely patent distal branches. 3. Mild-to-moderate narrowing of the right carotid terminus. 4. Normal CTA of the neck. Discussed with Kerney Elbe at 2:55 AM on 11/14/2018. Electronically Signed   By: Ulyses Jarred M.D.   On: 11/14/2018 03:06   Mr Brain Wo Contrast  Addendum Date: 11/14/2018   ADDENDUM REPORT: 11/14/2018 08:53 ADDENDUM: After further review and discussion with Dr. Erlinda Hong, there is a small hyperintensity in the right corona radiata on diffusion weighted imaging. This measures approximately 4 x 6 mm and may represent acute or subacute infarction. Electronically Signed   By: Franchot Gallo M.D.   On: 11/14/2018 08:53   Result Date: 11/14/2018 CLINICAL DATA:  TIA initial exam EXAM: MRI HEAD WITHOUT CONTRAST TECHNIQUE: Multiplanar, multiecho pulse sequences of the brain and surrounding structures were obtained without intravenous  contrast. COMPARISON:  CTA and CT perfusion 11/14/2018 FINDINGS: Brain: No acute infarction, hemorrhage, hydrocephalus, extra-axial collection or mass lesion. Normal white matter. Vascular: Normal arterial flow voids. Skull and upper cervical spine: Negative Sinuses/Orbits: Mild mucosal edema paranasal sinuses.  Normal orbit Other: None IMPRESSION: Normal MRI brain. Electronically Signed: By: Franchot Gallo M.D. On: 11/14/2018 07:18   Ct Code Stroke Cta Cerebral Perfusion W/wo Contrast  Result Date: 11/14/2018 CLINICAL DATA:  Slurred speech and left-sided weakness EXAM: CT ANGIOGRAPHY HEAD AND NECK CT PERFUSION BRAIN TECHNIQUE: Multidetector CT imaging of the head and neck was performed using the standard protocol during bolus administration of intravenous contrast. Multiplanar CT image reconstructions and MIPs were obtained to evaluate the vascular anatomy. Carotid stenosis measurements (when applicable) are obtained utilizing NASCET criteria, using the distal internal carotid diameter as the denominator. Multiphase CT imaging of the brain was performed following IV bolus contrast injection. Subsequent parametric perfusion maps were calculated using RAPID software. CONTRAST:  192mL OMNIPAQUE IOHEXOL 350 MG/ML SOLN COMPARISON:  Head CT 11/13/2018 FINDINGS: CTA NECK FINDINGS SKELETON: There is no bony spinal canal stenosis. No lytic or blastic lesion. OTHER NECK: Normal pharynx, larynx and major salivary glands. No cervical lymphadenopathy. Unremarkable thyroid gland. UPPER CHEST: No pneumothorax or pleural effusion. No nodules or masses. AORTIC ARCH: There is no calcific atherosclerosis of the aortic arch. There is no aneurysm,  dissection or hemodynamically significant stenosis of the visualized ascending aorta and aortic arch. Conventional 3 vessel aortic branching pattern. The visualized proximal subclavian arteries are widely patent. RIGHT CAROTID SYSTEM: --Common carotid artery: Widely patent origin without  common carotid artery dissection or aneurysm. --Internal carotid artery: Normal without aneurysm, dissection or stenosis. --External carotid artery: No acute abnormality. LEFT CAROTID SYSTEM: --Common carotid artery: Widely patent origin without common carotid artery dissection or aneurysm. --Internal carotid artery: Normal without aneurysm, dissection or stenosis. --External carotid artery: No acute abnormality. VERTEBRAL ARTERIES: Codominant configuration. Both origins are clearly patent. No dissection, occlusion or flow-limiting stenosis to the skull base (V1-V3 segments). CTA HEAD FINDINGS POSTERIOR CIRCULATION: --Vertebral arteries: Normal V4 segments. --Posterior inferior cerebellar arteries (PICA): Patent origins from the vertebral arteries. --Anterior inferior cerebellar arteries (AICA): Patent origins from the basilar artery. 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 --Superior cerebellar arteries: Normal. --Posterior cerebral arteries: Normal. Both originate from the basilar artery. Posterior communicating arteries (p-comm) are diminutive or absent. ANTERIOR CIRCULATION: --Intracranial internal carotid arteries: There is mild-to-moderate narrowing of the right carotid terminus. Normal left ICA. --Anterior cerebral arteries (ACA): Normal. Both A1 segments are present. Patent anterior communicating artery (a-comm). --Middle cerebral arteries (MCA): There is severe stenosis of the distal M1 segment of the right MCA. The distal MCA branches are widely patent. The left MCA is normal. VENOUS SINUSES: As permitted by contrast timing, patent. ANATOMIC VARIANTS: None Review of the MIP images confirms the above findings. CT Brain Perfusion Findings: ASPECTS: 10 CBF (<30%) Volume: 59mL Perfusion (Tmax>6.0s) volume: 50mL Mismatch Volume: 33mL Infarction Location:None IMPRESSION: 1. No core infarct or penumbra. 2. Severe stenosis of the distal M1 segment of the right MCA with widely patent distal  branches. 3. Mild-to-moderate narrowing of the right carotid terminus. 4. Normal CTA of the neck. Discussed with Kerney Elbe at 2:55 AM on 11/14/2018. Electronically Signed   By: Ulyses Jarred M.D.   On: 11/14/2018 03:06   Dg Chest Port 1 View  Result Date: 11/14/2018 CLINICAL DATA:  Weakness EXAM: PORTABLE CHEST 1 VIEW COMPARISON:  June 01, 2015 FINDINGS: The heart size and mediastinal contours are within normal limits. Both lungs are clear. The visualized skeletal structures are unremarkable. IMPRESSION: No acute cardiopulmonary process. Electronically Signed   By: Prudencio Pair M.D.   On: 11/14/2018 03:01   Ct Head Code Stroke Wo Contrast  Result Date: 11/14/2018 CLINICAL DATA:  Code stroke.  Slurred speech and left-sided weakness EXAM: CT HEAD WITHOUT CONTRAST TECHNIQUE: Contiguous axial images were obtained from the base of the skull through the vertex without intravenous contrast. COMPARISON:  None. FINDINGS: Brain: There is no mass, hemorrhage or extra-axial collection. The size and configuration of the ventricles and extra-axial CSF spaces are normal. The brain parenchyma is normal, without evidence of acute or chronic infarction. Vascular: No abnormal hyperdensity of the major intracranial arteries or dural venous sinuses. No intracranial atherosclerosis. Skull: The visualized skull base, calvarium and extracranial soft tissues are normal. Sinuses/Orbits: No fluid levels or advanced mucosal thickening of the visualized paranasal sinuses. No mastoid or middle ear effusion. The orbits are normal. ASPECTS [facility] Stroke Program Early CT Score) - Ganglionic level infarction (caudate, lentiform nuclei, internal capsule, insula, M1-M3 cortex): 7 - Supraganglionic infarction (M4-M6 cortex): 3 Total score (0-10 with 10 being normal): 10 IMPRESSION: 1. No intracranial hemorrhage. 2. ASPECTS is 10. These results were communicated to Dr. Kerney Elbe at 2:13 am on 11/14/2018 by text page via the [facility]  messaging system.  Electronically Signed   By: Ulyses Jarred M.D.   On: 11/14/2018 02:14   Ir Angio Intra Extracran Sel Com Carotid Innominate Bilat Mod Sed  Result Date: 11/17/2018 CLINICAL DATA:  New onset of left-sided weakness, dysarthria and left facial droop. High-grade stenosis of the right middle cerebral artery on CT angiogram of the head and neck. EXAM: IR ANGIO VERTEBRAL SEL VERTEBRAL BILAT MOD SED; BILATERAL COMMON CAROTID AND INNOMINATE ANGIOGRAPHY COMPARISON:  CT angiogram of the head and neck of 11/14/2018, and MRI 11/14/2018. MEDICATIONS: Heparin 1000 units IV; no antibiotic was administered within 1 hour of the procedure. ANESTHESIA/SEDATION: Versed 1 mg IV; Fentanyl 25 mcg IV Moderate Sedation Time:  24 minutes The patient was continuously monitored during the procedure by the interventional radiology nurse under my direct supervision. CONTRAST:  Isovue 300 approximately 60 mL. FLUOROSCOPY TIME:  Fluoroscopy Time: 5 minutes 42 seconds (829 mGy). COMPLICATIONS: None immediate. TECHNIQUE: Informed written consent was obtained from the patient after a thorough discussion of the procedural risks, benefits and alternatives. All questions were addressed. Maximal Sterile Barrier Technique was utilized including caps, mask, sterile gowns, sterile gloves, sterile drape, hand hygiene and skin antiseptic. A timeout was performed prior to the initiation of the procedure. The right groin was prepped and draped in the usual sterile fashion. Thereafter using modified Seldinger technique, transfemoral access into the right common femoral artery was obtained without difficulty. Over a 0.035 inch guidewire, a 5 French Pinnacle sheath was inserted. Through this, and also over 0.035 inch guidewire, a 5 [place] JB 1 catheter was advanced to the aortic arch region and selectively positioned in the right common carotid artery, the right vertebral artery, the left common carotid artery and the left vertebral artery.  FINDINGS: Right common carotid arteriogram demonstrates the right external carotid artery and its major branches to be widely patent. The right internal carotid artery at the bulb has a smooth shallow plaque along the posterior wall without evidence of significant stenosis or of ulcerations. More distally the right internal carotid artery opacifies normally to the cranial skull base. The petrous and cavernous segments are widely patent. There is tapered narrowing of the right internal carotid supraclinoid segment of 65-70%. Distal to this the right middle cerebral artery M1 segment in its proximal 2/3 is widely patent. There is tapered narrowing of the distal right middle cerebral M1 segment of approximately 80-90% with wide patency of the trifurcation branches. Anterior choroidal artery is widely patent. Hypoplastic right anterior cerebral A1 segment is seen. The right vertebral artery origin demonstrates wide patency. The vessel is seen to opacify to the cranial skull base. Wide patency is seen of the right vertebrobasilar junction and the right posterior-inferior cerebellar artery. 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 The opacified portion of the basilar artery, the posterior cerebral arteries, the superior cerebellar arteries and the anterior-inferior cerebellar arteries is patent into the capillary and venous phases. Unopacified blood is seen in the basilar artery from the contralateral vertebral artery. Transient retrograde opacification via the right posterior communicating artery of the right internal carotid supraclinoid segment distal to the above-mentioned stenosis is seen. The left common carotid arteriogram demonstrates the left external carotid artery and its major branches to be widely patent. The left internal carotid artery at the bulb to the cranial skull base demonstrates wide patency. The petrous, cavernous and the supraclinoid segments are widely  patent. The left middle cerebral artery and the left anterior cerebral artery opacify into the capillary and venous  phases. There is mild stenosis of the proximal inferior division of the left middle cerebral artery probably due to an intracranial arteriosclerotic plaque. Prompt cross filling via the anterior communicating artery of the right anterior cerebral artery A2 segment and distally is noted. Venous phase demonstrates mild attenuation of the mid transverse left dural sinus. The origin of the left vertebral artery demonstrates approximately 50% stenosis. More distally the vessel is seen to opacify to the cranial skull base. Wide patency is seen of the left posterior-inferior cerebellar artery which demonstrates focal areas of caliber irregularity most consistent with intracranial arteriosclerotic changes. Opacified portion of the basilar artery, the posterior cerebral arteries, the superior cerebellar arteries and the anterior-inferior cerebellar arteries is seen. Unopacified blood is seen in the basilar artery from the contralateral vertebral artery. IMPRESSION: High-grade approximately 80-90% stenosis of the middle cerebral artery in the distal M1 segment. Approximately 65-70% stenosis of the right internal carotid artery supraclinoid segments. Mild-to-moderate intracranial arteriosclerotic disease of the left posteroinferior cerebellar artery proximally, and a mild degree involving the inferior division of the left middle cerebral artery proximally. Poor filling of the right anterior cerebral artery A1 segment. PLAN: Findings reviewed with the patient and the patient's wife, and also with the referring stroke neurologist. Electronically Signed   By: Luanne Bras M.D.   On: 11/14/2018 14:11   Ir Angio Vertebral Sel Vertebral Bilat Mod Sed  Result Date: 11/17/2018 CLINICAL DATA:  New onset of left-sided weakness, dysarthria and left facial droop. High-grade stenosis of the right middle cerebral  artery on CT angiogram of the head and neck. EXAM: IR ANGIO VERTEBRAL SEL VERTEBRAL BILAT MOD SED; BILATERAL COMMON CAROTID AND INNOMINATE ANGIOGRAPHY COMPARISON:  CT angiogram of the head and neck of 11/14/2018, and MRI 11/14/2018. MEDICATIONS: Heparin 1000 units IV; no antibiotic was administered within 1 hour of the procedure. ANESTHESIA/SEDATION: Versed 1 mg IV; Fentanyl 25 mcg IV Moderate Sedation Time:  24 minutes The patient was continuously monitored during the procedure by the interventional radiology nurse under my direct supervision. CONTRAST:  Isovue 300 approximately 60 mL. FLUOROSCOPY TIME:  Fluoroscopy Time: 5 minutes 42 seconds (829 mGy). COMPLICATIONS: None immediate. TECHNIQUE: Informed written consent was obtained from the patient after a thorough discussion of the procedural risks, benefits and alternatives. All questions were addressed. Maximal Sterile Barrier Technique was utilized including caps, mask, sterile gowns, sterile gloves, sterile drape, hand hygiene and skin antiseptic. A timeout was performed prior to the initiation of the procedure. The right groin was prepped and draped in the usual sterile fashion. Thereafter using modified Seldinger technique, transfemoral access into the right common femoral artery was obtained without difficulty. Over a 0.035 inch guidewire, a 5 French Pinnacle sheath was inserted. Through this, and also over 0.035 inch guidewire, a 5 [place] JB 1 catheter was advanced to the aortic arch region and selectively positioned in the right common carotid artery, the right vertebral artery, the left common carotid artery and the left vertebral artery. FINDINGS: Right common carotid arteriogram demonstrates the right external carotid artery and its major branches to be widely patent. The right internal carotid artery at the bulb has a smooth shallow plaque along the posterior wall without evidence of significant stenosis or of ulcerations. More distally the right  internal carotid artery opacifies normally to the cranial skull base. The petrous and cavernous segments are widely patent. There is tapered narrowing of the right internal carotid supraclinoid segment of 65-70%. Distal to this the right middle cerebral artery M1 segment  in its proximal 2/3 is widely patent. There is tapered narrowing of the distal right middle cerebral M1 segment of approximately 80-90% with wide patency of the trifurcation branches. Anterior choroidal artery is widely patent. Hypoplastic right anterior cerebral A1 segment is seen. The right vertebral artery origin demonstrates wide patency. The vessel is seen to opacify to the cranial skull base. Wide patency is seen of the right vertebrobasilar junction and the right posterior-inferior cerebellar artery. 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 The opacified portion of the basilar artery, the posterior cerebral arteries, the superior cerebellar arteries and the anterior-inferior cerebellar arteries is patent into the capillary and venous phases. Unopacified blood is seen in the basilar artery from the contralateral vertebral artery. Transient retrograde opacification via the right posterior communicating artery of the right internal carotid supraclinoid segment distal to the above-mentioned stenosis is seen. The left common carotid arteriogram demonstrates the left external carotid artery and its major branches to be widely patent. The left internal carotid artery at the bulb to the cranial skull base demonstrates wide patency. The petrous, cavernous and the supraclinoid segments are widely patent. The left middle cerebral artery and the left anterior cerebral artery opacify into the capillary and venous phases. There is mild stenosis of the proximal inferior division of the left middle cerebral artery probably due to an intracranial arteriosclerotic plaque. Prompt cross filling via the anterior  communicating artery of the right anterior cerebral artery A2 segment and distally is noted. Venous phase demonstrates mild attenuation of the mid transverse left dural sinus. The origin of the left vertebral artery demonstrates approximately 50% stenosis. More distally the vessel is seen to opacify to the cranial skull base. Wide patency is seen of the left posterior-inferior cerebellar artery which demonstrates focal areas of caliber irregularity most consistent with intracranial arteriosclerotic changes. Opacified portion of the basilar artery, the posterior cerebral arteries, the superior cerebellar arteries and the anterior-inferior cerebellar arteries is seen. Unopacified blood is seen in the basilar artery from the contralateral vertebral artery. IMPRESSION: High-grade approximately 80-90% stenosis of the middle cerebral artery in the distal M1 segment. Approximately 65-70% stenosis of the right internal carotid artery supraclinoid segments. Mild-to-moderate intracranial arteriosclerotic disease of the left posteroinferior cerebellar artery proximally, and a mild degree involving the inferior division of the left middle cerebral artery proximally. Poor filling of the right anterior cerebral artery A1 segment. PLAN: Findings reviewed with the patient and the patient's wife, and also with the referring stroke neurologist. Electronically Signed   By: Luanne Bras M.D.   On: 11/14/2018 14:11    Labs:  CBC: Recent Labs    02/14/18 2240 08/11/18 0942 11/14/18 0248 11/14/18 0330 11/15/18 0335  WBC 8.2 5.9 6.4  --  7.6  HGB 13.9 15.5 13.7 13.3 14.6  HCT 42.8 44.7 40.5 39.0 43.3  PLT 257 286 244  --  256    COAGS: Recent Labs    11/14/18 0248  INR 0.9  APTT 28    BMP: Recent Labs    02/14/18 2240 08/11/18 0942 11/14/18 0248 11/14/18 0330 11/15/18 0335  NA 136 138 136 138 139  K 3.7 4.5 3.9 3.8 4.1  CL 104 101 106 104 108  CO2 26 25 22   --  24  GLUCOSE 104* 86 107* 98 86    BUN 19 10 12 13 7   CALCIUM 9.0 10.2 8.8*  --  9.1  CREATININE 0.96 0.92 0.97 0.90 0.89  GFRNONAA >60 97 >60  --  >  60  GFRAA >60 112 >60  --  >60    LIVER FUNCTION TESTS: Recent Labs    02/10/18 1116 08/11/18 0942 11/14/18 0248  BILITOT 0.5 0.4 0.5  AST 21 36 21  ALT 35 89* 35  ALKPHOS  --  91 63  PROT 6.9 6.8 5.8*  ALBUMIN  --  4.5 3.4*    TUMOR MARKERS: No results for input(s): AFPTM, CEA, CA199, CHROMGRNA in the last 8760 hours.  Assessment and Plan:  Left side numbness; weakness and facial droop and came to ED 11/14/18 Symptoms resolved on own No symptoms have recurred Imaging revealed R Middle cerebral artery stenosis Now scheduled for Cerebral arteriogram with possible intervention Risks and benefits of cerebral angiogram with intervention were discussed with the patient including, but not limited to bleeding, infection, vascular injury, contrast induced renal failure, stroke or even death.  This interventional procedure involves the use of X-rays and because of the nature of the planned procedure, it is possible that we will have prolonged use of X-ray fluoroscopy.  Potential radiation risks to you include (but are not limited to) the following: - A slightly elevated risk for cancer  several years later in life. This risk is typically less than 0.5% percent. This risk is low in comparison to the normal incidence of human cancer, which is 33% for women and 50% for men according to the [place]. - Radiation induced injury can include skin redness, resembling a rash, tissue breakdown / ulcers and hair loss (which can be temporary or permanent).   The likelihood of either of these occurring depends on the difficulty of the procedure and whether you are sensitive to radiation due to previous procedures, disease, or genetic conditions.   IF your procedure requires a prolonged use of radiation, you will be notified and given written instructions for further  action.  It is your responsibility to monitor the irradiated area for the 2 weeks following the procedure and to notify your physician if you are concerned that you have suffered a radiation induced injury.    All of the patient's questions were answered, patient is agreeable to proceed. Consent signed and in chart. Pt is aware he will be admitted overnight to Neuro ICU if intervention is performed. He is agreeable to plan  Thank you for this interesting consult.  I greatly enjoyed meeting ERASMO DIMARZIO and look forward to participating in their care.  A copy of this report was sent to the requesting provider on this date.  Electronically Signed: Lavonia Drafts, PA-C 11/24/2018, 6:57 AM   I spent a total of  30 Minutes   in face to face in clinical consultation, greater than 50% of which was counseling/coordinating care for cerebrala rteriogram with possible R MCA intervention

## 2018-11-24 NOTE — Progress Notes (Signed)
Documented in wrong column

## 2018-11-24 NOTE — Anesthesia Procedure Notes (Signed)
Arterial Line Insertion Start/End9/21/2020 9:30 AM, 11/24/2018 9:48 AM Performed by: Brennan Bailey, MD, anesthesiologist  Patient location: Pre-op. Preanesthetic checklist: patient identified, IV checked, site marked, risks and benefits discussed, surgical consent, monitors and equipment checked, pre-op evaluation, timeout performed and anesthesia consent Lidocaine 1% used for infiltration and patient sedated radial was placed Catheter size: 20 Fr Hand hygiene performed  and maximum sterile barriers used   Attempts: 4 Procedure performed without using ultrasound guided technique. Following insertion, dressing applied. Post procedure assessment: normal and unchanged  Patient tolerated the procedure well with no immediate complications. Additional procedure comments: Several attempts by CRNA on left radial unsuccessful. One attempt by myself on right prior to induction unsuccessful. Placed after induction on first subsequent attempt on right. Daiva Huge, MD.

## 2018-11-24 NOTE — Discharge Instructions (Signed)
Femoral Site Care This sheet gives you information about how to care for yourself after your procedure. Your health care provider may also give you more specific instructions. If you have problems or questions, contact your health care provider. What can I expect after the procedure? After the procedure, it is common to have:  Bruising that usually fades within 1-2 weeks.  Tenderness at the site. Follow these instructions at home: Wound care  Follow instructions from your health care provider about how to take care of your insertion site. Make sure you: ? Wash your hands with soap and water before you change your bandage (dressing). If soap and water are not available, use hand sanitizer. ? Change your dressing as told by your health care provider.  Do not take baths, swim, or use a hot tub until your health care provider approves.  You may shower 24-48 hours after the procedure. ? Gently wash the site with plain soap and water. ? Pat the area dry with a clean towel. ? Do not rub the site. This may cause bleeding.  Do not apply powder or lotion to the site. Keep the site clean and dry.  Check your femoral site every day for signs of infection. Check for: ? Redness, swelling, or pain. ? Fluid or blood. ? Warmth. ? Pus or a bad smell. Activity  For the first 2-3 days after your procedure, or as long as directed: ? Avoid climbing stairs as much as possible. ? Do not squat.  Do not lift anything that is heavier than 10 lb for 5 days.  Rest as directed. ? Avoid sitting for a long time without moving. Get up to take short walks every 1-2 hours.  Do not drive for 24 hours if you were given a medicine to help you relax (sedative). General instructions  Take over-the-counter and prescription medicines only as told by your health care provider.  Keep all follow-up visits as told by your health care provider. This is important. Contact a health care provider if you have:  A fever  or chills.  You have redness, swelling, or pain around your insertion site. Get help right away if:  The catheter insertion area swells very fast.  You pass out.  You suddenly start to sweat or your skin gets clammy.  The catheter insertion area is bleeding, and the bleeding does not stop when you hold steady pressure on the area.  The area near or just beyond the catheter insertion site becomes pale, cool, tingly, or numb. These symptoms may represent a serious problem that is an emergency. Do not wait to see if the symptoms will go away. Get medical help right away. Call your local emergency services (911 in the [place] Do not drive yourself to the hospital. Summary  After the procedure, it is common to have bruising that usually fades within 1-2 weeks.  Check your femoral site every day for signs of infection.  Do not lift anything that is heavier than 10 lb (4.5 kg) for 5 days.  This information is not intended to replace advice given to you by your health care provider. Make sure you discuss any questions you have with your health care provider. Document Released: 10/23/2013 Document Revised: 03/04/2017 Document Reviewed: 03/04/2017 Elsevier Patient Education  2020 [employer].

## 2018-11-25 ENCOUNTER — Encounter (HOSPITAL_COMMUNITY): Payer: Self-pay | Admitting: Interventional Radiology

## 2018-11-26 ENCOUNTER — Encounter (HOSPITAL_COMMUNITY): Payer: Self-pay | Admitting: Interventional Radiology

## 2018-11-27 ENCOUNTER — Encounter: Payer: Self-pay | Admitting: Family Medicine

## 2018-11-28 NOTE — Telephone Encounter (Signed)
Will check with providers before responding to the patient.

## 2018-12-02 ENCOUNTER — Ambulatory Visit (INDEPENDENT_AMBULATORY_CARE_PROVIDER_SITE_OTHER): Payer: No Typology Code available for payment source | Admitting: Internal Medicine

## 2018-12-02 ENCOUNTER — Other Ambulatory Visit: Payer: Self-pay

## 2018-12-02 ENCOUNTER — Encounter: Payer: Self-pay | Admitting: Internal Medicine

## 2018-12-02 VITALS — BP 130/72 | HR 88 | Temp 97.2°F | Ht 71.0 in | Wt 209.0 lb

## 2018-12-02 DIAGNOSIS — G459 Transient cerebral ischemic attack, unspecified: Secondary | ICD-10-CM | POA: Diagnosis not present

## 2018-12-02 DIAGNOSIS — Z8249 Family history of ischemic heart disease and other diseases of the circulatory system: Secondary | ICD-10-CM

## 2018-12-02 DIAGNOSIS — E7849 Other hyperlipidemia: Secondary | ICD-10-CM | POA: Diagnosis not present

## 2018-12-02 NOTE — Patient Instructions (Addendum)
Medication Instructions:  The current medical regimen is effective;  continue present plan and medications as directed. Please refer to the Current Medication list given to you today. If you need a refill on your cardiac medications before your next appointment, please call your pharmacy.  Labwork: LPA TODAY HERE IN OUR OFFICE AT [facility]   Take the provided lab slips with you to the lab for your blood draw.   When you have your labs (blood work) drawn today and your tests are completely normal, you will receive your results only by MyChart Message (if you have MyChart) -OR-  A paper copy in the mail.  If you have any lab test that is abnormal or we need to change your treatment, we will call you to review these results.  Follow-Up: You will need a follow up appointment in 3-4 months.  You may see DR Debara Pickett, MD or one of the following Advanced Practice Providers on your designated Care Team:  Almyra Deforest, [place] Fabian Sharp, PA-C     At [facility], you and your health needs are our priority.  As part of our continuing mission to provide you with exceptional heart care, we have created designated Provider Care Teams.  These Care Teams include your primary Cardiologist (physician) and Advanced Practice Providers (APPs -  Physician Assistants and Nurse Practitioners) who all work together to provide you with the care you need, when you need it.  Thank you for choosing [facility] at [facility]!!

## 2018-12-04 ENCOUNTER — Encounter: Payer: Self-pay | Admitting: Internal Medicine

## 2018-12-04 MED ORDER — PRALUENT 150 MG/ML ~~LOC~~ SOAJ: 1.0000 | SUBCUTANEOUS | 3 refills | Status: DC

## 2018-12-04 MED FILL — PRALUENT 150 MG/ML SOAJ: 150 | 28 days supply | Qty: 2 | Fill #0

## 2018-12-04 NOTE — Progress Notes (Signed)
LIPID CLINIC NOTE  Chief Complaint:  Follow-up dyslipidemia  Primary Care Physician: Zenia Resides, MD  Primary Cardiologist:  No primary care provider on file.  HPI:  Brad Holmes is a 48 y.o. male who was recently seen in a telemedicine visit for the evaluation and management of dyslipidemia.  He has a longstanding history of dyslipidemia probably dating back to his knowledge to his teenage years.  Unfortunately has a significant family history of heart disease in his father side including his father who died in his late 13s of a massive heart attack and his grandfather who is had bypass surgery (I believe) and numerous stents.  Brad Holmes first found out about his cholesterol interestingly when he was [place].  He was found to have a significantly elevated cholesterol at the time.  In the past he has been on atorvastatin which caused significant side effects however seems to be tolerating rosuvastatin.  He recently established care here and was married this past November.  His wife is a Designer, jewellery I believe in adolescent care with the [facility].  Lab work from June showed total cholesterol 361, triglycerides 303, HDL 40, LDL 260.  Of note he was not fasting during the study.  He did have some other labs done 9 years ago which showed a total cholesterol of 233 with triglycerides 36, HDL 35 and LDL 191.  At that time he had suffered a viral illness and had chest pain with an elevated troponin.  He underwent heart catheterization by Dr. Burt Knack which demonstrated no significant coronary disease.  He continues to report being asymptomatic however he has several children and recently being remarried is concerned about his cardiovascular risk and wishes to prevent events if possible.  12/02/2018  Brad Holmes for follow-up of dyslipidemia.  He is tolerating rosuvastatin 40 mg and has had a significant decline in LDL cholesterol.  His  total cholesterol 212, triglycerides 171, HDL 33 and LDL 145 (decreased from 260).  Unfortunately, he suffered a TIA/stroke.  He ultimately had to undergo a cerebral angiography and was then found to have a moderate stenosis of the MCA.  He was also given TPA.  There were actually 2 back-to-back episodes.  Fortunately he has had neurologic recovery.  This does underscore the significant increased risk that he faces.  PMHx:  Past Medical History:  Diagnosis Date  . Hypercholesterolemia   . Hypertension   . Stroke ([place])   . Transient ischemic attack     Past Surgical History:  Procedure Laterality Date  . CARDIAC CATHETERIZATION     05/12/09  . EYE SURGERY    . IR 3D INDEPENDENT WKST  11/24/2018  . IR ANGIO INTRA EXTRACRAN SEL COM CAROTID INNOMINATE BILAT MOD SED  11/14/2018  . IR ANGIO INTRA EXTRACRAN SEL INTERNAL CAROTID UNI R MOD SED  11/24/2018  . IR ANGIO VERTEBRAL SEL SUBCLAVIAN INNOMINATE UNI R MOD SED  11/24/2018  . IR ANGIO VERTEBRAL SEL VERTEBRAL BILAT MOD SED  11/14/2018  . IR NEURO EACH ADD'L AFTER BASIC UNI RIGHT (MS)  11/24/2018  . RADIOLOGY WITH ANESTHESIA N/A 11/24/2018   Procedure: RADIOLOGY WITH ANESTHESIA  STENTING;  Surgeon: Luanne Bras, MD;  Location: [facility];  Service: Radiology;  Laterality: N/A;  . WISDOM TOOTH EXTRACTION      FAMHx:  Family History  Problem Relation Age of Onset  . Hyperlipidemia Mother   . Heart disease Mother 38  .  Heart failure Mother 5  . Colon cancer Neg Hx   . Colon polyps Neg Hx   . Esophageal cancer Neg Hx   . Rectal cancer Neg Hx   . Stomach cancer Neg Hx     SOCHx:   reports that he has never smoked. He has never used smokeless tobacco. He reports current alcohol use. He reports that he does not use drugs.  ALLERGIES:  Allergies  Allergen Reactions  . No Known Allergies     ROS: Pertinent items noted in HPI and remainder of comprehensive ROS otherwise negative.  HOME MEDS: Current Outpatient Medications on File Prior  to Visit  Medication Sig Dispense Refill  . aspirin 325 MG EC tablet Take 1 tablet (325 mg total) by mouth daily. 30 tablet 0  . clopidogrel (PLAVIX) 75 MG tablet Take 1 tablet (75 mg total) by mouth daily. 30 tablet 0  . rosuvastatin (CRESTOR) 40 MG tablet Take 1 tablet (40 mg total) by mouth daily. 90 tablet 3   No current facility-administered medications on file prior to visit.     LABS/IMAGING: No results found for this or any previous visit (from the past 48 hour(s)). No results found.  LIPID PANEL:    Component Value Date/Time   CHOL 212 (H) 11/14/2018 1758   CHOL 361 (H) 08/11/2018 0942   TRIG 171 (H) 11/14/2018 1758   HDL 33 (L) 11/14/2018 1758   HDL 40 08/11/2018 0942   CHOLHDL 6.4 11/14/2018 1758   VLDL 34 11/14/2018 1758   LDLCALC 145 (H) 11/14/2018 1758   LDLCALC 260 (H) 08/11/2018 0942    WEIGHTS: Wt Readings from Last 3 Encounters:  12/02/18 209 lb (94.8 kg)  11/24/18 211 lb 13.8 oz (96.1 kg)  11/14/18 211 lb 13.8 oz (96.1 kg)    VITALS: BP 130/72 (BP Location: Left Arm, Patient Position: Sitting, Cuff Size: Normal)   Pulse 88   Temp (!) 97.2 F (36.2 C)   Ht 5\' 11"  (1.803 m)   Wt 209 lb (94.8 kg)   SpO2 97%   BMI 29.15 kg/m   EXAM: General appearance: alert and no distress Neck: no carotid bruit, no JVD and thyroid not enlarged, symmetric, no tenderness/mass/nodules Lungs: clear to auscultation bilaterally Heart: regular rate and rhythm, S1, S2 normal, no murmur, click, rub or gallop Abdomen: soft, non-tender; bowel sounds normal; no masses,  no organomegaly Extremities: extremities normal, atraumatic, no cyanosis or edema Pulses: 2+ and symmetric Skin: Skin color, texture, turgor normal. No rashes or lesions Neurologic: Grossly normal Psych: Pleasant  EKG: Deferred  ASSESSMENT: 1. Familial hyperlipidemia 2. Recent TIA/stroke x2 3. Strong family history of premature coronary disease  PLAN: 1.   Brad Holmes had unfortunately suffered 2  recent TIA/stroke episodes and does likely have familial hyperlipidemia.  He has had a good response to rosuvastatin with close to 60% reduction in his LDL cholesterol.  He remains well above goal LDL less than 70.  He would benefit from the addition of PCSK9 inhibitor to try to reach a target LDL less than 70.  Would recommend Praluent 150 mg every 2 weeks.  We will go ahead and try to obtain prior authorization from his insurance provider.  Repeat lipids in 3 to 4 months and follow-up with me at that time.  Pixie Casino, MD, [facility], [facility] Director of the Advanced Lipid Disorders &  Cardiovascular Risk Reduction Clinic Diplomate of the American Board of Clinical Lipidology  Attending Cardiologist  Direct Dial: [phone]  Fax: [phone]  Website:  [email or web address] 12/04/2018, 11:07 PM

## 2018-12-15 ENCOUNTER — Encounter: Payer: Self-pay | Admitting: Adult Health

## 2018-12-16 ENCOUNTER — Inpatient Hospital Stay: Payer: No Typology Code available for payment source | Admitting: Adult Health

## 2018-12-16 ENCOUNTER — Telehealth: Payer: Self-pay

## 2018-12-16 NOTE — Telephone Encounter (Signed)
Unable to get in contact with the patient. LVM explaining that Brad Holmes will be out of office today and that Brad Holmes appt has been r/s for 12-23-2018 @ 8:15 am. Office number provided in case this time doesn't work for him or if he has any questions. MyChart message has been sent as well.

## 2018-12-18 MED FILL — PRALUENT 150 MG/ML SOAJ: 150 | 28 days supply | Qty: 2 | Fill #0

## 2018-12-18 MED FILL — CLOPIDOGREL 75 MG TABLET: 75 | 30 days supply | Qty: 30 | Fill #0

## 2018-12-23 ENCOUNTER — Ambulatory Visit: Payer: No Typology Code available for payment source | Admitting: Adult Health

## 2018-12-23 ENCOUNTER — Other Ambulatory Visit: Payer: Self-pay

## 2018-12-23 ENCOUNTER — Encounter: Payer: Self-pay | Admitting: Adult Health

## 2018-12-23 VITALS — BP 132/84 | HR 82 | Temp 97.2°F | Ht 71.0 in | Wt 209.2 lb

## 2018-12-23 DIAGNOSIS — E7800 Pure hypercholesterolemia, unspecified: Secondary | ICD-10-CM | POA: Diagnosis not present

## 2018-12-23 DIAGNOSIS — I63511 Cerebral infarction due to unspecified occlusion or stenosis of right middle cerebral artery: Secondary | ICD-10-CM

## 2018-12-23 DIAGNOSIS — I69393 Ataxia following cerebral infarction: Secondary | ICD-10-CM

## 2018-12-23 DIAGNOSIS — I10 Essential (primary) hypertension: Secondary | ICD-10-CM | POA: Diagnosis not present

## 2018-12-23 DIAGNOSIS — E7849 Other hyperlipidemia: Secondary | ICD-10-CM

## 2018-12-23 DIAGNOSIS — G459 Transient cerebral ischemic attack, unspecified: Secondary | ICD-10-CM

## 2018-12-23 DIAGNOSIS — Z8249 Family history of ischemic heart disease and other diseases of the circulatory system: Secondary | ICD-10-CM

## 2018-12-23 NOTE — Progress Notes (Signed)
[facility] [address] [place]. [place] [address] [phone]       HOSPITAL FOLLOW UP NOTE  Mr. Brad Holmes Date of Birth:  March 28, 1970 Medical Record Number:  [id number]   Reason for Referral:  hospital stroke follow up    CHIEF COMPLAINT:  Chief Complaint  Patient presents with   Hospitalization Follow-up    Alone. Treatment room. Patient mentioned that he would like to discuss plavix and aspirin. He would like to discuss his possible CT scans    HPI: Brad Holmes being seen today for in office hospital follow-up regarding right CR small infarct due to severe right M1 stenosis and right ICA siphon stenosis on 11/13/2018.  History obtained from patient and chart review. Reviewed all radiology images and labs personally.  Brad Holmes is a 48 y.o. male with history of migraines, HTN, HLD, vitreal hemorrhage s/p OR  who presented on 11/13/2018 with left arm > leg weakness, dysarthria and left facial droop.   Stroke work-up revealed right CR small infarct as evidenced on MRI due to severe right M1 stenosis as well as right ICA siphon stenosis as evidenced on CTA head.  Due to improvement of his symptoms, TPA not indicated.  CTA neck unremarkable.  Underwent cerebral angiogram which showed right M1 85 to 90% stenosis and right supraclinoid 65 to 70% stenosis.  Recommended repeating angiogram in 2 to 3 weeks to reevaluate right M1 stenosis per Dr. Estanislado Pandy with possible consideration of right M1 stenting when appropriate.  2D echo EF of 55 to 60%.  LDL 260.  A1c 5.5.  Recommended DAPT for 3 months then Plavix alone due to intercranial stenosis and previously on aspirin.  HTN stable recommend long-term BP goal 1 30-1 50 due to intracranial stenosis.  History of HLD and familial hypercholesterolemia and recommended initiating/consideration of PCSK9 inhibitors outpatient but continue Crestor 40 mg daily at discharge.  No prior history or evidence of DM.  Other stroke risk  factors include obesity, history of migraines and family history of MI.  Brad Holmes is being seen today for hospital follow-up.  Residual deficits of intermittent decreased left hand dexterity, short-term memory complaints, fatigue, headache and occasional action tremors.  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  He does endorse improvement compared to hospital discharge but does have waxing/waning of symptoms.  He does endorse increased anxiety with fears of reoccurring stroke but this is also been improving.  He states his wife noticed shaking of his right hand the other night while using a knife and will have occasional tremoring of left hand.  He states he barely notices tremors/shaking and symptoms do not interfere with daily activity or functioning.  He did have generalized headache at hospital discharge which subsided after the first week but has been experiencing right temporal headaches over the past 2 weeks with approximately 4 over a 2 week period.  Headaches are mild and respond to Tylenol.  Headaches are not debilitating and denies changes in vision or any other neurological symptoms.  Headaches typically present at the end of the workday or after looking at a computer screen for too long.  He has continued on aspirin and Plavix without bleeding or bruising.  Continues on Crestor with recent initiation of Praluent by cardiology.  He plans on picking up prescriptions today for first administration. He underwent repeat cerebral angiogram with Dr. 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  Recommended continuation of current treatment plan with DAPT and statin and repeat CTA for follow-up. Blood pressure at 132/84 today which has been similar to home readings.   Antihypertensives discontinued at hospitalization and have not been restarted at this time.  He is questioning ongoing duration of DAPT, repeat imaging with vascular surgery and need of restarting antihypertensives.  No further concerns at this time.    ROS:   14 system review of systems performed and negative with exception of anxiety, memory loss, headache, weakness and tremors  PMH:  Past Medical History:  Diagnosis Date   Hypercholesterolemia    Hypertension    Stroke ([place])    Transient ischemic attack     PSH:  Past Surgical History:  Procedure Laterality Date   CARDIAC CATHETERIZATION     05/12/09   EYE SURGERY     IR 3D INDEPENDENT WKST  11/24/2018   IR ANGIO INTRA EXTRACRAN SEL COM CAROTID INNOMINATE BILAT MOD SED  11/14/2018   IR ANGIO INTRA EXTRACRAN SEL INTERNAL CAROTID UNI R MOD SED  11/24/2018   IR ANGIO VERTEBRAL SEL SUBCLAVIAN INNOMINATE UNI R MOD SED  11/24/2018   IR ANGIO VERTEBRAL SEL VERTEBRAL BILAT MOD SED  11/14/2018   IR NEURO EACH ADD'L AFTER BASIC UNI RIGHT (MS)  11/24/2018   RADIOLOGY WITH ANESTHESIA N/A 11/24/2018   Procedure: RADIOLOGY WITH ANESTHESIA  STENTING;  Surgeon: Luanne Bras, MD;  Location: [facility];  Service: Radiology;  Laterality: N/A;   WISDOM TOOTH EXTRACTION      Social History:  Social History   Socioeconomic History   Marital status: Married    Spouse name: Not on file   Number of children: Not on file   Years of education: Not on file   Highest education level: Not on file  Occupational History   Not on file  Social Needs   Financial resource strain: Not on file   Food insecurity    Worry: Not on file    Inability: Not on file   Transportation needs    Medical: Not on file    Non-medical: Not on file  Tobacco Use   Smoking status: Never Smoker   Smokeless tobacco: Never Used  Substance and Sexual Activity   Alcohol use: Yes    Comment: rare   Drug use: No   Sexual activity: Not on file    Lifestyle   Physical activity    Days per week: Not on file    Minutes per session: Not on file   Stress: Not on file  Relationships   Social connections    Talks on phone: Not on file    Gets together: Not on file    Attends religious service: Not on file    Active member of club or organization: Not on file    Attends meetings of clubs or organizations: Not on file    Relationship status: Not on file   Intimate partner violence    Fear of current or ex partner: Not on file    Emotionally abused: Not on file    Physically abused: Not on file    Forced sexual activity: Not on file  Other Topics Concern   Not on file  Social History Narrative   Not on file    Family History:  Family History  Problem Relation Age  of Onset   Hyperlipidemia Mother    Heart disease Mother 55   Heart failure Mother 76   Colon cancer Neg Hx    Colon polyps Neg Hx    Esophageal cancer Neg Hx    Rectal cancer Neg Hx    Stomach cancer Neg Hx     Medications:   Current Outpatient Medications on File Prior to Visit  Medication Sig Dispense Refill   Alirocumab (PRALUENT) 150 MG/ML SOAJ Inject 1 Dose into the skin every 14 (fourteen) days. 6 pen 3   aspirin 325 MG EC tablet Take 1 tablet (325 mg total) by mouth daily. 30 tablet 0   clopidogrel (PLAVIX) 75 MG tablet Take 1 tablet (75 mg total) by mouth daily. 30 tablet 0   rosuvastatin (CRESTOR) 40 MG tablet Take 1 tablet (40 mg total) by mouth daily. 90 tablet 3   No current facility-administered medications on file prior to visit.     Allergies:   Allergies  Allergen Reactions   No Known Allergies      Physical Exam  Vitals:   12/23/18 0807  BP: 132/84  Pulse: 82  Temp: (!) 97.2 F (36.2 C)  TempSrc: Oral  Weight: 209 lb 3.2 oz (94.9 kg)  Height: 5\' 11"  (1.803 m)   Body mass index is 29.18 kg/m. No exam data present  General: well developed, well nourished,  pleasant middle-age Caucasian male, seated, in  no evident distress Head: head normocephalic and atraumatic.   Neck: supple with no carotid or supraclavicular bruits Cardiovascular: regular rate and rhythm, no murmurs Musculoskeletal: no deformity Skin:  no rash/petichiae Vascular:  Normal pulses all extremities   Neurologic Exam Mental Status: Awake and fully alert. Oriented to place and time. Recent and remote memory intact. Attention span, concentration and fund of knowledge appropriate. Mood and affect appropriate.  Cranial Nerves: Fundoscopic exam reveals sharp disc margins. Pupils equal, briskly reactive to light. Extraocular movements full without nystagmus. Visual fields full to confrontation. Hearing intact. Facial sensation intact. Face, tongue, palate moves normally and symmetrically.  Motor: Normal bulk and tone. Normal strength in all tested extremity muscles. Sensory.: intact to touch , pinprick , position and vibratory sensation.  Coordination: Rapid alternating movements normal in all extremities. Finger-to-nose and heel-to-shin positive for ataxia left upper and lower extremity with normal movements on right side. Gait and Station: Arises from chair without difficulty. Stance is normal. Gait demonstrates normal stride length and balance Reflexes: 1+ and symmetric. Toes downgoing.     NIHSS  2 Modified Rankin  2    Diagnostic Data (Labs, Imaging, Testing)  CT HEAD WO CONTRAST 11/14/2018 IMPRESSION: 1. No intracranial hemorrhage. 2. ASPECTS is 10.  CT ANGIO HEAD W OR WO CONTRAST CT ANGIO NECK W OR WO CONTRAST CT CEREBRAL PERFUSION W CONTRAST 11/14/2018 IMPRESSION: 1. No core infarct or penumbra. 2. Severe stenosis of the distal M1 segment of the right MCA with widely patent distal branches. 3. Mild-to-moderate narrowing of the right carotid terminus. 4. Normal CTA of the neck.   MR BRAIN WO CONTRAST 11/14/2018 IMPRESSION: Normal MRI brain. ADDENDUM: After further review and discussion with Dr. Erlinda Hong, there  is a small hyperintensity in the right corona radiata on diffusion weighted imaging. This measures approximately 4 x 6 mm and may represent acute or subacute infarction.  ECHOCARDIOGRAM 11/14/2018 IMPRESSIONS  1. The left ventricle has normal systolic function, with an ejection fraction of 55-60%. The cavity size was normal. Left ventricular diastolic parameters were normal. No  evidence of left ventricular regional wall motion abnormalities.  2. The right ventricle has normal systolic function. The cavity was normal. There is no increase in right ventricular wall thickness. Right ventricular systolic pressure could not be assessed.  3. The aorta is normal unless otherwise noted.  CEREBRAL ANGIOGRAM 11/14/2018 IMPRESSION: High-grade approximately 80-90% stenosis of the middle cerebral artery in the distal M1 segment. Approximately 65-70% stenosis of the right internal carotid artery supraclinoid segments. Mild-to-moderate intracranial arteriosclerotic disease of the left posteroinferior cerebellar artery proximally, and a mild degree involving the inferior division of the left middle cerebral artery proximally. Poor filling of the right anterior cerebral artery A1 segment.  CEREBRAL ANGIOGRAM 11/24/2018 IMPRESSION: Approximately 50-60% stenosis of the right internal carotid artery supraclinoid segment. Approximately 50% stenosis of the origin of the inferior division of the right middle cerebral artery. 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   ASSESSMENT: Brad Holmes is a 48 y.o. year old male presented with HA, left arm> leg weakness, dysarthria and left facial droop on 11/14/2018 with stroke work-up revealing right CR small infarct secondary to severe right M1 stenosis as well as right ICA siphon stenosis. Vascular risk factors include HTN, HLD, migraines.     PLAN:  1. Right CR stroke: Continue aspirin 81 mg daily and clopidogrel 75 mg daily  and Crestor and  Praluent for secondary stroke prevention. Maintain strict control of hypertension with blood pressure goal 130-150, diabetes with hemoglobin A1c goal below 6.5% and cholesterol with LDL cholesterol (bad cholesterol) goal below 70 mg/dL.  I also advised the patient to eat a healthy diet with plenty of whole grains, cereals, fruits and vegetables, exercise regularly with at least 30 minutes of continuous activity daily and maintain ideal body weight. 2. HTN: Blood pressure goal 130-150.  Today's BP stable at 132/84.  Discussion with patient regarding no indication to restart antihypertensives but to continue to monitor at home and ongoing follow-up with PCP/cardiology for future monitoring and management 3. HLD: Advised to continue current treatment regimen along with continued follow-up with PCP for future prescribing and monitoring of lipid panel 4. Intracranial stenosis: Continue DAPT and statin and follow-up with Dr. Estanislado Pandy.  We will reach out to vascular surgery in regards to timeframe of repeat imaging and ongoing duration of DAPT 5. Fatigue/anxiety/memory loss: Long discussion with patient regarding common symptoms post stroke.  Discussed use of antianxiety medications but patient declines at this time.  Provided patient with information for stress relaxation techniques, and memory exercises 6. Left-sided ataxia: Mild residual symptoms.  Provided patient with fine motor dexterity exercises to do at home.  Advised patient to notify office if he would like to participate in outpatient therapies in the future if needed    Follow up in 3 months or call earlier if needed   Greater than 50% of time during this 45 minute visit was spent on counseling, explanation of diagnosis of right CR stroke, reviewing risk factor management of HTN, HLD, intracranial stenosis, discussion regarding residual deficits and ongoing symptoms, planning of further management along with potential future management, and  discussion with patient and family answering all questions.    Frann Rider, AGNP-BC  [facility] [address] [place], [place] [address]  Phone [phone] Fax [phone] Note: This document was prepared with digital dictation and possible smart phrase technology. Any transcriptional errors that result from this process are unintentional.

## 2018-12-23 NOTE — Progress Notes (Signed)
I agree with the above plan 

## 2018-12-23 NOTE — Patient Instructions (Addendum)
Continue aspirin 81 mg daily and clopidogrel 75 mg daily  and Praluent and Crestor for secondary stroke prevention  I will reach out to vascular surgery in regards to repeat imaging and ongoing use of aspirin and Plavix -myself or vascular surgery will contact you with updates  Continue to follow up with PCP/cardiology regarding cholesterol and blood pressure management   No indication at this time to restart any of your blood pressure medications as we would like to maintain your systolic blood pressure at 130-150 to ensure adequate perfusion to your brain.  It is recommended for you to continue to monitor at home with ongoing follow-up by PCP for potential need of restarting medications in the future  Fatigue, short-term memory and increased anxiety after stroke are all common.  As time goes on, the symptoms should improve but you may need to consider initiating management for increased anxiety in the future.  Additional information provided  Exercises provided regarding fine motor dexterity exercises as you to have residual decreased left hand coordination with ataxia.  If interested in participating in therapies in the future, please let me know I will be more than happy to place orders  Please continue to monitor your headaches at this time.  If headaches worsen with increased severity or frequency, we may need to repeat imaging  Continue to monitor blood pressure at home  Maintain strict control of hypertension with blood pressure goal below 130/90, diabetes with hemoglobin A1c goal below 6.5% and cholesterol with LDL cholesterol (bad cholesterol) goal below 70 mg/dL. I also advised the patient to eat a healthy diet with plenty of whole grains, cereals, fruits and vegetables, exercise regularly and maintain ideal body weight.  Followup in the future with me in 3 months or call earlier if needed       Thank you for coming to see [place] at [facility]. I hope we have been  able to provide you high quality care today.  You may receive a patient satisfaction survey over the next few weeks. We would appreciate your feedback and comments so that we may continue to improve ourselves and the health of our patients.     Tips for reducing and managing fatigue  Although there isn't a clearly defined treatment for post-stroke fatigue, there are some practical steps that you can try to reduce your fatigue. This can help once you have ruled out other causes such as the side effects of medication, or diabetes.  Give yourself plenty of time. It can take many months before post-stroke fatigue starts to lift. Accepting that it takes time to improve can help you to cope better.  Keep a written or visual diary of how much you are doing each day. Over time, this really helps to remind you of the progress you've made and will help you understand how much activity you can cope with, and what triggers your fatigue. Don't push yourself to do too much if you're having a 'better day'. Although it is tempting, it may leave you exhausted for the next day or two.  Celebrate your successes. Many people feel frustrated by what they can't do and forget to feel good about what they have started to do again.  Learn to pace yourself by taking proper breaks before or after doing things. Even gentle activities like talking with friends, a car journey and eating a meal can be tiring.  Rest and sleep: you might need to rest or nap during the day. But if  you are having trouble sleeping at night, avoid sleeping during the day. Look for other ways to sleep better such as comfortable bedding and cotton sheets.  Don't make it hard for yourself by trying to do all the things you used to do, or at the same speed. It can be helpful to lower your expectations of what you can achieve for a while, so you can build up stamina and strength again slowly.  Find out how much you can do in a day and stick to it. For  example, if you can achieve about four hours of activity a day (with rests in between) without being too tired then that is the right level for you. If you do too much, you will probably soon realise, as you will need to rest more or have to spend a day in bed to recover.  Build up stamina and strength slowly or you may well feel you are going backwards if your fatigue worsens. Increase your activity gradually. Read our ideas and tips about moving more after a stroke.  Start to wind down during the evening and get into a bedtime routine.  Try to do some exercise, as this may help to improve fatigue. Start gently, for example, a very short walk or a few minutes on an exercise bike, and slowly build up without overdoing it. Ask a physiotherapist for help with this.  Eat healthily. Carbohydrates such as bread and pasta are good sources of energy, and try to eat at least five portions of fruit and vegetables each day. If you have trouble swallowing or eating after a stroke, you will need support from a dietician to help you eat the right types of food. Ask your GP to refer you for help.  Seek support. Your GP or occupational therapist can help put you in touch with different types of support, for example, stroke clubs, counselling, relaxation programmes, exercise groups or alternative therapies.  Many people find that peer support can help. Try our free online stroke community, My Stroke Guide, where you can hear from others about their stroke recovery.

## 2018-12-24 ENCOUNTER — Other Ambulatory Visit (HOSPITAL_COMMUNITY): Payer: Self-pay | Admitting: Interventional Radiology

## 2018-12-24 ENCOUNTER — Encounter: Payer: Self-pay | Admitting: Adult Health

## 2018-12-24 DIAGNOSIS — I63511 Cerebral infarction due to unspecified occlusion or stenosis of right middle cerebral artery: Secondary | ICD-10-CM

## 2018-12-25 NOTE — Progress Notes (Signed)
That sounds perfect and I'm sure the patient would greatly appreciate this.  Thank you for your assistance!

## 2018-12-26 LAB — LIPOPROTEIN A (LPA): Lipoprotein (a): 65.8 nmol/L (ref <75.0)

## 2018-12-29 ENCOUNTER — Telehealth (HOSPITAL_COMMUNITY): Payer: Self-pay

## 2018-12-29 NOTE — Telephone Encounter (Signed)
-----   Message from Ronney Lion, [place] sent at 12/24/2018  8:49 AM EDT ----- [name],  This patient underwent an image-guided diagnostic cerebral arteriogram 11/14/2018 and 11/24/2018 by Dr. Estanislado Pandy. He is also seen by Janett Billow, NP (stroke/neurology). She sent me a message stating patient had questions of procedure findings. Dr. Estanislado Pandy would like to schedule patient for a consult to discuss results, first available. Please schedule and please let me know if you have any questions.  Thanks! Radonna Ricker

## 2019-01-07 ENCOUNTER — Ambulatory Visit (HOSPITAL_COMMUNITY)
Admission: RE | Admit: 2019-01-07 | Discharge: 2019-01-07 | Disposition: A | Discharge status: Home / Self Care | Payer: No Typology Code available for payment source | Source: Ambulatory Visit | Attending: Interventional Radiology | Admitting: Interventional Radiology

## 2019-01-07 ENCOUNTER — Other Ambulatory Visit (HOSPITAL_COMMUNITY): Payer: Self-pay | Admitting: Interventional Radiology

## 2019-01-07 ENCOUNTER — Other Ambulatory Visit: Payer: Self-pay

## 2019-01-07 DIAGNOSIS — Z7902 Long term (current) use of antithrombotics/antiplatelets: Secondary | ICD-10-CM | POA: Insufficient documentation

## 2019-01-07 DIAGNOSIS — Z7982 Long term (current) use of aspirin: Secondary | ICD-10-CM | POA: Diagnosis not present

## 2019-01-07 DIAGNOSIS — I10 Essential (primary) hypertension: Secondary | ICD-10-CM | POA: Diagnosis not present

## 2019-01-07 DIAGNOSIS — Z8673 Personal history of transient ischemic attack (TIA), and cerebral infarction without residual deficits: Secondary | ICD-10-CM | POA: Insufficient documentation

## 2019-01-07 DIAGNOSIS — R413 Other amnesia: Secondary | ICD-10-CM

## 2019-01-07 DIAGNOSIS — E7800 Pure hypercholesterolemia, unspecified: Secondary | ICD-10-CM | POA: Diagnosis not present

## 2019-01-07 DIAGNOSIS — I63511 Cerebral infarction due to unspecified occlusion or stenosis of right middle cerebral artery: Secondary | ICD-10-CM | POA: Diagnosis present

## 2019-01-07 DIAGNOSIS — Z79899 Other long term (current) drug therapy: Secondary | ICD-10-CM | POA: Insufficient documentation

## 2019-01-07 LAB — PLATELET INHIBITION P2Y12: Platelet Function  P2Y12: 71 [PRU] — ABNORMAL LOW (ref 182–335)

## 2019-01-07 NOTE — Progress Notes (Signed)
Chief Complaint: Patient was seen in consultation today for right ICA supraclinoid stenosis and right MCA stenosis.  Referring Physician(s): Rosalin Hawking  Supervising Physician: Luanne Bras  Patient Status: [facility] - Out-pt  History of Present Illness: Brad Holmes is a 48 y.o. male with a past medical history as below, with pertinent past medical history including hypertension, hypercholesterolemia, TIA 11/2018, and CVA 11/2018 (small hyperintensity in the right corona radiata). He is known to [facility] and has been followed by Dr. Estanislado Pandy since 11/2018. He first presented to our department at the request of Dr. Erlinda Hong while admitted to [facility] (11/14/2018 to 11/15/2018) for management of CVA thought to be secondary to right MCA stenosis seen on CTA 11/14/2018. He underwent an image-guided diagnostic cerebral arteriogram during this admission 11/14/2018 by Dr. Estanislado Pandy, which confirmed patient's right ICA supraclinoid stenosis and high-grade right MCA stenosis. He was planned for endovascular revascularization of right MCA stenosis on an outpatient basis, and underwent another image-guided diagnostic cerebral arteriogram 11/24/2018 by Dr. Estanislado Pandy. These results demonstrated that patient's areas of intracranial stenosis were stable on medical management, and it was recommended that patient continue conservative management at that time. Procedure was aborted, and patient was discharged home 11/24/2018 in stable condition.  CTA head/neck 11/14/2018: 1. No core infarct or penumbra. 2. Severe stenosis of the distal M1 segment of the right MCA with widely patent distal branches. 3. Mild-to-moderate narrowing of the right carotid terminus. 4. Normal CTA of the neck.  Diagnostic cerebral arteriogram 11/14/2018: 1. High-grade approximately 80-90% stenosis of the middle cerebral artery in the distal M1 segment. 2. Approximately 65-70% stenosis of the right internal carotid artery supraclinoid segments. 3.  Mild-to-moderate intracranial arteriosclerotic disease of the left posteroinferior cerebellar artery proximally, and a mild degree involving the inferior division of the left middle cerebral artery proximally. 4. Poor filling of the right anterior cerebral artery A1 segment.  Diagnostic cerebral arteriogram 11/24/2018: 1. Approximately 50-60% stenosis of the right internal carotid artery supraclinoid segment. 2. Approximately 50% stenosis of the origin of the inferior division of the right middle cerebral artery. 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  Patient presents today for follow-up to discuss findings of recent diagnostic cerebral arteriogram. Patient awake and alert sitting in chair. Complains of memory loss since CVA. States that his neurologist told him he had decreased fine motor movements of LUE. Denies headache, weakness, numbness/tingling, dizziness, vision changes, hearing changes, tinnitus, or speech difficulty.  Currently taking Plavix 75 mg once daily and Aspirin 325 mg once daily.   Past Medical History:  Diagnosis Date  . Hypercholesterolemia   . Hypertension   . Stroke ([place])   . Transient ischemic attack     Past Surgical History:  Procedure Laterality Date  . CARDIAC CATHETERIZATION     05/12/09  . EYE SURGERY    . IR 3D INDEPENDENT WKST  11/24/2018  . IR ANGIO INTRA EXTRACRAN SEL COM CAROTID INNOMINATE BILAT MOD SED  11/14/2018  . IR ANGIO INTRA EXTRACRAN SEL INTERNAL CAROTID UNI R MOD SED  11/24/2018  . IR ANGIO VERTEBRAL SEL SUBCLAVIAN INNOMINATE UNI R MOD SED  11/24/2018  . IR ANGIO VERTEBRAL SEL VERTEBRAL BILAT MOD SED  11/14/2018  . IR NEURO EACH ADD'L AFTER BASIC UNI RIGHT (MS)  11/24/2018  . RADIOLOGY WITH ANESTHESIA N/A 11/24/2018   Procedure: RADIOLOGY WITH ANESTHESIA  STENTING;  Surgeon: Luanne Bras, MD;  Location: [place];  Service: Radiology;  Laterality: N/A;  . WISDOM TOOTH EXTRACTION  Allergies: No known allergies   Medications: Prior to Admission medications   Medication Sig Start Date End Date Taking? Authorizing Provider  Alirocumab (PRALUENT) 150 MG/ML SOAJ Inject 1 Dose into the skin every 14 (fourteen) days. 12/04/18   Pixie Casino, MD  aspirin 325 MG EC tablet Take 1 tablet (325 mg total) by mouth daily. 11/15/18   Mullis, Kiersten P, DO  clopidogrel (PLAVIX) 75 MG tablet Take 1 tablet (75 mg total) by mouth daily. 11/16/18   Mullis, Kiersten P, DO  rosuvastatin (CRESTOR) 40 MG tablet Take 1 tablet (40 mg total) by mouth daily. 08/12/18   Zenia Resides, MD     Family History  Problem Relation Age of Onset  . Hyperlipidemia Mother   . Heart disease Mother 32  . Heart failure Mother 76  . Colon cancer Neg Hx   . Colon polyps Neg Hx   . Esophageal cancer Neg Hx   . Rectal cancer Neg Hx   . Stomach cancer Neg Hx     Social History   Socioeconomic History  . Marital status: Married    Spouse name: Not on file  . Number of children: Not on file  . Years of education: Not on file  . Highest education level: Not on file  Occupational History  . Not on file  Social Needs  . Financial resource strain: Not on file  . Food insecurity    Worry: Not on file    Inability: Not on file  . Transportation needs    Medical: Not on file    Non-medical: Not on file  Tobacco Use  . Smoking status: Never Smoker  . Smokeless tobacco: Never Used  Substance and Sexual Activity  . Alcohol use: Yes    Comment: rare  . Drug use: No  . Sexual activity: Not on file  Lifestyle  . Physical activity    Days per week: Not on file    Minutes per session: Not on file  . Stress: Not on file  Relationships  . Social Herbalist on phone: Not on file    Gets together: Not on file    Attends religious service: Not on file    Active member of club or organization: Not on file    Attends meetings of clubs or organizations: Not on file    Relationship status: Not on file  Other Topics Concern   . Not on file  Social History Narrative  . Not on file     Review of Systems: A 12 point ROS discussed and pertinent positives are indicated in the HPI above.  All other systems are negative.  Review of Systems  Constitutional: Negative for chills and fever.  HENT: Negative for hearing loss and tinnitus.   Eyes: Negative for visual disturbance.  Respiratory: Negative for shortness of breath and wheezing.   Cardiovascular: Negative for chest pain and palpitations.  Neurological: Negative for dizziness, speech difficulty, weakness, numbness and headaches.  Psychiatric/Behavioral: Negative for behavioral problems and confusion.    Physical Exam Constitutional:      General: He is not in acute distress.    Appearance: Normal appearance.  Pulmonary:     Effort: Pulmonary effort is normal. No respiratory distress.  Skin:    General: Skin is warm and dry.  Neurological:     Mental Status: He is alert and oriented to person, place, and time.  Psychiatric:        Mood and Affect:  Mood normal.        Behavior: Behavior normal.        Thought Content: Thought content normal.        Judgment: Judgment normal.      Imaging: No results found.  Labs:  CBC: Recent Labs    08/11/18 0942 11/14/18 0248 11/14/18 0330 11/15/18 0335 11/24/18 0626  WBC 5.9 6.4  --  7.6 6.4  HGB 15.5 13.7 13.3 14.6 15.1  HCT 44.7 40.5 39.0 43.3 43.7  PLT 286 244  --  256 256    COAGS: Recent Labs    11/14/18 0248 11/24/18 0626  INR 0.9 1.0  APTT 28  --     BMP: Recent Labs    08/11/18 0942 11/14/18 0248 11/14/18 0330 11/15/18 0335 11/24/18 0626  NA 138 136 138 139 140  K 4.5 3.9 3.8 4.1 4.7  CL 101 106 104 108 104  CO2 25 22  --  24 26  GLUCOSE 86 107* 98 86 101*  BUN 10 12 13 7 16   CALCIUM 10.2 8.8*  --  9.1 9.3  CREATININE 0.92 0.97 0.90 0.89 0.93  GFRNONAA 97 >60  --  >60 >60  GFRAA 112 >60  --  >60 >60    LIVER FUNCTION TESTS: Recent Labs    02/10/18 1116 08/11/18  0942 11/14/18 0248  BILITOT 0.5 0.4 0.5  AST 21 36 21  ALT 35 89* 35  ALKPHOS  --  91 63  PROT 6.9 6.8 5.8*  ALBUMIN  --  4.5 3.4*     Assessment and Plan:  Right ICA supraclinoid stenosis. Right MCA stenosis. Dr. Estanislado Pandy was present for consultation. Discussed findings of recent diagnostic cerebral arteriogram. Explained that his findings were stable/improved on procedure findings from 11/24/2018 when compared to procedure findings from 11/14/2018. Explained that this indicates that patient is stable on conservative (medical) management, and due to this along with patient being grossly asymptomatic at this time, recommend continued conservative management at this time, including continued DAPT and routine imaging scans to monitor for changes 3 months from recent procedure.  Discussed symptoms of memory loss/decreased fine motor movements of LUE. Recommend MRI brain for further assessment.  Discussed patient's hypercholesterolemia and hypertension. Patient states that his Bps typically run in the [id number] systolic. Recommend patient follow-up with PCP/cardiology regarding management.  Plan for follow-up with MRI head (without contrast) ASAP AND CTA head/neck (with contrast) 3 months from most recent procedure 11/24/2018. Informed patient that our schedulers will call him to set up these imaging scans. Instructed patient to call us if he notices changes to his neurological symptoms (might warrant moving up his CTA). Instructed patient to continue taking Plavix 75 mg once daily and Aspirin 325 mg once daily. P2Y12 ordered today, 71 PRU- per Dr. Estanislado Pandy continue medications as above. Instructed patient to follow-up with his neurologist regularly.  All questions answered and concerns addressed. Patient conveys understanding and agrees with plan.  Thank you for this interesting consult.  I greatly enjoyed meeting Brad Holmes and look forward to participating in their care.  A copy of this  report was sent to the requesting provider on this date.  Electronically Signed: Earley Abide, PA-C 01/07/2019, 10:36 AM   I spent a total of 40 Minutes in face to face in clinical consultation, greater than 50% of which was counseling/coordinating care for right ICA supraclinoid stenosis and right MCA stenosis.

## 2019-01-09 ENCOUNTER — Other Ambulatory Visit: Payer: Self-pay

## 2019-01-09 ENCOUNTER — Ambulatory Visit (HOSPITAL_COMMUNITY)
Admission: RE | Admit: 2019-01-09 | Discharge: 2019-01-09 | Disposition: A | Discharge status: Home / Self Care | Payer: No Typology Code available for payment source | Source: Ambulatory Visit | Attending: Interventional Radiology | Admitting: Interventional Radiology

## 2019-01-09 DIAGNOSIS — R413 Other amnesia: Secondary | ICD-10-CM | POA: Diagnosis present

## 2019-01-09 DIAGNOSIS — I63511 Cerebral infarction due to unspecified occlusion or stenosis of right middle cerebral artery: Secondary | ICD-10-CM | POA: Diagnosis present

## 2019-01-12 MED FILL — PRALUENT 150 MG/ML SOAJ: 150 | 28 days supply | Qty: 2 | Fill #1

## 2019-01-13 ENCOUNTER — Telehealth (HOSPITAL_COMMUNITY): Payer: Self-pay

## 2019-01-13 NOTE — Telephone Encounter (Signed)
Called pt regarding recent mri, no answer, left vm. AW 

## 2019-01-15 ENCOUNTER — Other Ambulatory Visit: Payer: Self-pay | Admitting: Family Medicine

## 2019-01-15 MED FILL — CLOPIDOGREL 75 MG TABLET: 75 | 90 days supply | Qty: 90 | Fill #0

## 2019-01-22 DIAGNOSIS — E7849 Other hyperlipidemia: Secondary | ICD-10-CM

## 2019-01-22 DIAGNOSIS — Z8249 Family history of ischemic heart disease and other diseases of the circulatory system: Secondary | ICD-10-CM

## 2019-01-27 NOTE — Telephone Encounter (Signed)
LP(a) ordered per Dr. Debara Pickett

## 2019-02-11 ENCOUNTER — Encounter: Payer: Self-pay | Admitting: Family Medicine

## 2019-02-11 ENCOUNTER — Encounter: Payer: Self-pay | Admitting: Adult Health

## 2019-02-12 MED FILL — PRALUENT 150 MG/ML SOAJ: 150 | 28 days supply | Qty: 2 | Fill #2

## 2019-02-14 LAB — NMR, LIPOPROFILE
Cholesterol, Total: 103 mg/dL (ref 100–199)
HDL Particle Number: 36.9 umol/L (ref >=30.5)
HDL-C: 43 mg/dL (ref >39)
LDL Particle Number: 597 nmol/L (ref <1000)
LDL Size: 20 nm — ABNORMAL LOW (ref >20.5)
LDL-C (NIH Calc): 41 mg/dL (ref 0–99)
LP-IR Score: 49 — ABNORMAL HIGH (ref <=45)
Small LDL Particle Number: 337 nmol/L (ref <=527)
Triglycerides: 99 mg/dL (ref 0–149)

## 2019-02-14 LAB — LIPOPROTEIN A (LPA): Lipoprotein (a): 50.2 nmol/L (ref <75.0)

## 2019-02-17 ENCOUNTER — Other Ambulatory Visit: Payer: Self-pay

## 2019-02-17 ENCOUNTER — Ambulatory Visit (INDEPENDENT_AMBULATORY_CARE_PROVIDER_SITE_OTHER): Payer: No Typology Code available for payment source | Admitting: Internal Medicine

## 2019-02-17 ENCOUNTER — Encounter: Payer: Self-pay | Admitting: Internal Medicine

## 2019-02-17 VITALS — BP 135/83 | HR 80 | Temp 98.1°F | Ht 71.0 in | Wt 215.0 lb

## 2019-02-17 DIAGNOSIS — G459 Transient cerebral ischemic attack, unspecified: Secondary | ICD-10-CM | POA: Diagnosis not present

## 2019-02-17 DIAGNOSIS — I10 Essential (primary) hypertension: Secondary | ICD-10-CM | POA: Diagnosis not present

## 2019-02-17 DIAGNOSIS — E7849 Other hyperlipidemia: Secondary | ICD-10-CM

## 2019-02-17 MED ORDER — CARVEDILOL 3.125 MG PO TABS: 3.1250 mg | ORAL_TABLET | Freq: Two times a day (BID) | ORAL | 3 refills | Status: DC

## 2019-02-17 MED FILL — CARVEDILOL 3.125 MG TABLET: 3.125 | 90 days supply | Qty: 180 | Fill #0

## 2019-02-17 NOTE — Patient Instructions (Signed)
Medication Instructions:  START carvedilol 3.125mg  twice daily CONTINUE other current medications  *If you need a refill on your cardiac medications before your next appointment, please call your pharmacy*  Lab Work: FASTING lab work in 6 months (prior to next visit with Dr. Debara Pickett) If you have labs (blood work) drawn today and your tests are completely normal, you will receive your results only by: Marland Kitchen MyChart Message (if you have MyChart) OR . A paper copy in the mail If you have any lab test that is abnormal or we need to change your treatment, we will call you to review the results.  Testing/Procedures: NONE  Follow-Up: At [facility], you and your health needs are our priority.  As part of our continuing mission to provide you with exceptional heart care, we have created designated Provider Care Teams.  These Care Teams include your primary Cardiologist (physician) and Advanced Practice Providers (APPs -  Physician Assistants and Nurse Practitioners) who all work together to provide you with the care you need, when you need it.  Your next appointment:   6 month(s)  The format for your next appointment:   Either In Person or Virtual  Provider:   Raliegh Ip Mali Hilty, MD  Other Instructions

## 2019-02-17 NOTE — Progress Notes (Signed)
LIPID CLINIC NOTE  Chief Complaint:  Follow-up dyslipidemia  Primary Care Physician: Zenia Resides, MD  Primary Cardiologist:  No primary care provider on file.  HPI:  Brad Holmes is a 48 y.o. male who was recently seen in a telemedicine visit for the evaluation and management of dyslipidemia.  He has a longstanding history of dyslipidemia probably dating back to his knowledge to his teenage years.  Unfortunately has a significant family history of heart disease in his father side including his father who died in his late 15s of a massive heart attack and his grandfather who is had bypass surgery (I believe) and numerous stents.  Brad Holmes first found out about his cholesterol interestingly when he was [place].  He was found to have a significantly elevated cholesterol at the time.  In the past he has been on atorvastatin which caused significant side effects however seems to be tolerating rosuvastatin.  He recently established care here and was married this past November.  His wife is a Designer, jewellery I believe in adolescent care with the [facility] system.  Lab work from June showed total cholesterol 361, triglycerides 303, HDL 40, LDL 260.  Of note he was not fasting during the study.  He did have some other labs done 9 years ago which showed a total cholesterol of 233 with triglycerides 36, HDL 35 and LDL 191.  At that time he had suffered a viral illness and had chest pain with an elevated troponin.  He underwent heart catheterization by Dr. Burt Knack which demonstrated no significant coronary disease.  He continues to report being asymptomatic however he has several children and recently being remarried is concerned about his cardiovascular risk and wishes to prevent events if possible.  12/02/2018  Brad Holmes returns today for follow-up of dyslipidemia.  He is tolerating rosuvastatin 40 mg and has had a significant decline in LDL cholesterol.  His  total cholesterol 212, triglycerides 171, HDL 33 and LDL 145 (decreased from 260).  Unfortunately, he suffered a TIA/stroke.  He ultimately had to undergo a cerebral angiography and was then found to have a moderate stenosis of the MCA.  He was also given TPA.  There were actually 2 back-to-back episodes.  Fortunately he has had neurologic recovery.  This does underscore the significant increased risk that he faces.  02/17/2019  Brad Holmes is seen today in follow-up.  He has had excellent response to lipid therapy.  His LDL P now is 597, LDL-C of 41, HDL 43 and triglycerides 99.  Small LDL-P3 137.  He is also had a decrease in LP(a) from 65.8-50.2, which likely is related to the Praluent.  Overall this is a very favorable lipid profile which we will need to maintain.  Recently reached out that he has been having some headaches.  Blood pressure is also been labile and was ranged from the 130s up to 160s.  I do think he would benefit from better blood pressure control..  Previously he was on some blood pressure medication but this was stopped apparently by neurology.  There was some confusion per neuroradiology as to why he was off his medicine.  PMHx:  Past Medical History:  Diagnosis Date  . Hypercholesterolemia   . Hypertension   . Stroke ([place])   . Transient ischemic attack     Past Surgical History:  Procedure Laterality Date  . CARDIAC CATHETERIZATION     05/12/09  . EYE  SURGERY    . IR 3D INDEPENDENT WKST  11/24/2018  . IR ANGIO INTRA EXTRACRAN SEL COM CAROTID INNOMINATE BILAT MOD SED  11/14/2018  . IR ANGIO INTRA EXTRACRAN SEL INTERNAL CAROTID UNI R MOD SED  11/24/2018  . IR ANGIO VERTEBRAL SEL SUBCLAVIAN INNOMINATE UNI R MOD SED  11/24/2018  . IR ANGIO VERTEBRAL SEL VERTEBRAL BILAT MOD SED  11/14/2018  . IR NEURO EACH ADD'L AFTER BASIC UNI RIGHT (MS)  11/24/2018  . RADIOLOGY WITH ANESTHESIA N/A 11/24/2018   Procedure: RADIOLOGY WITH ANESTHESIA  STENTING;  Surgeon: Luanne Bras, MD;   Location: [place];  Service: Radiology;  Laterality: N/A;  . WISDOM TOOTH EXTRACTION      FAMHx:  Family History  Problem Relation Age of Onset  . Hyperlipidemia Mother   . Heart disease Mother 3  . Heart failure Mother 59  . Colon cancer Neg Hx   . Colon polyps Neg Hx   . Esophageal cancer Neg Hx   . Rectal cancer Neg Hx   . Stomach cancer Neg Hx     SOCHx:   reports that he has never smoked. He has never used smokeless tobacco. He reports current alcohol use. He reports that he does not use drugs.  ALLERGIES:  Allergies  Allergen Reactions  . No Known Allergies     ROS: Pertinent items noted in HPI and remainder of comprehensive ROS otherwise negative.  HOME MEDS: Current Outpatient Medications on File Prior to Visit  Medication Sig Dispense Refill  . Alirocumab (PRALUENT) 150 MG/ML SOAJ Inject 1 Dose into the skin every 14 (fourteen) days. 6 pen 3  . aspirin 325 MG EC tablet Take 1 tablet (325 mg total) by mouth daily. 30 tablet 0  . clopidogrel (PLAVIX) 75 MG tablet TAKE 1 TABLET (75 MG TOTAL) BY MOUTH DAILY. 90 tablet 3  . rosuvastatin (CRESTOR) 40 MG tablet Take 1 tablet (40 mg total) by mouth daily. 90 tablet 3   No current facility-administered medications on file prior to visit.    LABS/IMAGING: No results found for this or any previous visit (from the past 48 hour(s)). No results found.  LIPID PANEL:    Component Value Date/Time   CHOL 212 (H) 11/14/2018 1758   CHOL 361 (H) 08/11/2018 0942   TRIG 171 (H) 11/14/2018 1758   HDL 33 (L) 11/14/2018 1758   HDL 40 08/11/2018 0942   CHOLHDL 6.4 11/14/2018 1758   VLDL 34 11/14/2018 1758   LDLCALC 145 (H) 11/14/2018 1758   LDLCALC 260 (H) 08/11/2018 0942    WEIGHTS: Wt Readings from Last 3 Encounters:  02/17/19 215 lb (97.5 kg)  12/23/18 209 lb 3.2 oz (94.9 kg)  12/02/18 209 lb (94.8 kg)    VITALS: BP 135/83   Pulse 80   Temp 98.1 F (36.7 C)   Ht 5\' 11"  (1.803 m)   Wt 215 lb (97.5 kg)   SpO2 98%    BMI 29.99 kg/m   EXAM: Deferred  EKG: Deferred  ASSESSMENT: 1. Familial hyperlipidemia 2. Recent TIA/stroke x2 3. Strong family history of premature coronary disease  4. Hypertension  PLAN: 1.   Brad Holmes has had significant improvement in his lipids both on Praluent and rosuvastatin.  He is now at target.  He is tolerating medication without any significant side effects.  He has had some labile hypertension.  He seems to be having some headaches which would not likely related to transient blood pressure elevations but could benefit from treatment  if they are migrainous in nature.  Would recommend starting low-dose Coreg 3.125 mg twice daily.  Monitor BP and symptoms.  Plan follow-up with me in 6 months or sooner as necessary.  Pixie Casino, MD, [facility], [place] Director of the Advanced Lipid Disorders &  Cardiovascular Risk Reduction Clinic Diplomate of the American Board of Clinical Lipidology Attending Cardiologist  Direct Dial: [phone]  Fax: [phone]  Website:  [email or web address] 02/17/2019, 11:30 AM

## 2019-03-17 MED FILL — PRALUENT 150 MG/ML SOAJ: 150 | 28 days supply | Qty: 2 | Fill #3

## 2019-03-17 MED FILL — ROSUVASTATIN CALCIUM 40 MG: 40 | 90 days supply | Qty: 90 | Fill #2

## 2019-03-19 ENCOUNTER — Other Ambulatory Visit (HOSPITAL_COMMUNITY): Payer: Self-pay | Admitting: Interventional Radiology

## 2019-03-19 DIAGNOSIS — I63511 Cerebral infarction due to unspecified occlusion or stenosis of right middle cerebral artery: Secondary | ICD-10-CM

## 2019-04-01 ENCOUNTER — Other Ambulatory Visit: Payer: Self-pay

## 2019-04-01 ENCOUNTER — Ambulatory Visit (HOSPITAL_COMMUNITY)
Admission: RE | Admit: 2019-04-01 | Discharge: 2019-04-01 | Disposition: A | Discharge status: Home / Self Care | Payer: No Typology Code available for payment source | Source: Ambulatory Visit | Attending: Interventional Radiology | Admitting: Interventional Radiology

## 2019-04-01 DIAGNOSIS — I63511 Cerebral infarction due to unspecified occlusion or stenosis of right middle cerebral artery: Secondary | ICD-10-CM | POA: Insufficient documentation

## 2019-04-01 MED ORDER — IOHEXOL 350 MG/ML SOLN
100.0000 mL | Freq: Once | INTRAVENOUS | Status: AC | PRN
  Administered 2019-04-01: 100 mL via INTRAVENOUS

## 2019-04-06 ENCOUNTER — Telehealth (HOSPITAL_COMMUNITY): Payer: Self-pay

## 2019-04-06 NOTE — Telephone Encounter (Signed)
Pt agreed to f/u in 6 months with cta head/neck. AW 

## 2019-04-08 MED FILL — PRALUENT 150 MG/ML SOAJ: 150 | 28 days supply | Qty: 2 | Fill #4

## 2019-04-20 MED FILL — PRALUENT 150 MG/ML SOAJ: 150 | 28 days supply | Qty: 2 | Fill #4

## 2019-04-30 MED FILL — CLOPIDOGREL 75 MG TABLET: 75 | 90 days supply | Qty: 90 | Fill #1

## 2019-05-11 MED FILL — CARVEDILOL 3.125 MG TABLET: 3.125 | 90 days supply | Qty: 180 | Fill #1

## 2019-05-15 MED FILL — PRALUENT 150 MG/ML SOAJ: 150 | 28 days supply | Qty: 2 | Fill #5

## 2019-05-28 ENCOUNTER — Ambulatory Visit: Payer: No Typology Code available for payment source | Attending: Internal Medicine

## 2019-05-28 DIAGNOSIS — Z23 Encounter for immunization: Secondary | ICD-10-CM

## 2019-05-28 NOTE — Progress Notes (Signed)
   Covid-19 Vaccination Clinic  Name:  Brad Holmes    MRN: [id number] DOB: [birth year]  05/28/2019  Brad Holmes was observed post Covid-19 immunization for 15 minutes without incident. He was provided with Vaccine Information Sheet and instruction to access the V-Safe system.   Brad Holmes was instructed to call 911 with any severe reactions post vaccine: Marland Kitchen Difficulty breathing  . Swelling of face and throat  . A fast heartbeat  . A bad rash all over body  . Dizziness and weakness   Immunizations Administered    Name Date Dose VIS Date Route   Pfizer COVID-19 Vaccine 05/28/2019  4:19 PM 0.3 mL 02/13/2019 Intramuscular   Manufacturer: [place]   Lot: [id number]   [place]: [id number]

## 2019-06-22 ENCOUNTER — Ambulatory Visit: Payer: No Typology Code available for payment source

## 2019-06-24 MED FILL — PRALUENT 150 MG/ML SOAJ: 150 | 28 days supply | Qty: 2 | Fill #6

## 2019-06-24 MED FILL — ROSUVASTATIN CALCIUM 40 MG: 40 | 90 days supply | Qty: 90 | Fill #3

## 2019-07-22 ENCOUNTER — Telehealth: Payer: Self-pay | Admitting: Internal Medicine

## 2019-07-22 NOTE — Telephone Encounter (Signed)
Called patient 07/22/19, left message for patient to call in to be scheduled for follow up with Dr. Debara Pickett

## 2019-08-10 ENCOUNTER — Encounter: Payer: Self-pay | Admitting: Internal Medicine

## 2019-08-10 ENCOUNTER — Telehealth (INDEPENDENT_AMBULATORY_CARE_PROVIDER_SITE_OTHER): Payer: No Typology Code available for payment source | Admitting: Internal Medicine

## 2019-08-10 VITALS — BP 136/78 | HR 71 | Ht 71.0 in | Wt 209.0 lb

## 2019-08-10 DIAGNOSIS — I10 Essential (primary) hypertension: Secondary | ICD-10-CM

## 2019-08-10 DIAGNOSIS — E7849 Other hyperlipidemia: Secondary | ICD-10-CM

## 2019-08-10 DIAGNOSIS — G459 Transient cerebral ischemic attack, unspecified: Secondary | ICD-10-CM | POA: Diagnosis not present

## 2019-08-10 NOTE — Progress Notes (Signed)
Virtual Visit via Video Note   This visit type was conducted due to national recommendations for restrictions regarding the COVID-19 Pandemic (e.g. social distancing) in an effort to limit this patient's exposure and mitigate transmission in our community.  Due to his co-morbid illnesses, this patient is at least at moderate risk for complications without adequate follow up.  This format is felt to be most appropriate for this patient at this time.  All issues noted in this document were discussed and addressed.  A limited physical exam was performed with this format.  Please refer to the patient's chart for his consent to telehealth for [facility].   Evaluation Performed:  Telephone visit  Date:  08/10/2019   ID:  Brad Holmes, DOB 01/14/1971, MRN [id number]  Patient Location:  [address] [place] [address]  Provider location:   [address], [place], [place] [address]  PCP:  Zenia Resides, MD  Cardiologist:  No primary care provider on file. Electrophysiologist:  None   Chief Complaint:  Manage dyslipidemia  History of Present Illness:    Brad Holmes is a 49 y.o. male who presents via audio/video conferencing for a telehealth visit today.  Brad Holmes is a pleasant 50 year old male who is kindly referred today for evaluation and management of dyslipidemia.  He has a longstanding history of dyslipidemia probably dating back to his knowledge to his teenage years.  Unfortunately has a significant family history of heart disease in his father side including his father who died in his late 76s of a massive heart attack and his grandfather who is had bypass surgery (I believe) and numerous stents.  Brad Holmes first found out about his cholesterol interestingly when he was [place].  He was found to have a significantly elevated cholesterol at the time.  In the past he has been on atorvastatin which caused significant side effects  however seems to be tolerating rosuvastatin.  He recently established care here and was married this past November.  His wife is a Designer, jewellery I believe in adolescent care with the [facility] system.  Lab work from June showed total cholesterol 361, triglycerides 303, HDL 40, LDL 260.  Of note he was not fasting during the study.  He did have some other labs done 9 years ago which showed a total cholesterol of 233 with triglycerides 36, HDL 35 and LDL 191.  At that time he had suffered a viral illness and had chest pain with an elevated troponin.  He underwent heart catheterization by Dr. Burt Knack which demonstrated no significant coronary disease.  He continues to report being asymptomatic however he has several children and recently being remarried is concerned about his cardiovascular risk and wishes to prevent events if possible.  12/02/2018  Brad Holmes returns today for follow-up of dyslipidemia.  He is tolerating rosuvastatin 40 mg and has had a significant decline in LDL cholesterol.  His total cholesterol 212, triglycerides 171, HDL 33 and LDL 145 (decreased from 260).  Unfortunately, he suffered a TIA/stroke.  He ultimately had to undergo a cerebral angiography and was then found to have a moderate stenosis of the MCA.  He was also given TPA.  There were actually 2 back-to-back episodes.  Fortunately he has had neurologic recovery.  This does underscore the significant increased risk that he faces.  02/17/2019  Brad Holmes is seen today in follow-up.  He has had excellent response to lipid therapy.  His LDL P now is 597, LDL-C of 41, HDL 43 and triglycerides 99.  Small LDL-P3 137.  He is also had a decrease in LP(a) from 65.8-50.2, which likely is related to the Praluent.  Overall this is a very favorable lipid profile which we will need to maintain.  Recently reached out that he has been having some headaches.  Blood pressure is also been labile and was ranged from the 130s up to 160s.  I do think he  would benefit from better blood pressure control..  Previously he was on some blood pressure medication but this was stopped apparently by neurology.  There was some confusion per neuroradiology as to why he was off his medicine.  08/10/2019  Brad Holmes is without complaints.  He denies any new TIA events.  His cholesterol has been very well controlled in December.  He did not have it reassessed although his diet he said is not as optimal now.  He moved a few months ago and is just starting to get back into a routine.  Hopefully he will get back to where he was in December and will likely plan to lipids in about 6 months.  He does report some bruising on combination full dose aspirin and Plavix.  The patient does not have symptoms concerning for COVID-19 infection (fever, chills, cough, or new SHORTNESS OF BREATH).    Prior CV studies:   The following studies were reviewed today:  Chart reviewed Lab work reviewed Cath report reviewed  PMHx:  Past Medical History:  Diagnosis Date  . Hypercholesterolemia   . Hypertension   . Stroke ([place])   . Transient ischemic attack     Past Surgical History:  Procedure Laterality Date  . CARDIAC CATHETERIZATION     05/12/09  . EYE SURGERY    . IR 3D INDEPENDENT WKST  11/24/2018  . IR ANGIO INTRA EXTRACRAN SEL COM CAROTID INNOMINATE BILAT MOD SED  11/14/2018  . IR ANGIO INTRA EXTRACRAN SEL INTERNAL CAROTID UNI R MOD SED  11/24/2018  . IR ANGIO VERTEBRAL SEL SUBCLAVIAN INNOMINATE UNI R MOD SED  11/24/2018  . IR ANGIO VERTEBRAL SEL VERTEBRAL BILAT MOD SED  11/14/2018  . IR NEURO EACH ADD'L AFTER BASIC UNI RIGHT (MS)  11/24/2018  . RADIOLOGY WITH ANESTHESIA N/A 11/24/2018   Procedure: RADIOLOGY WITH ANESTHESIA  STENTING;  Surgeon: Luanne Bras, MD;  Location: [facility];  Service: Radiology;  Laterality: N/A;  . WISDOM TOOTH EXTRACTION      FAMHx:  Family History  Problem Relation Age of Onset  . Hyperlipidemia Mother   . Heart disease Mother 58  .  Heart failure Mother 13  . Colon cancer Neg Hx   . Colon polyps Neg Hx   . Esophageal cancer Neg Hx   . Rectal cancer Neg Hx   . Stomach cancer Neg Hx     SOCHx:   reports that he has never smoked. He has never used smokeless tobacco. He reports current alcohol use. He reports that he does not use drugs.  ALLERGIES:  Allergies  Allergen Reactions  . No Known Allergies     MEDS:  Current Meds  Medication Sig  . Alirocumab (PRALUENT) 150 MG/ML SOAJ Inject 1 Dose into the skin every 14 (fourteen) days.  Marland Kitchen aspirin 325 MG EC tablet Take 1 tablet (325 mg total) by mouth daily.  . clopidogrel (PLAVIX) 75 MG tablet TAKE 1 TABLET (75 MG TOTAL) BY MOUTH DAILY.  . rosuvastatin (CRESTOR) 40 MG  tablet Take 1 tablet (40 mg total) by mouth daily.     ROS: Pertinent items are noted in HPI.  Labs/Other Tests and Data Reviewed:    Recent Labs: 11/14/2018: ALT 35 11/24/2018: BUN 16; Creatinine, Ser 0.93; Hemoglobin 15.1; Platelets 256; Potassium 4.7; Sodium 140   Recent Lipid Panel Lab Results  Component Value Date/Time   CHOL 212 (H) 11/14/2018 05:58 PM   CHOL 361 (H) 08/11/2018 09:42 AM   TRIG 171 (H) 11/14/2018 05:58 PM   HDL 33 (L) 11/14/2018 05:58 PM   HDL 40 08/11/2018 09:42 AM   CHOLHDL 6.4 11/14/2018 05:58 PM   LDLCALC 145 (H) 11/14/2018 05:58 PM   LDLCALC 260 (H) 08/11/2018 09:42 AM    Wt Readings from Last 3 Encounters:  08/10/19 209 lb (94.8 kg)  02/17/19 215 lb (97.5 kg)  12/23/18 209 lb 3.2 oz (94.9 kg)     Exam:    Vital Signs:  BP 136/78   Pulse 71   Ht 5\' 11"  (1.803 m)   Wt 209 lb (94.8 kg)   BMI 29.15 kg/m    Deferred  ASSESSMENT & PLAN:    1. Probable familial hyperlipidemia, Dutch score 6 2. Strong family history of premature coronary disease 3. Angiographically normal coronaries by cath in 2011, NSTEMI  4. History of TIA (11/2018) - Right MCA stenosis in the distal M1 segment  Brad Holmes has had significant improvement in his lipids with LDL-P ER  597, LDL-C of 41, HDL-C of 43 and triglycerides 99.  Small LDL particle number of 337.  Overall excellent response to combination of statin and Praluent.  Recently has had less optimal diet as he moved and had been eating more out.  He will start to work on that again.  No further TIAs.  He reports extensive bruising on combination full dose aspirin and Plavix.  I advised him to speak with his neurologist/neuro interventional list asked to see if he could possibly reduce aspirin to 81 mg a day in addition to the Plavix.  We will plan repeat lipids in 6 months just prior to a follow-up visit.   COVID-19 Education: The signs and symptoms of COVID-19 were discussed with the patient and how to seek care for testing (follow up with PCP or arrange E-visit).  The importance of social distancing was discussed today.  Patient Risk:   After full review of this patients clinical status, I feel that they are at least moderate risk at this time.  Time:   Today, I have spent 25 minutes with the patient with telehealth technology discussing dyslipidemia, probable familial hyperlipidemia, strong family history of coronary disease, genetic risk and other factors..     Medication Adjustments/Labs and Tests Ordered: Current medicines are reviewed at length with the patient today.  Concerns regarding medicines are outlined above.   Tests Ordered: No orders of the defined types were placed in this encounter.   Medication Changes: No orders of the defined types were placed in this encounter.   Disposition:  in 6 month(s)  Pixie Casino, MD, [facility], [place] Director of the Advanced Lipid Disorders &  Cardiovascular Risk Reduction Clinic Diplomate of the American Board of Clinical Lipidology Attending Cardiologist  Direct Dial: [phone]  Fax: [phone]  Website:  [email or web address]  Pixie Casino, MD  08/10/2019 8:14 AM

## 2019-08-10 NOTE — Patient Instructions (Signed)
Medication Instructions:  Your physician recommends that you continue on your current medications as directed. Please refer to the Current Medication list given to you today.  *If you need a refill on your cardiac medications before your next appointment, please call your pharmacy*   Lab Work: FASTING lipid panel in 6 months  If you have labs (blood work) drawn today and your tests are completely normal, you will receive your results only by:  [place] (if you have MyChart) OR  A paper copy in the mail If you have any lab test that is abnormal or we need to change your treatment, we will call you to review the results.   Testing/Procedures: NONE   Follow-Up: At [facility], you and your health needs are our priority.  As part of our continuing mission to provide you with exceptional heart care, we have created designated Provider Care Teams.  These Care Teams include your primary Cardiologist (physician) and Advanced Practice Providers (APPs -  Physician Assistants and Nurse Practitioners) who all work together to provide you with the care you need, when you need it.  We recommend signing up for the patient portal called "MyChart".  Sign up information is provided on this After Visit Summary.  MyChart is used to connect with patients for Virtual Visits (Telemedicine).  Patients are able to view lab/test results, encounter notes, upcoming appointments, etc.  Non-urgent messages can be sent to your provider as well.   To learn more about what you can do with MyChart, go to [email or web address].    Your next appointment:   6 month(s)  The format for your next appointment:   Either In Person or Virtual  Provider:   You may see Dr. Wilnette Kales or one of the following Advanced Practice Providers on your designated Care Team:    Almyra Deforest, PA-C  Fabian Sharp, [place] or   Roby Lofts, [place]    Other Instructions

## 2019-08-24 MED FILL — CLOPIDOGREL 75 MG TABLET: 75 | 90 days supply | Qty: 90 | Fill #2

## 2019-08-24 MED FILL — CARVEDILOL 3.125 MG TABLET: 3.125 | 90 days supply | Qty: 180 | Fill #2

## 2019-09-11 MED FILL — PRALUENT 150 MG/ML SOAJ: 150 | 28 days supply | Qty: 2 | Fill #7

## 2019-09-15 ENCOUNTER — Other Ambulatory Visit (HOSPITAL_COMMUNITY): Payer: Self-pay | Admitting: Interventional Radiology

## 2019-09-15 DIAGNOSIS — I63511 Cerebral infarction due to unspecified occlusion or stenosis of right middle cerebral artery: Secondary | ICD-10-CM

## 2019-09-28 ENCOUNTER — Ambulatory Visit (HOSPITAL_COMMUNITY)
Admission: RE | Admit: 2019-09-28 | Discharge: 2019-09-28 | Disposition: A | Discharge status: Home / Self Care | Payer: No Typology Code available for payment source | Source: Ambulatory Visit | Attending: Interventional Radiology | Admitting: Interventional Radiology

## 2019-09-28 ENCOUNTER — Other Ambulatory Visit: Payer: Self-pay

## 2019-09-28 DIAGNOSIS — I63511 Cerebral infarction due to unspecified occlusion or stenosis of right middle cerebral artery: Secondary | ICD-10-CM | POA: Diagnosis not present

## 2019-09-28 MED ORDER — IOHEXOL 350 MG/ML SOLN
75.0000 mL | Freq: Once | INTRAVENOUS | Status: AC | PRN
  Administered 2019-09-28: 75 mL via INTRAVENOUS

## 2019-09-30 ENCOUNTER — Telehealth (HOSPITAL_COMMUNITY): Payer: Self-pay

## 2019-09-30 NOTE — Telephone Encounter (Signed)
Called pt regarding recent imaging, no answer, left vm. AW  

## 2019-10-05 ENCOUNTER — Telehealth (HOSPITAL_COMMUNITY): Payer: Self-pay

## 2019-10-05 NOTE — Telephone Encounter (Signed)
Pt agreed to f/u in 6 months with cta head/neck. He also wanted to know if he should change his medication (ASA and Plavix) due to lots of bruising. I've sent a message to our PA to discuss with Dr. Estanislado Pandy. Will get back to patient after. Pt agreed. AW

## 2019-10-13 ENCOUNTER — Telehealth (HOSPITAL_COMMUNITY): Payer: Self-pay

## 2019-10-13 NOTE — Telephone Encounter (Signed)
Pt will come this week for p2y12. AW

## 2019-10-24 ENCOUNTER — Other Ambulatory Visit: Payer: Self-pay | Admitting: Family Medicine

## 2019-10-24 DIAGNOSIS — E7800 Pure hypercholesterolemia, unspecified: Secondary | ICD-10-CM

## 2019-10-25 IMAGING — CT CT HEAD CODE STROKE
3 series · 15 of 47 positions shown, 18 images · non-contrast
Comparison: None.

CLINICAL DATA: Code stroke.  Slurred speech and left-sided weakness

EXAM:
CT HEAD WITHOUT CONTRAST
TECHNIQUE: Contiguous axial images were obtained from the base of the skull
through the vertex without intravenous contrast.

[Series 3: head 5.0 st · axial · 0.44mm/px · z∈[-112,+38]mm · 9 of 36 slices shown, 12 images]
[im 3/36  brain]
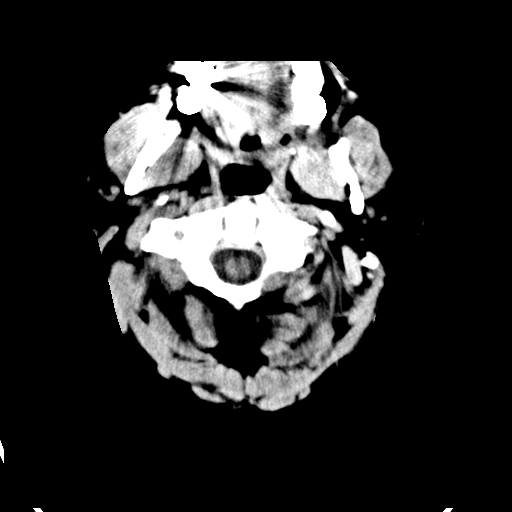
[im 3/36  bone]
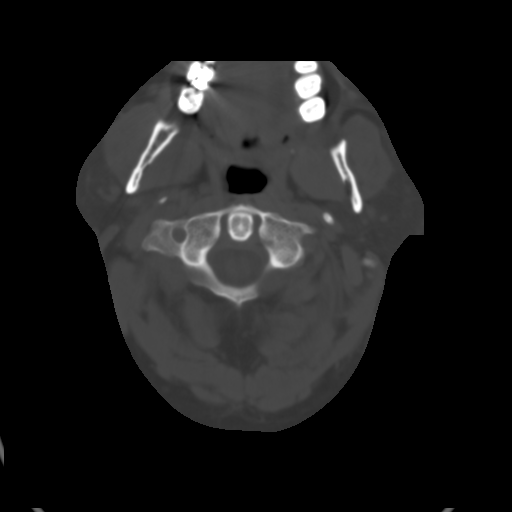
[im 7/36  brain]
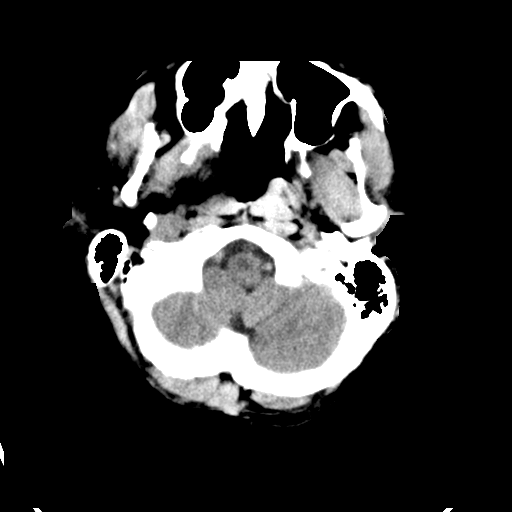
[im 10/36  brain]
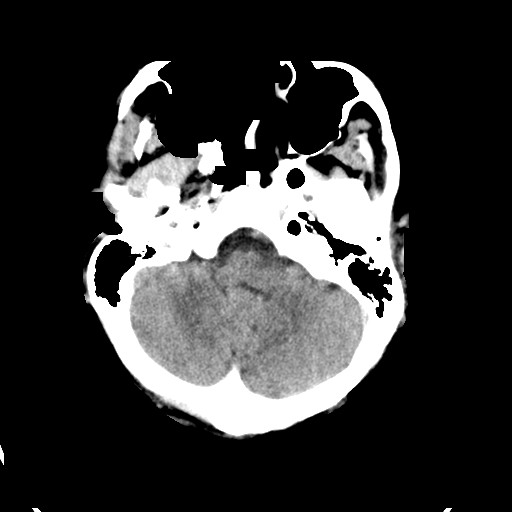
[im 14/36  brain]
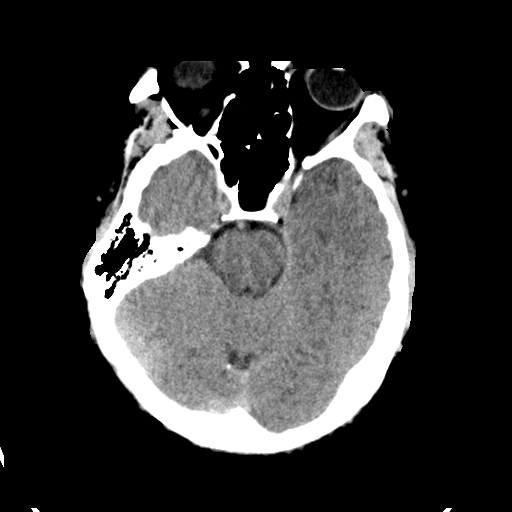
[im 19/36  brain]
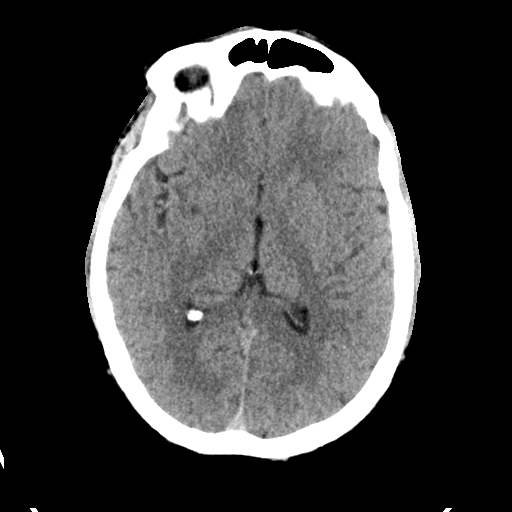
[im 19/36  bone]
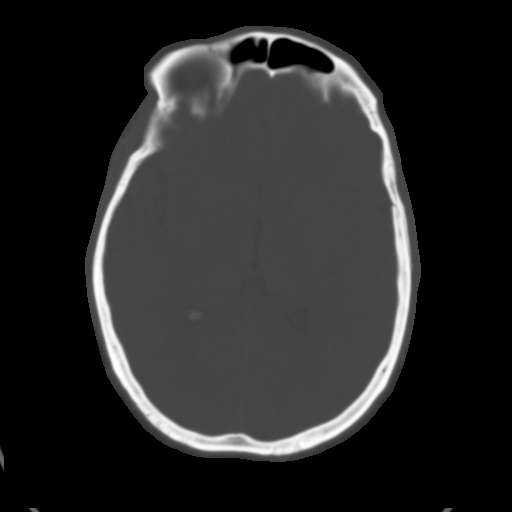
[im 22/36  brain]
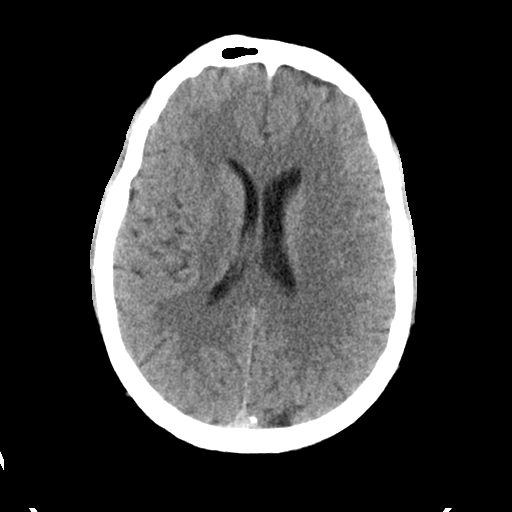
[im 26/36  brain]
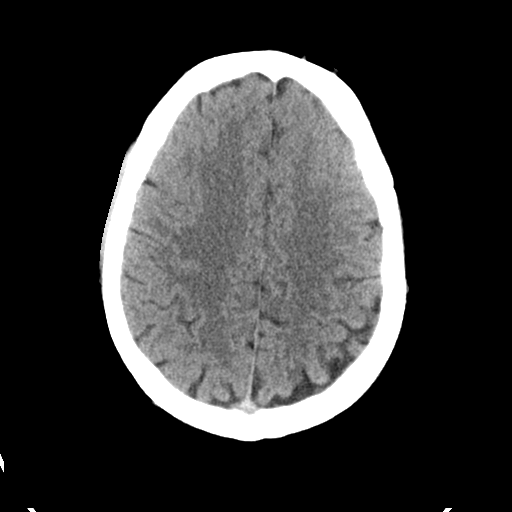
[im 29/36  brain]
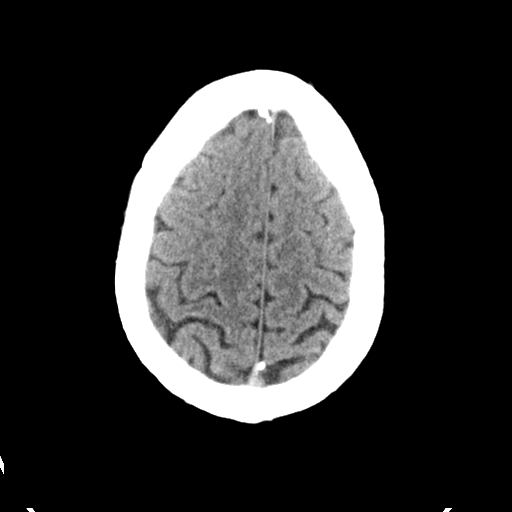
[im 33/36  brain]
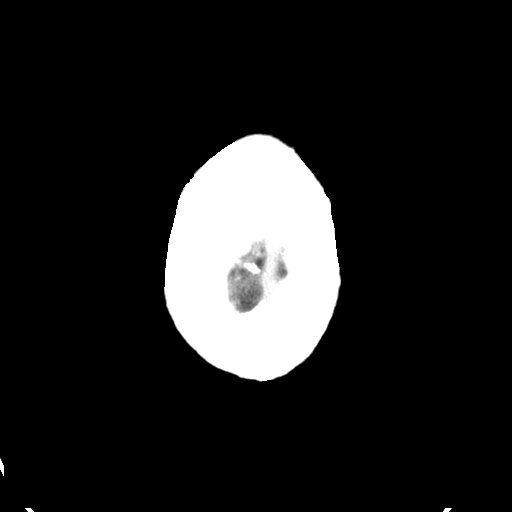
[im 33/36  bone]
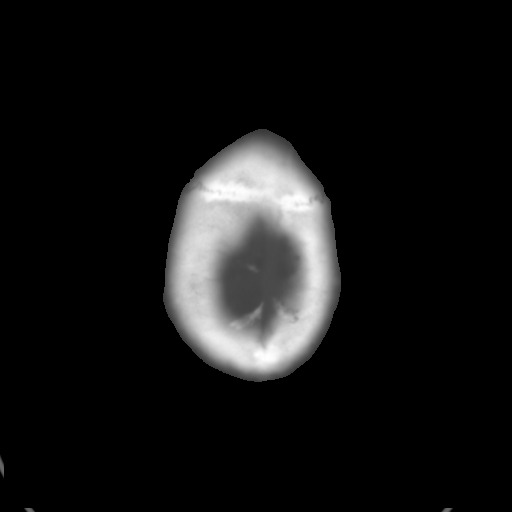

[Series 5: head 3.0 cor st · coronal · 0.35mm/px · 3 of 67 slices shown]
[im 23/67  brain]
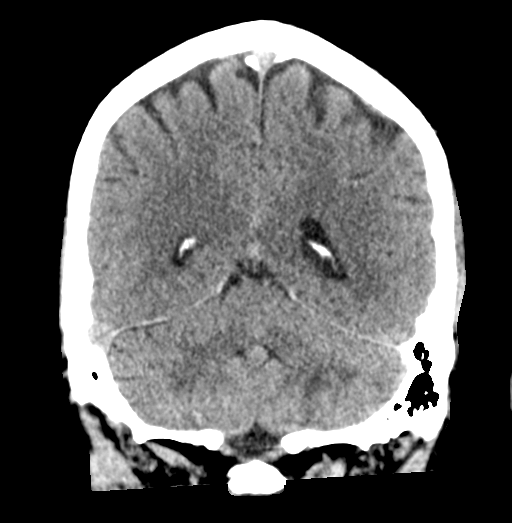
[im 30/67  brain]
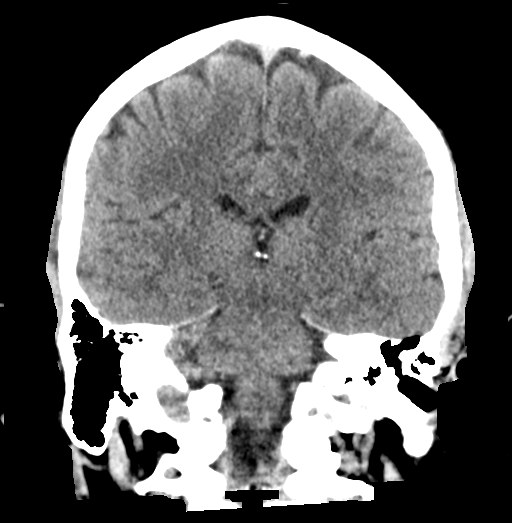
[im 37/67  brain]
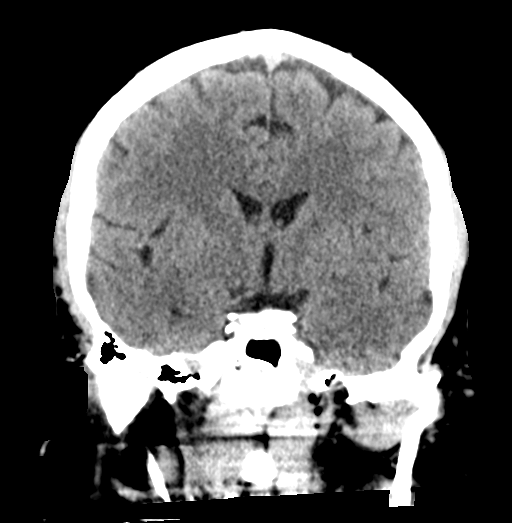

[Series 6: head 3.0 sag st · sagittal · 0.36mm/px · 3 of 60 slices shown]
[im 20/60  brain]
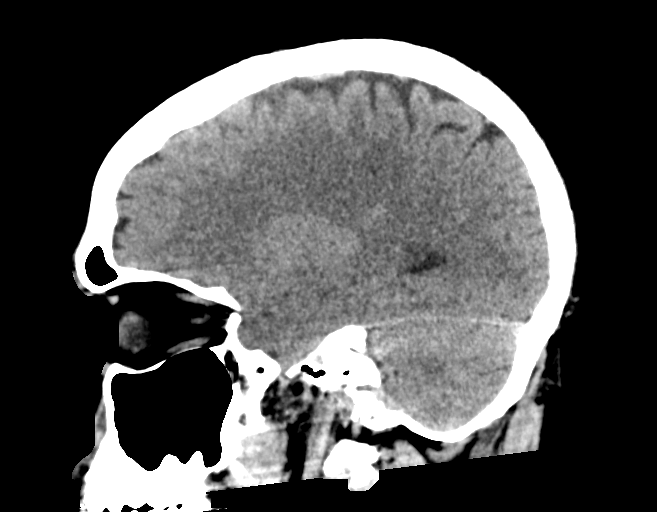
[im 30/60  brain]
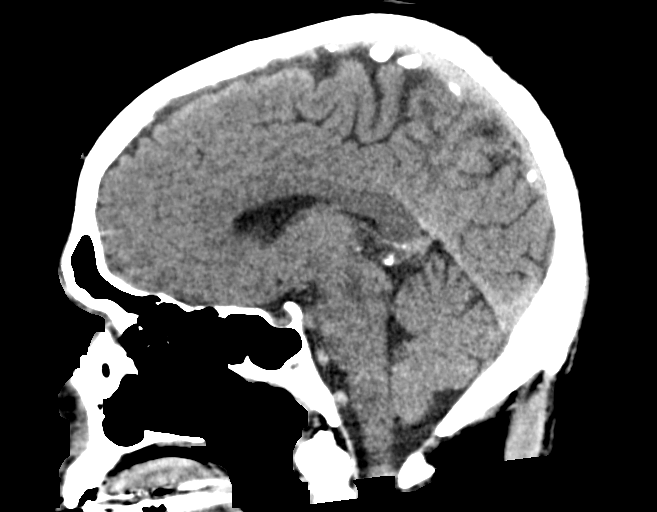
[im 40/60  brain]
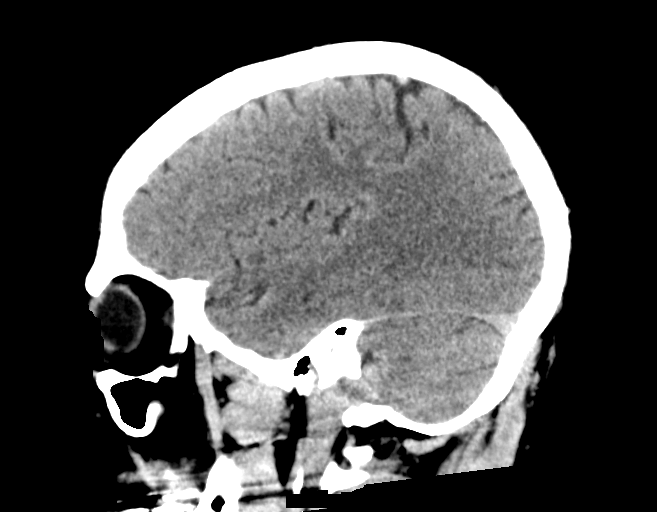

[15 of 47 positions shown; findings below may reference images not displayed]

FINDINGS: Brain: There is no mass, hemorrhage or extra-axial collection. The
size and configuration of the ventricles and extra-axial CSF spaces
are normal. The brain parenchyma is normal, without evidence of
acute or chronic infarction.

Vascular: No abnormal hyperdensity of the major intracranial
arteries or dural venous sinuses. No intracranial atherosclerosis.

Skull: The visualized skull base, calvarium and extracranial soft
tissues are normal.

Sinuses/Orbits: No fluid levels or advanced mucosal thickening of
the visualized paranasal sinuses. No mastoid or middle ear effusion.
The orbits are normal.

ASPECTS (Alberta Stroke Program Early CT Score)

- Ganglionic level infarction (caudate, lentiform nuclei, internal
capsule, insula, M1-M3 cortex): 7

- Supraganglionic infarction (M4-M6 cortex): 3

Total score (0-10 with 10 being normal): 10
IMPRESSION: 1. No intracranial hemorrhage.
2. ASPECTS is 10.

These results were communicated to Dr. Aramy Archie at [DATE] on
11/14/2018 by text page via the [employer] messaging system.

## 2019-10-25 IMAGING — MR MR HEAD W/O CM
12 of 17 series · 34 of 48 positions shown · non-contrast
Comparison: CTA and CT perfusion 11/14/2018
COMPARISON: CTA and CT perfusion 11/14/2018

Addendum:
CLINICAL DATA: TIA initial exam

EXAM:
MRI HEAD WITHOUT CONTRAST
TECHNIQUE: Multiplanar, multiecho pulse sequences of the brain and surrounding
structures were obtained without intravenous contrast.

[Series 5: DWI · axial · 3.0mm · 0.88mm/px · z∈[-46,+98]mm · 5 of 98 slices shown (1 of 4)]
[im 1/98]
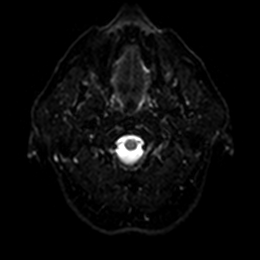
[im 25/98]
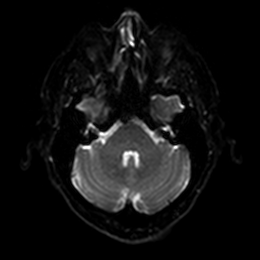
[im 49/98]
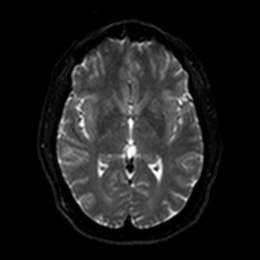
[im 73/98]
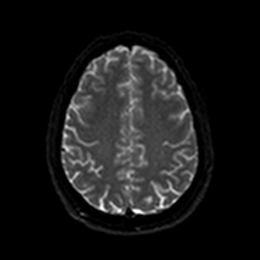
[im 98/98]
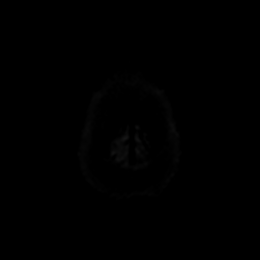

[Series 6: DWI · axial · 3.0mm · 0.88mm/px · z∈[-46,+98]mm · 2 of 49 slices shown (2 of 4)]
[im 1/49]
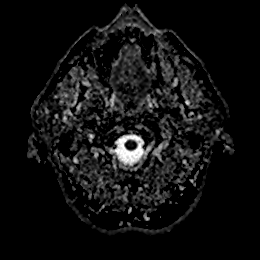
[im 49/49]
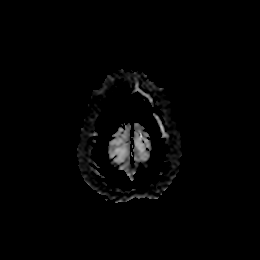

[Series 7: DWI · coronal · 4.0mm · 0.88mm/px · 3 of 68 slices shown (3 of 4)]
[im 1/68]
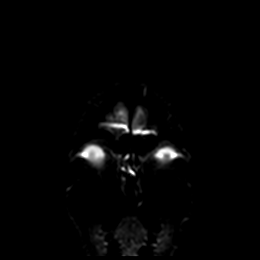
[im 34/68]
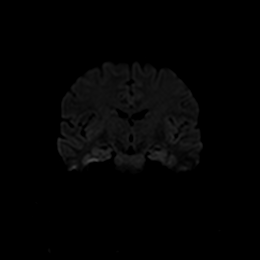
[im 68/68]
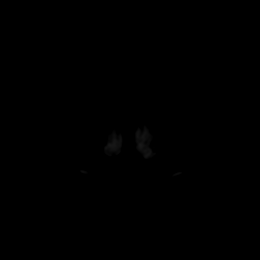

[Series 8: DWI · coronal · 4.0mm · 0.88mm/px · 1 of 34 slices shown (4 of 4)]
[im 1/34]
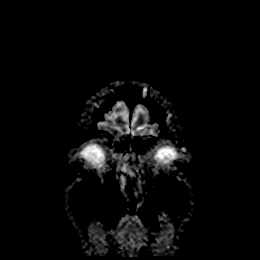

[Series 9: T1 · sagittal · 5.0mm · 0.75mm/px · 2 of 25 slices shown]
[im 1/25]
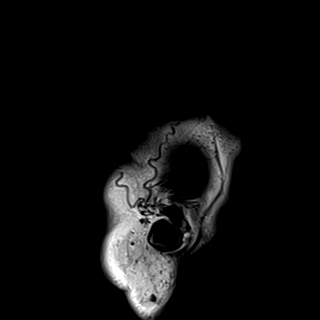
[im 25/25]
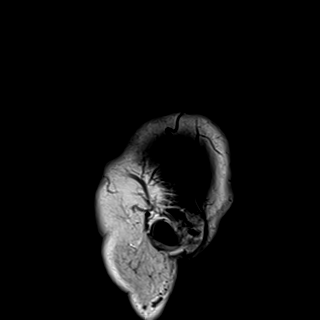

[Series 10: T2 · axial · 5.0mm · 0.72mm/px · z∈[-44,+100]mm · 2 of 25 slices shown (1 of 2)]
[im 1/25]
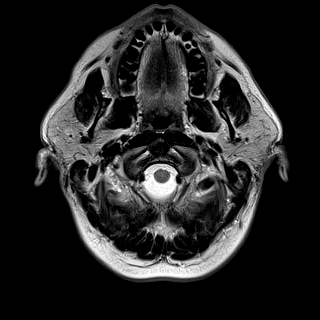
[im 25/25]
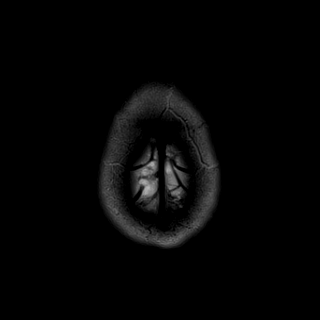

[Series 11: FLAIR · axial · 5.0mm · 0.45mm/px · z∈[-45,+99]mm · 2 of 25 slices shown]
[im 1/25]
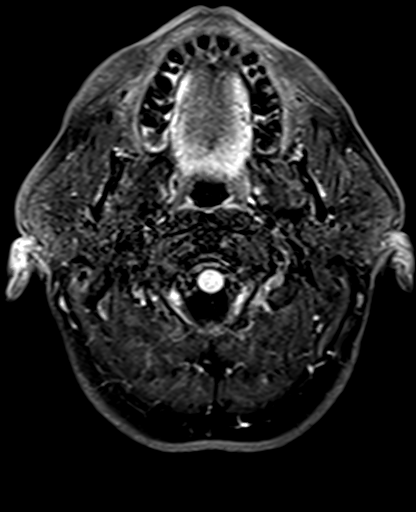
[im 25/25]
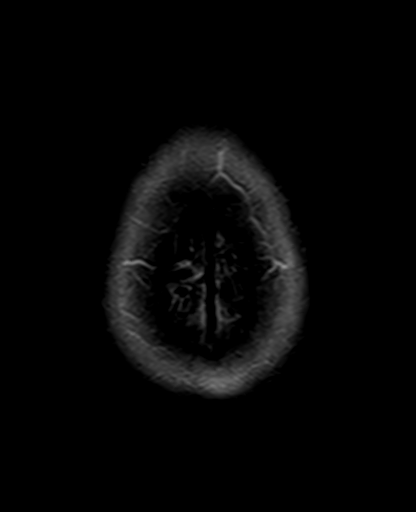

[Series 12: mag_images · axial · 3.0mm · 0.90mm/px · z∈[-57,+120]mm · 4 of 60 slices shown]
[im 1/60]
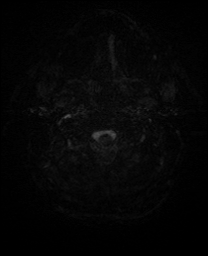
[im 20/60]
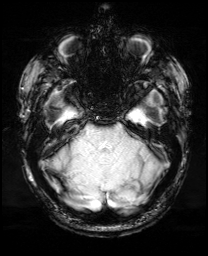
[im 40/60]
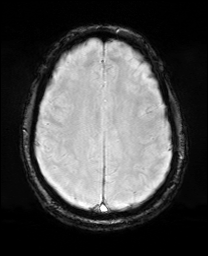
[im 60/60]
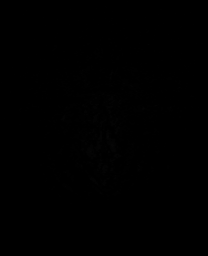

[Series 13: pha_images · axial · 3.0mm · 0.90mm/px · z∈[-54,+120]mm · 4 of 59 slices shown]
[im 1/59]
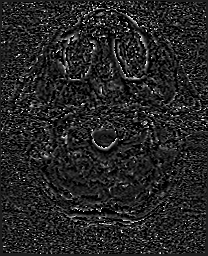
[im 20/59]
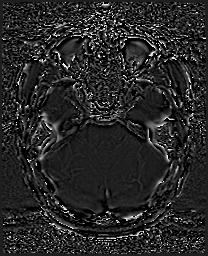
[im 39/59]
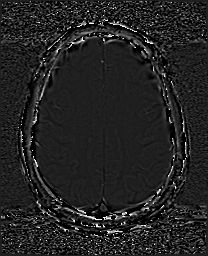
[im 59/59]
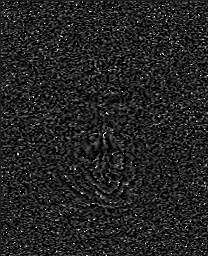

[Series 14: swi_images · axial · 3.0mm · 0.90mm/px · z∈[-57,+120]mm · 4 of 60 slices shown]
[im 1/60]
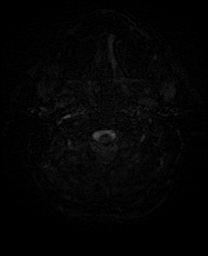
[im 20/60]
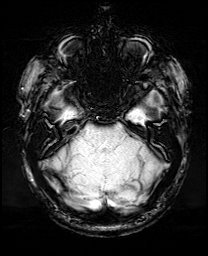
[im 40/60]
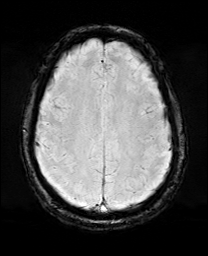
[im 60/60]
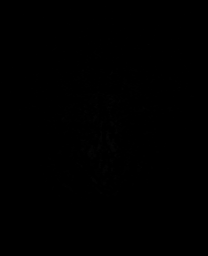

[Series 15: mip_images(sw) · axial · 24.0mm · 0.90mm/px · z∈[-46,+110]mm · 3 of 53 slices shown]
[im 1/53]
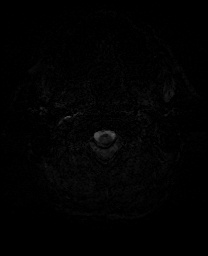
[im 27/53]
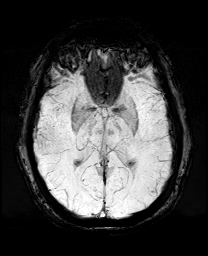
[im 53/53]
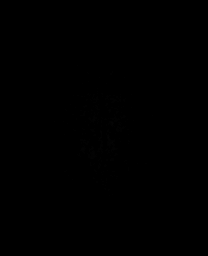

[Series 17: T2 · coronal · 5.0mm · 0.34mm/px · 2 of 29 slices shown (2 of 2)]
[im 1/29]
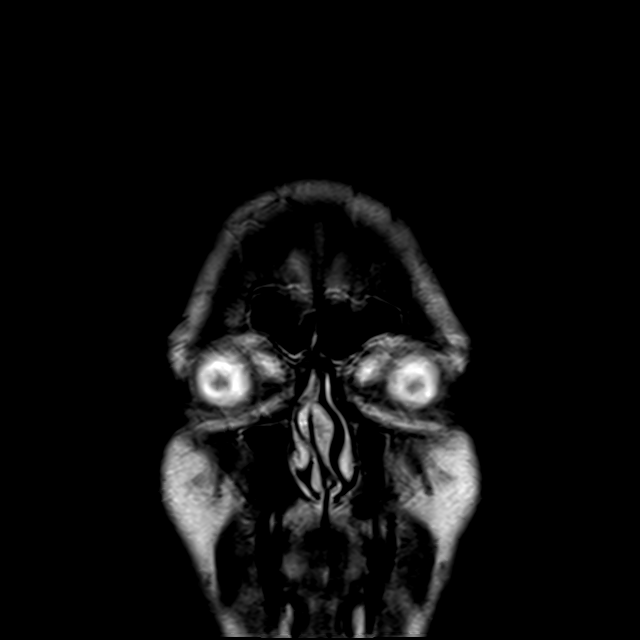
[im 29/29]
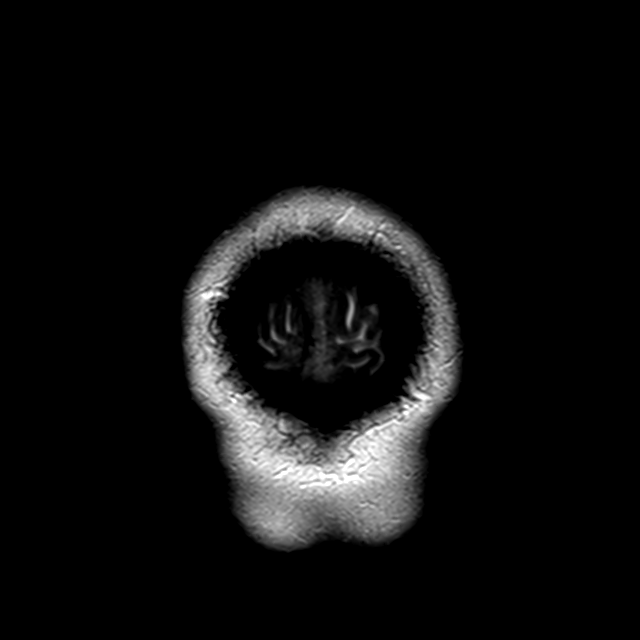

[34 of 48 positions shown; findings below may reference images not displayed]

FINDINGS: Brain: No acute infarction, hemorrhage, hydrocephalus, extra-axial
collection or mass lesion. Normal white matter.

Vascular: Normal arterial flow voids.

Skull and upper cervical spine: Negative

Sinuses/Orbits: Mild mucosal edema paranasal sinuses.  Normal orbit

Other: None
IMPRESSION: Normal MRI brain.

ADDENDUM:
After further review and discussion with Dr. Tegos, there is a small
hyperintensity in the right corona radiata on diffusion weighted
imaging. This measures approximately 4 x 6 mm and may represent
acute or subacute infarction.

*** End of Addendum ***
FINDINGS: Brain: No acute infarction, hemorrhage, hydrocephalus, extra-axial
collection or mass lesion. Normal white matter.

Vascular: Normal arterial flow voids.

Skull and upper cervical spine: Negative

Sinuses/Orbits: Mild mucosal edema paranasal sinuses.  Normal orbit

Other: None
IMPRESSION: Normal MRI brain.

## 2019-10-26 ENCOUNTER — Other Ambulatory Visit: Payer: Self-pay | Admitting: Family Medicine

## 2019-10-26 MED FILL — ROSUVASTATIN CALCIUM 40 MG: 40 | 90 days supply | Qty: 90 | Fill #0

## 2019-11-13 MED FILL — PRALUENT 150 MG/ML SOAJ: 150 | 28 days supply | Qty: 2 | Fill #8

## 2019-11-20 MED FILL — CARVEDILOL 3.125 MG TABLET: 3.125 | 90 days supply | Qty: 180 | Fill #3

## 2019-11-20 MED FILL — CLOPIDOGREL 75 MG TABLET: 75 | 90 days supply | Qty: 90 | Fill #3

## 2019-12-03 MED FILL — ROSUVASTATIN CALCIUM 40 MG: 40 | 90 days supply | Qty: 90 | Fill #1

## 2020-01-11 ENCOUNTER — Other Ambulatory Visit: Payer: Self-pay | Admitting: Internal Medicine

## 2020-01-13 ENCOUNTER — Other Ambulatory Visit (HOSPITAL_COMMUNITY): Payer: Self-pay | Admitting: Internal Medicine

## 2020-01-13 MED FILL — PRALUENT 150 MG/ML SOAJ: 150 | 28 days supply | Qty: 2 | Fill #0

## 2020-01-22 ENCOUNTER — Encounter: Payer: Self-pay | Admitting: Nurse Practitioner

## 2020-01-22 ENCOUNTER — Ambulatory Visit (INDEPENDENT_AMBULATORY_CARE_PROVIDER_SITE_OTHER): Payer: No Typology Code available for payment source | Admitting: Nurse Practitioner

## 2020-01-22 ENCOUNTER — Other Ambulatory Visit: Payer: Self-pay

## 2020-01-22 VITALS — BP 138/80 | HR 84 | Temp 96.7°F | Ht 71.0 in | Wt 219.0 lb

## 2020-01-22 DIAGNOSIS — Z125 Encounter for screening for malignant neoplasm of prostate: Secondary | ICD-10-CM

## 2020-01-22 DIAGNOSIS — M791 Myalgia, unspecified site: Secondary | ICD-10-CM | POA: Diagnosis not present

## 2020-01-22 DIAGNOSIS — M25562 Pain in left knee: Secondary | ICD-10-CM | POA: Diagnosis not present

## 2020-01-22 DIAGNOSIS — Z1211 Encounter for screening for malignant neoplasm of colon: Secondary | ICD-10-CM | POA: Diagnosis not present

## 2020-01-22 DIAGNOSIS — Z0001 Encounter for general adult medical examination with abnormal findings: Secondary | ICD-10-CM

## 2020-01-22 LAB — IFOBT (OCCULT BLOOD): IFOBT: POSITIVE

## 2020-01-22 NOTE — Patient Instructions (Addendum)
Have blood draw completed by cardiologist's office.  Go to lab for iFOB stool kit  You will be contacted to schedule an appt with sports medicine  Thank you for choosing [facility] for your health needs  Preventive Care 6-51 Years Old, Male Preventive care refers to lifestyle choices and visits with your health care provider that can promote health and wellness. This includes:  A yearly physical exam. This is also called an annual well check.  Regular dental and eye exams.  Immunizations.  Screening for certain conditions.  Healthy lifestyle choices, such as eating a healthy diet, getting regular exercise, not using drugs or products that contain nicotine and tobacco, and limiting alcohol use. What can I expect for my preventive care visit? Physical exam Your health care provider will check:  Height and weight. These may be used to calculate body mass index (BMI), which is a measurement that tells if you are at a healthy weight.  Heart rate and blood pressure.  Your skin for abnormal spots. Counseling Your health care provider may ask you questions about:  Alcohol, tobacco, and drug use.  Emotional well-being.  Home and relationship well-being.  Sexual activity.  Eating habits.  Work and work Statistician. What immunizations do I need?  Influenza (flu) vaccine  This is recommended every year. Tetanus, diphtheria, and pertussis (Tdap) vaccine  You may need a Td booster every 10 years. Varicella (chickenpox) vaccine  You may need this vaccine if you have not already been vaccinated. Zoster (shingles) vaccine  You may need this after age 76. Measles, mumps, and rubella (MMR) vaccine  You may need at least one dose of MMR if you were born in 1957 or later. You may also need a second dose. Pneumococcal conjugate (PCV13) vaccine  You may need this if you have certain conditions and were not previously vaccinated. Pneumococcal polysaccharide (PPSV23)  vaccine  You may need one or two doses if you smoke cigarettes or if you have certain conditions. Meningococcal conjugate (MenACWY) vaccine  You may need this if you have certain conditions. Hepatitis A vaccine  You may need this if you have certain conditions or if you travel or work in places where you may be exposed to hepatitis A. Hepatitis B vaccine  You may need this if you have certain conditions or if you travel or work in places where you may be exposed to hepatitis B. Haemophilus influenzae type b (Hib) vaccine  You may need this if you have certain risk factors. Human papillomavirus (HPV) vaccine  If recommended by your health care provider, you may need three doses over 6 months. You may receive vaccines as individual doses or as more than one vaccine together in one shot (combination vaccines). Talk with your health care provider about the risks and benefits of combination vaccines. What tests do I need? Blood tests  Lipid and cholesterol levels. These may be checked every 5 years, or more frequently if you are over 34 years old.  Hepatitis C test.  Hepatitis B test. Screening  Lung cancer screening. You may have this screening every year starting at age 55 if you have a 30-pack-year history of smoking and currently smoke or have quit within the past 15 years.  Prostate cancer screening. Recommendations will vary depending on your family history and other risks.  Colorectal cancer screening. All adults should have this screening starting at age 21 and continuing until age 31. Your health care provider may recommend screening at age 74  if you are at increased risk. You will have tests every 1-10 years, depending on your results and the type of screening test.  Diabetes screening. This is done by checking your blood sugar (glucose) after you have not eaten for a while (fasting). You may have this done every 1-3 years.  Sexually transmitted disease (STD)  testing. Follow these instructions at home: Eating and drinking  Eat a diet that includes fresh fruits and vegetables, whole grains, lean protein, and low-fat dairy products.  Take vitamin and mineral supplements as recommended by your health care provider.  Do not drink alcohol if your health care provider tells you not to drink.  If you drink alcohol: ? Limit how much you have to 0-2 drinks a day. ? Be aware of how much alcohol is in your drink. In the [place] one drink equals one 12 oz bottle of beer (355 mL), one 5 oz glass of wine (148 mL), or one 1 oz glass of hard liquor (44 mL). Lifestyle  Take daily care of your teeth and gums.  Stay active. Exercise for at least 30 minutes on 5 or more days each week.  Do not use any products that contain nicotine or tobacco, such as cigarettes, e-cigarettes, and chewing tobacco. If you need help quitting, ask your health care provider.  If you are sexually active, practice safe sex. Use a condom or other form of protection to prevent STIs (sexually transmitted infections).  Talk with your health care provider about taking a low-dose aspirin every day starting at age 19. What's next?  Go to your health care provider once a year for a well check visit.  Ask your health care provider how often you should have your eyes and teeth checked.  Stay up to date on all vaccines. This information is not intended to replace advice given to you by your health care provider. Make sure you discuss any questions you have with your health care provider. Document Revised: 02/13/2018 Document Reviewed: 02/13/2018 Elsevier Patient Education  2020 [employer].

## 2020-01-22 NOTE — Progress Notes (Signed)
Subjective:    Patient ID: Brad Holmes, male    DOB: 09-Jun-1970, 49 y.o.   MRN: [id number]  Patient presents today for CPE and eval of chronic conditions  Knee Pain  Incident onset: chronic, over 62month. The injury mechanism is unknown. The pain is present in the left knee. The quality of the pain is described as aching. The pain is mild. The pain has been constant since onset. Pertinent negatives include no inability to bear weight, loss of motion, loss of sensation, muscle weakness, numbness or tingling. He reports no foreign bodies present. The symptoms are aggravated by palpation. He has tried nothing for the symptoms.   Myalgia: Onset 272monthago, worsening, worse in PM with increase activity, associated with AM joint stiffness which last >3060m, no improvement with tylenol. No NSAID due to chronic plavix and ASA use. no joint swelling or redness, no rash. No FHx of any autoimmune disorder.  Sexual History (orientation,birth control, marital status, STD):marriedually active, denies need for STD screen agreed to rectal exam today Depression/Suicide: [facility]/22[facility] 11/20/2018 08/11/2018  Decreased Interest 0 0 0  Down, Depressed, Hopeless 0 0 0  PHQ - 2 Score 0 0 0   Vision:up to date  Dental:up to date  Immunizations: (TDAP, Hep C screen, Pneumovax, Influenza, zoster)  Health Maintenance  Topic Date Due  .  Hepatitis C: One time screening is recommended by Center for Disease Control  (CDC) for  adults born from [birth year]rough 1965.   Never done  . Tetanus Vaccine  Never done  . Colon Cancer Screening  Never done  . COVID-19 Vaccine (2 - Pfizer 2-dose series) 06/18/2019  . Flu Shot  10/04/2019  . HIV Screening  Completed   Diet:regular.  Weight:  Wt Readings from Last 3 Encounters:  01/22/20 219 lb (99.3 kg)  08/10/19 209 lb (94.8 kg)  02/17/19 215 lb (97.5 kg)    Fall Risk: Fall Risk  01/22/2020 11/20/2018 08/11/2018  Falls in the past year? 0 0 1    Comment - - dec  Number falls in past yr: 0 - 0  Injury with Fall? 0 - 0  Follow up - - Falls evaluation completed   Advanced Directive: Advanced Directives 11/14/2018  Does Patient Have a Medical Advance Directive? No  Would patient like information on creating a medical advance directive? No - Patient declined    Medications and allergies reviewed with patient and updated if appropriate.  Patient Active Problem List   Diagnosis Date Noted  . TIA (transient ischemic attack) 11/14/2018  . Abnormal transaminases 08/12/2018  . Family history of early CAD 08/11/2018  . Hypertension 08/11/2018  . Hypercholesteremia 08/11/2018  . Screening for HIV without presence of risk factors 08/11/2018  . Colon cancer screening 08/11/2018  . Chronic arthralgias of knees and hips 10/09/2016    Current Outpatient Medications on File Prior to Visit  Medication Sig Dispense Refill  . aspirin 325 MG EC tablet Take 1 tablet (325 mg total) by mouth daily. 30 tablet 0  . clopidogrel (PLAVIX) 75 MG tablet TAKE 1 TABLET (75 MG TOTAL) BY MOUTH DAILY. 90 tablet 3  . PRALUENT 150 MG/ML SOAJ INJECT 1 DOSE INTO THE SKIN EVERY 14 DAYS AS DIRECTED 2 mL 11  . rosuvastatin (CRESTOR) 40 MG tablet TAKE 1 TABLET BY MOUTH DAILY. 90 tablet 3  . carvedilol (COREG) 3.125 MG tablet Take 1 tablet (3.125 mg total) by mouth 2 (two) times daily. 180 tablet 3  No current facility-administered medications on file prior to visit.    Past Medical History:  Diagnosis Date  . Hypercholesterolemia   . Hypertension   . Stroke ([place])   . Transient ischemic attack     Past Surgical History:  Procedure Laterality Date  . CARDIAC CATHETERIZATION     05/12/09  . EYE SURGERY    . IR 3D INDEPENDENT WKST  11/24/2018  . IR ANGIO INTRA EXTRACRAN SEL COM CAROTID INNOMINATE BILAT MOD SED  11/14/2018  . IR ANGIO INTRA EXTRACRAN SEL INTERNAL CAROTID UNI R MOD SED  11/24/2018  . IR ANGIO VERTEBRAL SEL SUBCLAVIAN INNOMINATE UNI R MOD SED   11/24/2018  . IR ANGIO VERTEBRAL SEL VERTEBRAL BILAT MOD SED  11/14/2018  . IR NEURO EACH ADD'L AFTER BASIC UNI RIGHT (MS)  11/24/2018  . RADIOLOGY WITH ANESTHESIA N/A 11/24/2018   Procedure: RADIOLOGY WITH ANESTHESIA  STENTING;  Surgeon: Luanne Bras, MD;  Location: [facility];  Service: Radiology;  Laterality: N/A;  . WISDOM TOOTH EXTRACTION      Social History   Socioeconomic History  . Marital status: Married    Spouse name: Not on file  . Number of children: Not on file  . Years of education: Not on file  . Highest education level: Not on file  Occupational History  . Not on file  Tobacco Use  . Smoking status: Never Smoker  . Smokeless tobacco: Never Used  Vaping Use  . Vaping Use: Never used  Substance and Sexual Activity  . Alcohol use: Yes    Comment: rare  . Drug use: No  . Sexual activity: Not on file  Other Topics Concern  . Not on file  Social History Narrative  . Not on file   Social Determinants of Health   Financial Resource Strain:   . Difficulty of Paying Living Expenses: Not on file  Food Insecurity:   . Worried About Charity fundraiser in the Last Year: Not on file  . Ran Out of Food in the Last Year: Not on file  Transportation Needs:   . Lack of Transportation (Medical): Not on file  . Lack of Transportation (Non-Medical): Not on file  Physical Activity:   . Days of Exercise per Week: Not on file  . Minutes of Exercise per Session: Not on file  Stress:   . Feeling of Stress : Not on file  Social Connections:   . Frequency of Communication with Friends and Family: Not on file  . Frequency of Social Gatherings with Friends and Family: Not on file  . Attends Religious Services: Not on file  . Active Member of Clubs or Organizations: Not on file  . Attends Archivist Meetings: Not on file  . Marital Status: Not on file   Family History  Problem Relation Age of Onset  . Hyperlipidemia Mother   . Heart disease Mother 26  . Heart  failure Mother 37  . Colon cancer Neg Hx   . Colon polyps Neg Hx   . Esophageal cancer Neg Hx   . Rectal cancer Neg Hx   . Stomach cancer Neg Hx        Review of Systems  Constitutional: Positive for malaise/fatigue. Negative for chills, fever and weight loss.  HENT: Negative for congestion and sore throat.   Eyes:       Negative for visual changes  Respiratory: Negative for cough and shortness of breath.   Cardiovascular: Negative for chest pain, palpitations and  leg swelling.  Gastrointestinal: Negative for abdominal pain, blood in stool, constipation, diarrhea, heartburn, melena, nausea and vomiting.  Genitourinary: Negative for dysuria, frequency and urgency.  Musculoskeletal: Positive for joint pain and myalgias. Negative for falls.  Skin: Negative for rash.  Neurological: Negative for dizziness, tingling, sensory change, numbness and headaches.  Endo/Heme/Allergies: Does not bruise/bleed easily.  Psychiatric/Behavioral: Negative for depression, substance abuse and suicidal ideas. The patient is not nervous/anxious.     Objective:   Vitals:   01/22/20 1013  BP: 138/80  Pulse: 84  Temp: (!) 96.7 F (35.9 C)  SpO2: 99%   Body mass index is 30.54 kg/m.  Physical Examination:  Physical Exam Vitals and nursing note reviewed. Exam conducted with a chaperone present.  Constitutional:      General: He is not in acute distress.    Appearance: He is well-developed. He is obese.  HENT:     Right Ear: Tympanic membrane, ear canal and external ear normal.     Left Ear: Tympanic membrane, ear canal and external ear normal.  Eyes:     Extraocular Movements: Extraocular movements intact.     Conjunctiva/sclera: Conjunctivae normal.  Cardiovascular:     Rate and Rhythm: Normal rate and regular rhythm.     Pulses: Normal pulses.     Heart sounds: Normal heart sounds.  Pulmonary:     Effort: Pulmonary effort is normal. No respiratory distress.     Breath sounds: Normal  breath sounds.  Chest:     Chest wall: No tenderness.  Abdominal:     General: Bowel sounds are normal.     Palpations: Abdomen is soft.  Genitourinary:    Prostate: Normal.     Rectum: Normal. Guaiac result positive.  Musculoskeletal:        General: Tenderness present. No swelling. Normal range of motion.     Cervical back: Normal range of motion and neck supple.     Right lower leg: No edema.     Left lower leg: No edema.  Lymphadenopathy:     Cervical: No cervical adenopathy.  Skin:    General: Skin is warm and dry.     Findings: No rash.  Neurological:     Mental Status: He is alert and oriented to person, place, and time.     Deep Tendon Reflexes: Reflexes are normal and symmetric.  Psychiatric:        Mood and Affect: Mood normal.        Behavior: Behavior normal.        Thought Content: Thought content normal.     ASSESSMENT and PLAN: This visit occurred during the SARS-CoV-2 public health emergency.  Safety protocols were in place, including screening questions prior to the visit, additional usage of staff PPE, and extensive cleaning of exam room while observing appropriate contact time as indicated for disinfecting solutions.   Melquan was seen today for establish care.  Diagnoses and all orders for this visit:  Encounter for preventative adult health care exam with abnormal findings -     CBC with Differential/Platelet; Future -     Comprehensive metabolic panel; Future  Myalgia -     CK (Creatine Kinase); Future -     TSH; Future -     Antinuclear Antib (ANA); Future -     Sedimentation rate; Future -     Rheumatoid Factor; Future  Medial knee pain, left -     Ambulatory referral to Sports Medicine  Encounter for screening fecal  occult blood testing -     IFOBT POC (occult bld, rslt in office) -     Fecal occult blood, imunochemical; Standing  Prostate cancer screening -     PSA  Order cologuard if negative ifob x 2 Maintain DASH diet and regular  exercise If normal ESR, ANA, TSH, and Rh-factor: collaborate with cardiology to consider reduction of crestor to 3x/week     Problem List Items Addressed This Visit      Other   Colon cancer screening    Other Visit Diagnoses    Encounter for preventative adult health care exam with abnormal findings    -  Primary   Relevant Orders   CBC with Differential/Platelet   Comprehensive metabolic panel   Myalgia       Relevant Orders   CK (Creatine Kinase)   TSH   Antinuclear Antib (ANA)   Sedimentation rate   Rheumatoid Factor   Medial knee pain, left       Relevant Orders   Ambulatory referral to Sports Medicine   Prostate cancer screening       Relevant Orders   PSA      Follow up: Return in about 1 year (around 01/21/2021) for CPE (fasting).  Wilfred Lacy, NP

## 2020-02-05 ENCOUNTER — Ambulatory Visit (INDEPENDENT_AMBULATORY_CARE_PROVIDER_SITE_OTHER): Payer: No Typology Code available for payment source | Admitting: Family Medicine

## 2020-02-05 ENCOUNTER — Other Ambulatory Visit: Payer: Self-pay

## 2020-02-05 ENCOUNTER — Encounter: Payer: Self-pay | Admitting: Family Medicine

## 2020-02-05 VITALS — BP 143/79 | HR 87 | Ht 71.0 in | Wt 213.0 lb

## 2020-02-05 DIAGNOSIS — M7501 Adhesive capsulitis of right shoulder: Secondary | ICD-10-CM | POA: Insufficient documentation

## 2020-02-05 NOTE — Assessment & Plan Note (Signed)
Symptoms seem most consistent with frozen shoulder.  His range of motion is limited.  He has good strength with rotator cuff testing. -Counseled on home exercise therapy and supportive care. -Pennsaid samples. -Could consider injection or physical therapy or imaging.

## 2020-02-05 NOTE — Progress Notes (Signed)
Medication Samples have been provided to the patient.  Drug name: Pennsaid       Strength: 2%        Qty: 2 boxes  LOT: [id number]  Exp.Date: 07/2020  Dosing instructions: Use a pea size amount and rub gently.  The patient has been instructed regarding the correct time, dose, and frequency of taking this medication, including desired effects and most common side effects.   Sherrie George, MA 8:56 AM 02/05/2020

## 2020-02-05 NOTE — Patient Instructions (Signed)
Nice to meet you Please try heat before exercise and ice after  Please try the exercises  Please try the pennsaid   Please send me a message in [place] with any questions or updates.  Please see me back in 4 weeks.   --Dr. Raeford Razor

## 2020-02-05 NOTE — Progress Notes (Signed)
Brad Holmes - 49 y.o. male MRN [id number]  Date of birth: [birth year]  SUBJECTIVE:  Including CC & ROS.  Chief Complaint  Patient presents with  . Shoulder Pain    bilateral / right worse    Brad Holmes is a 49 y.o. male that is presenting with acute on chronic right shoulder pain.  Pain has been ongoing for about 6 months.  He first noticed the pain after he was reaching up in a crawl space.  Since that time he has had stiffness.  He has trouble with swinging a tennis racquet and a golf club.  No prior history of similar pain.  No history of surgery.   Review of Systems See HPI   HISTORY: Past Medical, Surgical, Social, and Family History Reviewed & Updated per EMR.   Pertinent Historical Findings include:  Past Medical History:  Diagnosis Date  . Hypercholesterolemia   . Hypertension   . Stroke ([place])   . Transient ischemic attack     Past Surgical History:  Procedure Laterality Date  . CARDIAC CATHETERIZATION     05/12/09  . EYE SURGERY    . IR 3D INDEPENDENT WKST  11/24/2018  . IR ANGIO INTRA EXTRACRAN SEL COM CAROTID INNOMINATE BILAT MOD SED  11/14/2018  . IR ANGIO INTRA EXTRACRAN SEL INTERNAL CAROTID UNI R MOD SED  11/24/2018  . IR ANGIO VERTEBRAL SEL SUBCLAVIAN INNOMINATE UNI R MOD SED  11/24/2018  . IR ANGIO VERTEBRAL SEL VERTEBRAL BILAT MOD SED  11/14/2018  . IR NEURO EACH ADD'L AFTER BASIC UNI RIGHT (MS)  11/24/2018  . RADIOLOGY WITH ANESTHESIA N/A 11/24/2018   Procedure: RADIOLOGY WITH ANESTHESIA  STENTING;  Surgeon: Luanne Bras, MD;  Location: [facility];  Service: Radiology;  Laterality: N/A;  . WISDOM TOOTH EXTRACTION      Family History  Problem Relation Age of Onset  . Hyperlipidemia Mother   . Heart disease Mother 60  . Heart failure Mother 55  . Colon cancer Neg Hx   . Colon polyps Neg Hx   . Esophageal cancer Neg Hx   . Rectal cancer Neg Hx   . Stomach cancer Neg Hx     Social History   Socioeconomic History  . Marital status: Married    Spouse  name: Not on file  . Number of children: Not on file  . Years of education: Not on file  . Highest education level: Not on file  Occupational History  . Not on file  Tobacco Use  . Smoking status: Never Smoker  . Smokeless tobacco: Never Used  Vaping Use  . Vaping Use: Never used  Substance and Sexual Activity  . Alcohol use: Yes    Comment: rare  . Drug use: No  . Sexual activity: Not on file  Other Topics Concern  . Not on file  Social History Narrative  . Not on file   Social Determinants of Health   Financial Resource Strain:   . Difficulty of Paying Living Expenses: Not on file  Food Insecurity:   . Worried About Charity fundraiser in the Last Year: Not on file  . Ran Out of Food in the Last Year: Not on file  Transportation Needs:   . Lack of Transportation (Medical): Not on file  . Lack of Transportation (Non-Medical): Not on file  Physical Activity:   . Days of Exercise per Week: Not on file  . Minutes of Exercise per Session: Not on file  Stress:   .  Feeling of Stress : Not on file  Social Connections:   . Frequency of Communication with Friends and Family: Not on file  . Frequency of Social Gatherings with Friends and Family: Not on file  . Attends Religious Services: Not on file  . Active Member of Clubs or Organizations: Not on file  . Attends Archivist Meetings: Not on file  . Marital Status: Not on file  Intimate Partner Violence:   . Fear of Current or Ex-Partner: Not on file  . Emotionally Abused: Not on file  . Physically Abused: Not on file  . Sexually Abused: Not on file     PHYSICAL EXAM:  VS: BP (!) 143/79   Pulse 87   Ht 5\' 11"  (1.803 m)   Wt 213 lb (96.6 kg)   BMI 29.71 kg/m  Physical Exam Gen: NAD, alert, cooperative with exam, well-appearing MSK:  Shoulder: Limited external rotation. Limited flexion and abduction. Normal strength resistance. Neurovascularly intact     ASSESSMENT & PLAN:   Adhesive capsulitis  of right shoulder Symptoms seem most consistent with frozen shoulder.  His range of motion is limited.  He has good strength with rotator cuff testing. -Counseled on home exercise therapy and supportive care. -Pennsaid samples. -Could consider injection or physical therapy or imaging.

## 2020-02-29 MED FILL — PRALUENT 150 MG/ML SOAJ: 150 | 28 days supply | Qty: 2 | Fill #1

## 2020-03-07 ENCOUNTER — Ambulatory Visit: Payer: No Typology Code available for payment source | Admitting: Family Medicine

## 2020-03-07 NOTE — Progress Notes (Deleted)
  Brad Holmes - 50 y.o. male MRN [id number]  Date of birth: 1970/11/27  SUBJECTIVE:  Including CC & ROS.  No chief complaint on file.   Brad Holmes is a 50 y.o. male that is  ***.  ***   Review of Systems See HPI   HISTORY: Past Medical, Surgical, Social, and Family History Reviewed & Updated per EMR.   Pertinent Historical Findings include:  Past Medical History:  Diagnosis Date  . Hypercholesterolemia   . Hypertension   . Stroke ([facility])   . Transient ischemic attack     Past Surgical History:  Procedure Laterality Date  . CARDIAC CATHETERIZATION     05/12/09  . EYE SURGERY    . IR 3D INDEPENDENT WKST  11/24/2018  . IR ANGIO INTRA EXTRACRAN SEL COM CAROTID INNOMINATE BILAT MOD SED  11/14/2018  . IR ANGIO INTRA EXTRACRAN SEL INTERNAL CAROTID UNI R MOD SED  11/24/2018  . IR ANGIO VERTEBRAL SEL SUBCLAVIAN INNOMINATE UNI R MOD SED  11/24/2018  . IR ANGIO VERTEBRAL SEL VERTEBRAL BILAT MOD SED  11/14/2018  . IR NEURO EACH ADD'L AFTER BASIC UNI RIGHT (MS)  11/24/2018  . RADIOLOGY WITH ANESTHESIA N/A 11/24/2018   Procedure: RADIOLOGY WITH ANESTHESIA  STENTING;  Surgeon: Julieanne Cotton, MD;  Location: [facility];  Service: Radiology;  Laterality: N/A;  . WISDOM TOOTH EXTRACTION      Family History  Problem Relation Age of Onset  . Hyperlipidemia Mother   . Heart disease Mother 75  . Heart failure Mother 67  . Colon cancer Neg Hx   . Colon polyps Neg Hx   . Esophageal cancer Neg Hx   . Rectal cancer Neg Hx   . Stomach cancer Neg Hx     Social History   Socioeconomic History  . Marital status: Married    Spouse name: Not on file  . Number of children: Not on file  . Years of education: Not on file  . Highest education level: Not on file  Occupational History  . Not on file  Tobacco Use  . Smoking status: Never Smoker  . Smokeless tobacco: Never Used  Vaping Use  . Vaping Use: Never used  Substance and Sexual Activity  . Alcohol use: Yes    Comment: rare  . Drug use:  No  . Sexual activity: Not on file  Other Topics Concern  . Not on file  Social History Narrative  . Not on file   Social Determinants of Health   Financial Resource Strain: Not on file  Food Insecurity: Not on file  Transportation Needs: Not on file  Physical Activity: Not on file  Stress: Not on file  Social Connections: Not on file  Intimate Partner Violence: Not on file     PHYSICAL EXAM:  VS: There were no vitals taken for this visit. Physical Exam Gen: NAD, alert, cooperative with exam, well-appearing MSK:  ***      ASSESSMENT & PLAN:   No problem-specific Assessment & Plan notes found for this encounter.

## 2020-03-08 ENCOUNTER — Other Ambulatory Visit (HOSPITAL_COMMUNITY): Payer: Self-pay | Admitting: Sports Medicine

## 2020-03-08 MED FILL — NABUMETONE 500 MG TABS: 500 | 30 days supply | Qty: 60 | Fill #0

## 2020-03-18 ENCOUNTER — Other Ambulatory Visit: Payer: Self-pay | Admitting: Nurse Practitioner

## 2020-03-18 ENCOUNTER — Other Ambulatory Visit: Payer: Self-pay | Admitting: Family Medicine

## 2020-03-18 MED FILL — CLOPIDOGREL 75 MG TABLET: 75 | 90 days supply | Qty: 90 | Fill #0

## 2020-04-04 MED FILL — ROSUVASTATIN CALCIUM 40 MG: 40 | 90 days supply | Qty: 90 | Fill #1

## 2020-04-07 ENCOUNTER — Other Ambulatory Visit (HOSPITAL_COMMUNITY): Payer: Self-pay | Admitting: Interventional Radiology

## 2020-04-07 ENCOUNTER — Telehealth (HOSPITAL_COMMUNITY): Payer: Self-pay

## 2020-04-07 DIAGNOSIS — I63511 Cerebral infarction due to unspecified occlusion or stenosis of right middle cerebral artery: Secondary | ICD-10-CM

## 2020-04-07 NOTE — Telephone Encounter (Signed)
Called to schedule cta head/neck, no answer, left vm. AW 

## 2020-05-19 ENCOUNTER — Other Ambulatory Visit: Payer: Self-pay | Admitting: Internal Medicine

## 2020-05-19 ENCOUNTER — Other Ambulatory Visit (HOSPITAL_COMMUNITY): Payer: Self-pay | Admitting: Internal Medicine

## 2020-06-07 ENCOUNTER — Other Ambulatory Visit: Payer: Self-pay | Admitting: Nurse Practitioner

## 2020-06-07 ENCOUNTER — Other Ambulatory Visit (HOSPITAL_COMMUNITY): Payer: Self-pay

## 2020-06-07 ENCOUNTER — Other Ambulatory Visit: Payer: Self-pay | Admitting: Internal Medicine

## 2020-06-07 MED ORDER — CARVEDILOL 3.125 MG PO TABS
ORAL_TABLET | Freq: Two times a day (BID) | ORAL | 0 refills | Status: DC
  Filled 2020-06-07: qty 60, 30d supply, fill #0
  Filled 2020-06-07: qty 90, fill #0
  Filled 2020-06-13: qty 60, 30d supply, fill #0
  Filled 2020-11-30: qty 30, 15d supply, fill #1

## 2020-06-07 MED ORDER — CLOPIDOGREL BISULFATE 75 MG PO TABS
ORAL_TABLET | Freq: Every day | ORAL | 0 refills | Status: AC
  Filled 2020-06-07: qty 90, 90d supply, fill #0

## 2020-06-07 MED FILL — Alirocumab Subcutaneous Solution Auto-Injector 150 MG/ML: SUBCUTANEOUS | 28 days supply | Qty: 2 | Fill #0 | Status: CN

## 2020-06-10 ENCOUNTER — Other Ambulatory Visit (HOSPITAL_COMMUNITY): Payer: Self-pay

## 2020-06-10 MED FILL — Alirocumab Subcutaneous Solution Auto-Injector 150 MG/ML: SUBCUTANEOUS | 28 days supply | Qty: 2 | Fill #0 | Status: AC

## 2020-06-13 ENCOUNTER — Other Ambulatory Visit (HOSPITAL_COMMUNITY): Payer: Self-pay

## 2020-09-12 ENCOUNTER — Other Ambulatory Visit (HOSPITAL_COMMUNITY): Payer: Self-pay

## 2020-09-12 MED FILL — Alirocumab Subcutaneous Solution Auto-Injector 150 MG/ML: SUBCUTANEOUS | 28 days supply | Qty: 2 | Fill #1 | Status: CN

## 2020-09-19 ENCOUNTER — Other Ambulatory Visit (HOSPITAL_COMMUNITY): Payer: Self-pay

## 2020-10-06 ENCOUNTER — Other Ambulatory Visit (HOSPITAL_COMMUNITY): Payer: Self-pay

## 2020-10-06 MED FILL — Alirocumab Subcutaneous Solution Auto-Injector 150 MG/ML: SUBCUTANEOUS | 28 days supply | Qty: 2 | Fill #1 | Status: AC

## 2020-10-17 ENCOUNTER — Telehealth: Payer: No Typology Code available for payment source | Admitting: Nurse Practitioner

## 2020-10-17 DIAGNOSIS — H02846 Edema of left eye, unspecified eyelid: Secondary | ICD-10-CM | POA: Diagnosis not present

## 2020-10-17 DIAGNOSIS — U071 COVID-19: Secondary | ICD-10-CM

## 2020-10-17 MED ORDER — DOXYCYCLINE HYCLATE 100 MG PO TABS
100.0000 mg | ORAL_TABLET | Freq: Two times a day (BID) | ORAL | 0 refills | Status: DC
Start: 2020-10-17 — End: 2020-12-15
  Filled 2020-10-17: qty 20, 10d supply, fill #0

## 2020-10-17 MED ORDER — BENZONATATE 100 MG PO CAPS
100.0000 mg | ORAL_CAPSULE | Freq: Three times a day (TID) | ORAL | 0 refills | Status: DC | PRN
  Filled 2020-10-17: qty 20, 7d supply, fill #0

## 2020-10-17 MED ORDER — MOLNUPIRAVIR EUA 200MG CAPSULE
4.0000 | ORAL_CAPSULE | Freq: Two times a day (BID) | ORAL | 0 refills | Status: AC
  Filled 2020-10-17: qty 40, 5d supply, fill #0

## 2020-10-17 NOTE — Progress Notes (Signed)
Virtual Visit Consent   Brad Holmes, you are scheduled for a virtual visit with Brad Holmes, [place], a [facility] provider, today.     Just as with appointments in the office, your consent must be obtained to participate.  Your consent will be active for this visit and any virtual visit you may have with one of our providers in the next 365 days.     If you have a MyChart account, a copy of this consent can be sent to you electronically.  All virtual visits are billed to your insurance company just like a traditional visit in the office.    As this is a virtual visit, video technology does not allow for your provider to perform a traditional examination.  This may limit your provider's ability to fully assess your condition.  If your provider identifies any concerns that need to be evaluated in person or the need to arrange testing (such as labs, EKG, etc.), we will make arrangements to do so.     Although advances in technology are sophisticated, we cannot ensure that it will always work on either your end or our end.  If the connection with a video visit is poor, the visit may have to be switched to a telephone visit.  With either a video or telephone visit, we are not always able to ensure that we have a secure connection.     I need to obtain your verbal consent now.   Are you willing to proceed with your visit today? YES   Brad Holmes has provided verbal consent on 10/17/2020 for a virtual visit (video or telephone).   Brad Hassell Done, FNP   Date: 10/17/2020 6:00 PM   Virtual Visit via Video Note   I, Brad Holmes, connected with Brad Holmes ([id number], 1970-12-27) on 10/17/20 at  6:15 PM EDT by a video-enabled telemedicine application and verified that I am speaking with the correct person using two identifiers.  Location: Patient: Virtual Visit Location Patient: Home Provider: Virtual Visit Location Provider: Mobile   I discussed the limitations of  evaluation and management by telemedicine and the availability of in person appointments. The patient expressed understanding and agreed to proceed.    History of Present Illness: Brad Holmes is a 50 y.o. who identifies as a male who was assigned male at birth, and is being seen today for covid positive.  HPI: Patient said that he started having sore throat , headache and faciial pressure on Thursday. That progrssed to fatigue. His biggest problem is swelling and pain of left lower lid. Problems:  Patient Active Problem List   Diagnosis Date Noted   Adhesive capsulitis of right shoulder 02/05/2020   TIA (transient ischemic attack) 11/14/2018   Abnormal transaminases 08/12/2018   Family history of early CAD 08/11/2018   Hypertension 08/11/2018   Hypercholesteremia 08/11/2018   Screening for HIV without presence of risk factors 08/11/2018   Colon cancer screening 08/11/2018   Chronic arthralgias of knees and hips 10/09/2016    Allergies:  Allergies  Allergen Reactions   No Known Allergies    Medications:  Current Outpatient Medications:    aspirin 325 MG EC tablet, Take 1 tablet (325 mg total) by mouth daily., Disp: 30 tablet, Rfl: 0   carvedilol (COREG) 3.125 MG tablet, TAKE 1 TABLET (3.125 MG TOTAL) BY MOUTH 2 (TWO) TIMES DAILY., Disp: 60 tablet, Rfl: 0   carvedilol (COREG) 3.125 MG tablet, TAKE 1 TABLET (3.125 MG TOTAL) BY  MOUTH 2 (TWO) TIMES DAILY., Disp: 90 tablet, Rfl: 0   clopidogrel (PLAVIX) 75 MG tablet, TAKE 1 TABLET (75 MG TOTAL) BY MOUTH DAILY., Disp: 90 tablet, Rfl: 0   nabumetone (RELAFEN) 500 MG tablet, TAKE 1 TABLET BY MOUTH TWICE A DAY AS NEEDED FOR PAIN WITH FOOD, Disp: 60 tablet, Rfl: 3   PRALUENT 150 MG/ML SOAJ, INJECT 1 DOSE INTO THE SKIN EVERY 14 DAYS AS DIRECTED, Disp: 2 mL, Rfl: 11   PRALUENT 150 MG/ML SOAJ, INJECT 1 DOSE INTO THE SKIN EVERY 14 DAYS AS DIRECTED, Disp: 2 mL, Rfl: 9   rosuvastatin (CRESTOR) 40 MG tablet, TAKE 1 TABLET BY MOUTH DAILY., Disp: 90  tablet, Rfl: 3  Observations/Objective: Patient is well-developed, well-nourished in no acute distress.  Resting comfortably  at home.  Head is normocephalic, atraumatic.  No labored breathing.  Speech is clear and coherent with logical content.  Patient is alert and oriented at baseline.  Left lower eyelid edematous and tender to touch on outer canthus.no drainge ,no erythema  Assessment and Plan: Brad Holmes in today with chief complaint of Covid Positive  1. Edema of left eyelid Possible infection Told if no better by tomorrow needs in person visit for possible IV anttibiotics. - doxycycline (VIBRA-TABS) 100 MG tablet; Take 1 tablet (100 mg total) by mouth 2 (two) times daily. 1 po bid  Dispense: 20 tablet; Refill: 0  2. COVID 1. Take meds as prescribed 2. Use a cool mist humidifier especially during the winter months and when heat has been humid. 3. Use saline nose sprays frequently 4. Saline irrigations of the nose can be very helpful if Holmes frequently.  * 4X daily for 1 week*  * Use of a nettie pot can be helpful with this. Follow directions with this* 5. Drink plenty of fluids 6. Keep thermostat turn down low 7.For any cough or congestion  Use plain Mucinex- regular strength or max strength is fine   * Children- consult with Pharmacist for dosing 8. For fever or aces or pains- take tylenol or ibuprofen appropriate for age and weight.  * for fevers greater than 101 orally you may alternate ibuprofen and tylenol every  3 hours.    - molnupiravir EUA 200 mg CAPS; Take 4 capsules (800 mg total) by mouth 2 (two) times daily for 5 days.  Dispense: 40 capsule; Refill: 0 - benzonatate (TESSALON PERLES) 100 MG capsule; Take 1 capsule (100 mg total) by mouth 3 (three) times daily as needed.  Dispense: 20 capsule; Refill: 0      Follow Up Instructions: I discussed the assessment and treatment plan with the patient. The patient was provided an opportunity to ask questions and  all were answered. The patient agreed with the plan and demonstrated an understanding of the instructions.  A copy of instructions were sent to the patient via MyChart.  The patient was advised to call back or seek an in-person evaluation if the symptoms worsen or if the condition fails to improve as anticipated.  Time:  I spent 15 minutes with the patient via telehealth technology discussing the above problems/concerns.    Brad Hassell Done, FNP

## 2020-10-18 ENCOUNTER — Other Ambulatory Visit (HOSPITAL_COMMUNITY): Payer: Self-pay

## 2020-11-30 ENCOUNTER — Other Ambulatory Visit: Payer: Self-pay | Admitting: Family Medicine

## 2020-11-30 ENCOUNTER — Other Ambulatory Visit (HOSPITAL_COMMUNITY): Payer: Self-pay

## 2020-11-30 ENCOUNTER — Other Ambulatory Visit: Payer: Self-pay | Admitting: Nurse Practitioner

## 2020-11-30 DIAGNOSIS — E7800 Pure hypercholesterolemia, unspecified: Secondary | ICD-10-CM

## 2020-12-01 ENCOUNTER — Other Ambulatory Visit (HOSPITAL_COMMUNITY): Payer: Self-pay

## 2020-12-01 MED FILL — Alirocumab Subcutaneous Solution Auto-Injector 150 MG/ML: SUBCUTANEOUS | 28 days supply | Qty: 2 | Fill #2 | Status: AC

## 2020-12-15 ENCOUNTER — Other Ambulatory Visit: Payer: Self-pay

## 2020-12-15 ENCOUNTER — Ambulatory Visit (INDEPENDENT_AMBULATORY_CARE_PROVIDER_SITE_OTHER): Payer: No Typology Code available for payment source | Admitting: Family Medicine

## 2020-12-15 ENCOUNTER — Encounter: Payer: Self-pay | Admitting: Family Medicine

## 2020-12-15 VITALS — BP 150/86 | HR 50 | Temp 97.3°F | Ht 71.0 in | Wt 201.0 lb

## 2020-12-15 DIAGNOSIS — R310 Gross hematuria: Secondary | ICD-10-CM | POA: Diagnosis not present

## 2020-12-15 LAB — CBC
HCT: 45.5 % (ref 39.0–52.0)
Hemoglobin: 15.1 g/dL (ref 13.0–17.0)
MCHC: 33.1 g/dL (ref 30.0–36.0)
MCV: 87.5 fl (ref 78.0–100.0)
Platelets: 257 10*3/uL (ref 150.0–400.0)
RBC: 5.2 Mil/uL (ref 4.22–5.81)
RDW: 14.3 % (ref 11.5–15.5)
WBC: 7.1 10*3/uL (ref 4.0–10.5)

## 2020-12-15 LAB — POCT URINALYSIS DIPSTICK
Bilirubin, UA: NEGATIVE
Blood, UA: NEGATIVE
Glucose, UA: NEGATIVE
Ketones, UA: NEGATIVE
Leukocytes, UA: NEGATIVE
Nitrite, UA: NEGATIVE
Protein, UA: NEGATIVE
Spec Grav, UA: 1.01 (ref 1.010–1.025)
Urobilinogen, UA: 0.2 E.U./dL
pH, UA: 6 (ref 5.0–8.0)

## 2020-12-15 LAB — COMPREHENSIVE METABOLIC PANEL
ALT: 31 U/L (ref 0–53)
AST: 21 U/L (ref 0–37)
Albumin: 4.5 g/dL (ref 3.5–5.2)
Alkaline Phosphatase: 74 U/L (ref 39–117)
BUN: 12 mg/dL (ref 6–23)
CO2: 29 mEq/L (ref 19–32)
Calcium: 9.9 mg/dL (ref 8.4–10.5)
Chloride: 103 mEq/L (ref 96–112)
Creatinine, Ser: 0.89 mg/dL (ref 0.40–1.50)
GFR: 98.58 mL/min (ref >60.00)
Glucose, Bld: 92 mg/dL (ref 70–99)
Potassium: 4.3 mEq/L (ref 3.5–5.1)
Sodium: 139 mEq/L (ref 135–145)
Total Bilirubin: 0.7 mg/dL (ref 0.2–1.2)
Total Protein: 6.9 g/dL (ref 6.0–8.3)

## 2020-12-15 NOTE — Progress Notes (Signed)
rudd

## 2020-12-15 NOTE — Patient Instructions (Signed)
Hold Plavix. Push fluids

## 2020-12-15 NOTE — Progress Notes (Signed)
[facility] [address] [place] [address] Dept: [phone] Dept Fax: [phone]  Office Visit  Subjective:    Patient ID: Brad Holmes, male    DOB: 07-26-1970, 50 y.o..   MRN: [id number]  Chief Complaint  Patient presents with   Acute Visit    C/o having  low back pain, pain with urination with blood in urine.      History of Present Illness:  Patient is in today for assessment of recent gross hematuria. Brad Holmes notes that on last Friday, he noted some penile discomfort. Later that day, he saw some brownish colored urine. Over the weekend, he had no symptoms. However, on Tuesday, he felt a strong urge to urinate and then saw some blood clots in the toilet. Soon after her urinated pure blood and clots. Since then, his urine has cleared up, as he has pushed fluids. He is note complaining of current flank pain. He notes he had some right lower back pain about a month ago, that felt muscular. He also had some recent pain in his coccygeal area about 2 weeks ago. He has taken a small amount of ibuprofen and Tylenol for this. He has a history of Post-COVID TIAs and had been treated with aspirin and Plavix. However, he had stopped the aspirin due to GI irritation and stopped his Plavix about a month ago due to some hemorrhoidal bleeding. He has no prior history of kidney stones. He is not a smoker.   Past Medical History: Patient Active Problem List   Diagnosis Date Noted   Adhesive capsulitis of right shoulder 02/05/2020   TIA (transient ischemic attack) 11/14/2018   Abnormal transaminases 08/12/2018   Family history of early CAD 08/11/2018   Hypertension 08/11/2018   Hypercholesteremia 08/11/2018   Screening for HIV without presence of risk factors 08/11/2018   Colon cancer screening 08/11/2018   Chronic arthralgias of knees and hips 10/09/2016   Past Surgical History:  Procedure Laterality Date   CARDIAC CATHETERIZATION      05/12/09   EYE SURGERY     IR 3D INDEPENDENT WKST  11/24/2018   IR ANGIO INTRA EXTRACRAN SEL COM CAROTID INNOMINATE BILAT MOD SED  11/14/2018   IR ANGIO INTRA EXTRACRAN SEL INTERNAL CAROTID UNI R MOD SED  11/24/2018   IR ANGIO VERTEBRAL SEL SUBCLAVIAN INNOMINATE UNI R MOD SED  11/24/2018   IR ANGIO VERTEBRAL SEL VERTEBRAL BILAT MOD SED  11/14/2018   IR NEURO EACH ADD'L AFTER BASIC UNI RIGHT (MS)  11/24/2018   RADIOLOGY WITH ANESTHESIA N/A 11/24/2018   Procedure: RADIOLOGY WITH ANESTHESIA  STENTING;  Surgeon: Luanne Bras, MD;  Location: [place] [facility];  Service: Radiology;  Laterality: N/A;   WISDOM TOOTH EXTRACTION     Family History  Problem Relation Age of Onset   Hyperlipidemia Mother    Heart disease Mother 46   Heart failure Mother 25   Colon cancer Neg Hx    Colon polyps Neg Hx    Esophageal cancer Neg Hx    Rectal cancer Neg Hx    Stomach cancer Neg Hx    Outpatient Medications Prior to Visit  Medication Sig Dispense Refill   carvedilol (COREG) 3.125 MG tablet TAKE 1 TABLET (3.125 MG TOTAL) BY MOUTH 2 (TWO) TIMES DAILY. 90 tablet 0   PRALUENT 150 MG/ML SOAJ INJECT 1 DOSE INTO THE SKIN EVERY 14 DAYS AS DIRECTED 2 mL 11   rosuvastatin (CRESTOR) 40 MG tablet TAKE 1 TABLET  BY MOUTH DAILY. 90 tablet 3   benzonatate (TESSALON PERLES) 100 MG capsule Take 1 capsule (100 mg total) by mouth 3 (three) times daily as needed. (Patient not taking: Reported on 12/15/2020) 20 capsule 0   clopidogrel (PLAVIX) 75 MG tablet TAKE 1 TABLET (75 MG TOTAL) BY MOUTH DAILY. (Patient not taking: Reported on 12/15/2020) 90 tablet 0   aspirin 325 MG EC tablet Take 1 tablet (325 mg total) by mouth daily. (Patient not taking: Reported on 12/15/2020) 30 tablet 0   carvedilol (COREG) 3.125 MG tablet TAKE 1 TABLET (3.125 MG TOTAL) BY MOUTH 2 (TWO) TIMES DAILY. 60 tablet 0   doxycycline (VIBRA-TABS) 100 MG tablet Take 1 tablet (100 mg total) by mouth 2 (two) times daily. 20 tablet 0   nabumetone (RELAFEN) 500 MG  tablet TAKE 1 TABLET BY MOUTH TWICE A DAY AS NEEDED FOR PAIN WITH FOOD 60 tablet 3   PRALUENT 150 MG/ML SOAJ INJECT 1 DOSE INTO THE SKIN EVERY 14 DAYS AS DIRECTED 2 mL 9   No facility-administered medications prior to visit.   Allergies  Allergen Reactions   No Known Allergies     Objective:   Today's Vitals   12/15/20 1138  BP: (!) 150/86  Pulse: (!) 50  Temp: (!) 97.3 F (36.3 C)  TempSrc: Temporal  SpO2: 99%  Weight: 201 lb (91.2 kg)  Height: 5\' 11"  (1.803 m)   Body mass index is 28.03 kg/m.   General: Well developed, well nourished. No acute distress. Abdomen: Soft, mild RLQ pain with first palpation, which then improved. Bowel sounds positive, normal pitch and frequency. No hepatosplenomegaly. No rebound or   guarding. Back: Straight. Mild right CVA tenderness. Psych: Alert and oriented. Normal mood and affect.  Health Maintenance Due  Topic Date Due   Hepatitis C Screening  Never done   TETANUS/TDAP  Never done   COLONOSCOPY (Pts 45-37yrs Insurance coverage will need to be confirmed)  Never done   Zoster Vaccines- Shingrix (1 of 2) Never done   COVID-19 Vaccine (2 - Pfizer series) 06/18/2019   INFLUENZA VACCINE  10/03/2020   Lab Results Component Ref Range & Units 11:37  (12/15/20) 2 yr ago  (11/24/18) 2 yr ago  (11/14/18)  Color, UA  yellow     Clarity, UA  clear     Glucose, UA Negative Negative     Bilirubin, UA  negative     Ketones, UA  negative     Spec Grav, UA 1.010 - 1.025 1.010     Blood, UA  negative     pH, UA 5.0 - 8.0 6.0     Protein, UA Negative Negative     Urobilinogen, UA 0.2 or 1.0 E.U./dL 0.2     Nitrite, UA  negative     Leukocytes, UA Negative Negative     Appearance   CLEAR R  CLEAR       Assessment & Plan:   1. Gross hematuria Brad Holmes has a history of gross hematuria with blood clots. he has mild right flank/CVA tenderness on exam. I will check labs to screen for potential bleeding issues, renal or liver impairment, and a  microscopic exam of his urine. I will order an urgent CT renal stone study and refer him to urology. I recommended he continue to hold his Plavix and to push fluids.Follow-up will be based on results of his scan and urology assessment.  - POCT Urinalysis Dipstick - CT RENAL STONE STUDY; Future - Ambulatory  referral to Urology - Comprehensive metabolic panel - Urinalysis w microscopic + reflex cultur - CBC   Haydee Salter, MD

## 2020-12-16 ENCOUNTER — Telehealth: Payer: Self-pay | Admitting: Nurse Practitioner

## 2020-12-16 LAB — URINALYSIS W MICROSCOPIC + REFLEX CULTURE
Bacteria, UA: NONE SEEN /HPF
Bilirubin Urine: NEGATIVE
Glucose, UA: NEGATIVE
Hgb urine dipstick: NEGATIVE
Hyaline Cast: NONE SEEN /LPF
Ketones, ur: NEGATIVE
Leukocyte Esterase: NEGATIVE
Nitrites, Initial: NEGATIVE
Protein, ur: NEGATIVE
RBC / HPF: NONE SEEN /HPF (ref 0–2)
Specific Gravity, Urine: 1.007 (ref 1.001–1.035)
Squamous Epithelial / HPF: NONE SEEN /HPF (ref <=5)
pH: 6 (ref 5.0–8.0)

## 2020-12-16 LAB — NO CULTURE INDICATED

## 2020-12-16 NOTE — Telephone Encounter (Signed)
Caryl Pina from [facility] is wanting a call back concerning pt's CT. They are wanting to schedule an appointment for him, but can not do this until his insurance has authorized this procedure. Please advise Caryl Pina at [phone].

## 2020-12-23 ENCOUNTER — Encounter: Payer: Self-pay | Admitting: Family Medicine

## 2020-12-23 ENCOUNTER — Ambulatory Visit
Admission: RE | Admit: 2020-12-23 | Discharge: 2020-12-23 | Disposition: A | Discharge status: Home / Self Care | Payer: No Typology Code available for payment source | Source: Ambulatory Visit | Attending: Family Medicine | Admitting: Family Medicine

## 2020-12-23 DIAGNOSIS — I700 Atherosclerosis of aorta: Secondary | ICD-10-CM | POA: Insufficient documentation

## 2020-12-23 DIAGNOSIS — R310 Gross hematuria: Secondary | ICD-10-CM

## 2020-12-23 DIAGNOSIS — K5790 Diverticulosis of intestine, part unspecified, without perforation or abscess without bleeding: Secondary | ICD-10-CM | POA: Insufficient documentation

## 2020-12-24 ENCOUNTER — Encounter: Payer: Self-pay | Admitting: Family Medicine

## 2020-12-27 ENCOUNTER — Other Ambulatory Visit: Payer: Self-pay | Admitting: Urology

## 2021-01-09 ENCOUNTER — Other Ambulatory Visit: Payer: Self-pay

## 2021-01-09 ENCOUNTER — Encounter (HOSPITAL_BASED_OUTPATIENT_CLINIC_OR_DEPARTMENT_OTHER): Payer: Self-pay | Admitting: Urology

## 2021-01-09 DIAGNOSIS — N35912 Unspecified bulbous urethral stricture, male: Secondary | ICD-10-CM | POA: Diagnosis not present

## 2021-01-09 DIAGNOSIS — N35919 Unspecified urethral stricture, male, unspecified site: Secondary | ICD-10-CM

## 2021-01-09 DIAGNOSIS — R310 Gross hematuria: Secondary | ICD-10-CM | POA: Diagnosis not present

## 2021-01-09 DIAGNOSIS — N3031 Trigonitis with hematuria: Secondary | ICD-10-CM | POA: Diagnosis not present

## 2021-01-09 HISTORY — DX: Unspecified urethral stricture, male, unspecified site: N35.919

## 2021-01-09 NOTE — Progress Notes (Addendum)
Spoke w/ via phone for pre-op interview---pt Lab needs dos---- ekg             Lab results------cbc cmp ua 12-15-2020 epic COVID test -----patient states asymptomatic no test needed Arrive at -------830 am 01-13-2021 NPO after MN NO Solid Food.  Clear liquids from MN until---730 am Med rec completed Medications to take morning of surgery -----carvedilol Diabetic medication -----n/a Patient instructed to bring photo id and insurance card day of surgery Patient aware to have Driver (ride ) / caregiver    for 24 hours after surgery  wife christy Patient Special Instructions -----none Pre-Op special Istructions -----none Patient verbalized understanding of instructions that were given at this phone interview. Patient denies  cardiac s & s, shortness of breath, chest pain, fever, cough at this phone interview.   Anesthesia Review:htn of htn, hyperlipidemia, right cr stroke 11-13-2020 with no residual deficits  Addendum: reviewed pt medical history with dr Thurmond Butts ellander mda, pt ok for [facility] surgery on 01-13-2021 per dr ryan ellander mda.  IWL:[id number] nche np lov 01-22-2020 epic Cardiologist : dr Clayton Lefort 08-09-2020 epic Chest x-ray :11-14-2018 epic EKG :11-14-2018  Echo :11-14-2018 epic Cta angio head/neck 09-28-2019 epic Neurology lov Janett Billow mccue np 12-22-2020 epic Cerebral angiogram 11-14-2018 epic See dr devashwar vascular ir q year for follow up per pt Stress test:none Cardiac Cath : 05-12-2009 per pt Activity level: fully independent can climb flight of stairs  without problems Sleep Study/ CPAP :none   Blood Thinner/ Instructions /Last Dose: last dose 2 months ago due to hematuria per pt on 01-09-2021 ASA / Instructions/ Last Dose :  last dose per pt 6 months ago due to hematuria per pt on 01-09-2021

## 2021-01-13 ENCOUNTER — Other Ambulatory Visit (HOSPITAL_COMMUNITY): Payer: Self-pay

## 2021-01-13 ENCOUNTER — Ambulatory Visit (HOSPITAL_BASED_OUTPATIENT_CLINIC_OR_DEPARTMENT_OTHER): Payer: No Typology Code available for payment source | Admitting: Anesthesiology

## 2021-01-13 ENCOUNTER — Ambulatory Visit (HOSPITAL_BASED_OUTPATIENT_CLINIC_OR_DEPARTMENT_OTHER)
Admission: RE | Admit: 2021-01-13 | Discharge: 2021-01-13 | Disposition: A | Discharge status: Home / Self Care | Payer: No Typology Code available for payment source | Source: Ambulatory Visit | Attending: Urology | Admitting: Urology

## 2021-01-13 ENCOUNTER — Encounter (HOSPITAL_BASED_OUTPATIENT_CLINIC_OR_DEPARTMENT_OTHER)
Admission: RE | Disposition: A | Discharge status: Home / Self Care | Payer: Self-pay | Source: Ambulatory Visit | Attending: Urology

## 2021-01-13 ENCOUNTER — Encounter (HOSPITAL_BASED_OUTPATIENT_CLINIC_OR_DEPARTMENT_OTHER): Payer: Self-pay | Admitting: Urology

## 2021-01-13 DIAGNOSIS — N35912 Unspecified bulbous urethral stricture, male: Secondary | ICD-10-CM | POA: Diagnosis not present

## 2021-01-13 DIAGNOSIS — R310 Gross hematuria: Secondary | ICD-10-CM | POA: Insufficient documentation

## 2021-01-13 DIAGNOSIS — N3031 Trigonitis with hematuria: Secondary | ICD-10-CM | POA: Insufficient documentation

## 2021-01-13 HISTORY — PX: CYSTOSCOPY WITH URETHRAL DILATATION: SHX5125

## 2021-01-13 HISTORY — DX: Personal history of COVID-19: Z86.16

## 2021-01-13 SURGERY — CYSTOSCOPY, WITH URETHRAL DILATION
Anesthesia: General | Site: Renal

## 2021-01-13 MED ORDER — LIDOCAINE 2% (20 MG/ML) 5 ML SYRINGE
INTRAMUSCULAR | Status: AC
  Filled 2021-01-13: qty 5

## 2021-01-13 MED ORDER — LIDOCAINE 2% (20 MG/ML) 5 ML SYRINGE
INTRAMUSCULAR | Status: DC | PRN
  Administered 2021-01-13: 100 mg via INTRAVENOUS

## 2021-01-13 MED ORDER — WHITE PETROLATUM EX OINT
TOPICAL_OINTMENT | CUTANEOUS | Status: AC
  Filled 2021-01-13: qty 5

## 2021-01-13 MED ORDER — PROPOFOL 10 MG/ML IV BOLUS
INTRAVENOUS | Status: DC | PRN
  Administered 2021-01-13: 200 mg via INTRAVENOUS

## 2021-01-13 MED ORDER — HYDROCODONE-ACETAMINOPHEN 5-325 MG PO TABS
1.0000 | ORAL_TABLET | Freq: Four times a day (QID) | ORAL | 0 refills | Status: DC | PRN
  Filled 2021-01-13: qty 8, 2d supply, fill #0

## 2021-01-13 MED ORDER — OXYCODONE HCL 5 MG PO TABS: 5.0000 mg | ORAL_TABLET | ORAL | Status: DC | PRN

## 2021-01-13 MED ORDER — CEFAZOLIN SODIUM-DEXTROSE 2-4 GM/100ML-% IV SOLN
INTRAVENOUS | Status: AC
  Filled 2021-01-13: qty 100

## 2021-01-13 MED ORDER — ACETAMINOPHEN 325 MG RE SUPP: 650.0000 mg | RECTAL | Status: DC | PRN

## 2021-01-13 MED ORDER — ONDANSETRON HCL 4 MG/2ML IJ SOLN: 4.0000 mg | Freq: Once | INTRAMUSCULAR | Status: DC | PRN

## 2021-01-13 MED ORDER — STERILE WATER FOR IRRIGATION IR SOLN
Status: DC | PRN
  Administered 2021-01-13: 3000 mL

## 2021-01-13 MED ORDER — FENTANYL CITRATE (PF) 100 MCG/2ML IJ SOLN
INTRAMUSCULAR | Status: DC | PRN
  Administered 2021-01-13 (×2): 25 ug via INTRAVENOUS
  Administered 2021-01-13: 50 ug via INTRAVENOUS

## 2021-01-13 MED ORDER — OXYCODONE HCL 5 MG/5ML PO SOLN: 5.0000 mg | Freq: Once | ORAL | Status: DC | PRN

## 2021-01-13 MED ORDER — PROPOFOL 10 MG/ML IV BOLUS
INTRAVENOUS | Status: AC
  Filled 2021-01-13: qty 20

## 2021-01-13 MED ORDER — SODIUM CHLORIDE 0.9% FLUSH: 3.0000 mL | INTRAVENOUS | Status: DC | PRN

## 2021-01-13 MED ORDER — LACTATED RINGERS IV SOLN: INTRAVENOUS | Status: DC

## 2021-01-13 MED ORDER — DEXAMETHASONE SODIUM PHOSPHATE 10 MG/ML IJ SOLN
INTRAMUSCULAR | Status: DC | PRN
Start: 2021-01-13 — End: 2021-01-13
  Administered 2021-01-13: 10 mg via INTRAVENOUS

## 2021-01-13 MED ORDER — MIDAZOLAM HCL 5 MG/5ML IJ SOLN
INTRAMUSCULAR | Status: DC | PRN
  Administered 2021-01-13: 2 mg via INTRAVENOUS

## 2021-01-13 MED ORDER — ONDANSETRON HCL 4 MG/2ML IJ SOLN
INTRAMUSCULAR | Status: AC
  Filled 2021-01-13: qty 2

## 2021-01-13 MED ORDER — GLYCOPYRROLATE PF 0.2 MG/ML IJ SOSY
PREFILLED_SYRINGE | INTRAMUSCULAR | Status: AC
  Filled 2021-01-13: qty 1

## 2021-01-13 MED ORDER — FENTANYL CITRATE (PF) 100 MCG/2ML IJ SOLN: 25.0000 ug | INTRAMUSCULAR | Status: DC | PRN

## 2021-01-13 MED ORDER — SODIUM CHLORIDE 0.9% FLUSH: 3.0000 mL | Freq: Two times a day (BID) | INTRAVENOUS | Status: DC

## 2021-01-13 MED ORDER — MIDAZOLAM HCL 2 MG/2ML IJ SOLN
INTRAMUSCULAR | Status: AC
  Filled 2021-01-13: qty 2

## 2021-01-13 MED ORDER — GLYCOPYRROLATE PF 0.2 MG/ML IJ SOSY
PREFILLED_SYRINGE | INTRAMUSCULAR | Status: DC | PRN
  Administered 2021-01-13: .2 mg via INTRAVENOUS

## 2021-01-13 MED ORDER — PHENYLEPHRINE 40 MCG/ML (10ML) SYRINGE FOR IV PUSH (FOR BLOOD PRESSURE SUPPORT)
PREFILLED_SYRINGE | INTRAVENOUS | Status: AC
  Filled 2021-01-13: qty 10

## 2021-01-13 MED ORDER — WHITE PETROLATUM EX OINT
TOPICAL_OINTMENT | CUTANEOUS | Status: AC
  Filled 2021-01-13: qty 10

## 2021-01-13 MED ORDER — ACETAMINOPHEN 325 MG PO TABS: 650.0000 mg | ORAL_TABLET | ORAL | Status: DC | PRN

## 2021-01-13 MED ORDER — SODIUM CHLORIDE 0.9 % IV SOLN: 250.0000 mL | INTRAVENOUS | Status: DC | PRN

## 2021-01-13 MED ORDER — MORPHINE SULFATE (PF) 4 MG/ML IV SOLN: 2.0000 mg | INTRAVENOUS | Status: DC | PRN

## 2021-01-13 MED ORDER — FENTANYL CITRATE (PF) 100 MCG/2ML IJ SOLN
INTRAMUSCULAR | Status: AC
  Filled 2021-01-13: qty 2

## 2021-01-13 MED ORDER — DEXAMETHASONE SODIUM PHOSPHATE 10 MG/ML IJ SOLN
INTRAMUSCULAR | Status: AC
  Filled 2021-01-13: qty 1

## 2021-01-13 MED ORDER — CEFAZOLIN SODIUM-DEXTROSE 2-4 GM/100ML-% IV SOLN
2.0000 g | INTRAVENOUS | Status: AC
  Administered 2021-01-13: 2 g via INTRAVENOUS

## 2021-01-13 MED ORDER — OXYCODONE HCL 5 MG PO TABS: 5.0000 mg | ORAL_TABLET | Freq: Once | ORAL | Status: DC | PRN

## 2021-01-13 MED ORDER — ONDANSETRON HCL 4 MG/2ML IJ SOLN
INTRAMUSCULAR | Status: DC | PRN
  Administered 2021-01-13: 4 mg via INTRAVENOUS

## 2021-01-13 MED ORDER — PHENYLEPHRINE 40 MCG/ML (10ML) SYRINGE FOR IV PUSH (FOR BLOOD PRESSURE SUPPORT)
PREFILLED_SYRINGE | INTRAVENOUS | Status: DC | PRN
  Administered 2021-01-13 (×2): 80 ug via INTRAVENOUS

## 2021-01-13 MED ORDER — KETOROLAC TROMETHAMINE 30 MG/ML IJ SOLN: 30.0000 mg | Freq: Once | INTRAMUSCULAR | Status: DC | PRN

## 2021-01-13 MED ORDER — IOHEXOL 300 MG/ML  SOLN
INTRAMUSCULAR | Status: DC | PRN
  Administered 2021-01-13: 6 mL

## 2021-01-13 SURGICAL SUPPLY — 37 items
BAG DRAIN URO-CYSTO SKYTR STRL (DRAIN) ×2 IMPLANT
BAG DRN ANRFLXCHMBR STRAP LEK (BAG) ×1
BAG DRN UROCATH (DRAIN) ×1
BAG URINE LEG 19OZ MD ST LTX (BAG) ×1 IMPLANT
BALLN NEPHROSTOMY (BALLOONS) ×2
BALLN OPTILUME DCB 30X3X75 (BALLOONS) ×2
BALLOON NEPHROSTOMY (BALLOONS) ×1 IMPLANT
BALLOON OPTILUME DCB 30X3X75 (BALLOONS) IMPLANT
CATH COUNCIL 22FR (CATHETERS) IMPLANT
CATH FOLEY 2W COUNCIL 20FR 5CC (CATHETERS) IMPLANT
CATH FOLEY 2W COUNCIL 5CC 16FR (CATHETERS) IMPLANT
CATH FOLEY 2W COUNCIL 5CC 18FR (CATHETERS) IMPLANT
CATH FOLEY 2WAY SLVR  5CC 16FR (CATHETERS)
CATH FOLEY 2WAY SLVR  5CC 18FR (CATHETERS) ×2
CATH FOLEY 2WAY SLVR 5CC 16FR (CATHETERS) IMPLANT
CATH FOLEY 2WAY SLVR 5CC 18FR (CATHETERS) IMPLANT
CATH URET 5FR 28IN OPEN ENDED (CATHETERS) ×1 IMPLANT
CLOTH BEACON ORANGE TIMEOUT ST (SAFETY) ×2 IMPLANT
ELECT REM PT RETURN 9FT ADLT (ELECTROSURGICAL) ×2
ELECTRODE REM PT RTRN 9FT ADLT (ELECTROSURGICAL) ×1 IMPLANT
GLOVE SURG POLYISO LF SZ8 (GLOVE) ×2 IMPLANT
GLOVE SURG UNDER POLY LF SZ7 (GLOVE) ×2 IMPLANT
GOWN SRG LRG 43XLVL 3 (GOWN DISPOSABLE) IMPLANT
GOWN STRL NON-REIN LRG LVL3 (GOWN DISPOSABLE) ×2
GOWN STRL REUS W/TWL XL LVL3 (GOWN DISPOSABLE) ×2 IMPLANT
GUIDEWIRE STR DUAL SENSOR (WIRE) IMPLANT
KIT TURNOVER CYSTO (KITS) ×2 IMPLANT
MANIFOLD NEPTUNE II (INSTRUMENTS) ×1 IMPLANT
NDL SAFETY ECLIPSE 18X1.5 (NEEDLE) IMPLANT
NEEDLE HYPO 18GX1.5 SHARP (NEEDLE)
NEEDLE HYPO 22GX1.5 SAFETY (NEEDLE) IMPLANT
NS IRRIG 500ML POUR BTL (IV SOLUTION) IMPLANT
PACK CYSTO (CUSTOM PROCEDURE TRAY) ×2 IMPLANT
SYR 20ML LL LF (SYRINGE) IMPLANT
SYR 30ML LL (SYRINGE) IMPLANT
TUBE CONNECTING 12X1/4 (SUCTIONS) IMPLANT
WATER STERILE IRR 3000ML UROMA (IV SOLUTION) ×2 IMPLANT

## 2021-01-13 NOTE — Anesthesia Preprocedure Evaluation (Signed)
Anesthesia Evaluation  Patient identified by MRN, date of birth, ID band Patient awake    Reviewed: Allergy & Precautions, NPO status , Patient's Chart, lab work & pertinent test results  Airway Mallampati: II  TM Distance: >3 FB Neck ROM: Full    Dental no notable dental hx.    Pulmonary neg pulmonary ROS,    Pulmonary exam normal breath sounds clear to auscultation       Cardiovascular hypertension, Normal cardiovascular exam Rhythm:Regular Rate:Normal     Neuro/Psych CVA negative psych ROS   GI/Hepatic negative GI ROS, Neg liver ROS,   Endo/Other  negative endocrine ROS  Renal/GU negative Renal ROS  negative genitourinary   Musculoskeletal negative musculoskeletal ROS (+)   Abdominal   Peds negative pediatric ROS (+)  Hematology negative hematology ROS (+)   Anesthesia Other Findings   Reproductive/Obstetrics negative OB ROS                             Anesthesia Physical Anesthesia Plan  ASA: 3  Anesthesia Plan: General   Post-op Pain Management:    Induction: Intravenous  PONV Risk Score and Plan: 2 and Ondansetron, Dexamethasone and Treatment may vary due to age or medical condition  Airway Management Planned: LMA  Additional Equipment:   Intra-op Plan:   Post-operative Plan: Extubation in OR  Informed Consent: I have reviewed the patients History and Physical, chart, labs and discussed the procedure including the risks, benefits and alternatives for the proposed anesthesia with the patient or authorized representative who has indicated his/her understanding and acceptance.     Dental advisory given  Plan Discussed with: CRNA and Surgeon  Anesthesia Plan Comments:         Anesthesia Quick Evaluation

## 2021-01-13 NOTE — Op Note (Signed)
Procedure: 1.  Cystoscopy with balloon dilation of bulbar urethral stricture. 2.  Bilateral retrograde pyelography with interpretation. 3.  Bladder biopsy and fulguration. 4.  Application of fluoroscopy.  Preop diagnosis: Recurrent bulbar urethral stricture and gross hematuria.  Postop diagnosis: Same with bladder wall erythema worrisome for carcinoma in situ.  Surgeon: Dr. Irine Seal.  Anesthesia: General.  Specimen: Bladder biopsies from the right lateral, left lateral and posterior bladder wall.  Drain: 81 [place] Foley catheter.  EBL: None.  Complications: None.  Indications: Brad Holmes is a 50 year old male who previously undergone visual internal urethrotomy of a bulbar urethral stricture several years ago.  He returned recently with gross hematuria and on cystoscopy a recurrent bulbar stricture that would not allow passage of the scope.  He had a CT stone study on 12/23/2020 that demonstrated no upper tract lesions or concerns.  It was felt that he needed to undergo cystoscopy with balloon dilation of the stricture and possible OptiLube balloon placement with cystoscopy to complete his hematuria work-up.  Procedure: He was taken operating room was given antibiotic.  A general anesthetic was induced.  He was placed in lithotomy position and fitted with PAS hose.  His perineum and genitalia were prepped with Betadine solution he was draped in usual sterile fashion.  Cystoscopy was performed using a 21 [place] scope and 30 degree lens.  Examination revealed a normal anterior urethra.  In the mid bulb there was a tight stricture approximately 6-8 French in diameter that would not allow passage of the cystoscope.  The cystoscope was then exchanged for a single-lumen short ureteroscope which was then advanced through the stricture into the bladder.  The more proximal bulbar urethra did have some mild squamous metaplasia.  The prostatic urethra was short with mild hyperplasia without significant  obstruction.  There appeared to be diffuse patchy erythema of the bladder wall on the ureteroscopic view.  A sensor wire was advanced through the ureteroscope into the bladder and the ureteroscope was removed.  A 24 French by 15 cm balloon dilation catheter was placed across the stricture and inflated to 20 atm.  After 1 minute the balloon was deflated and removed.  The cystoscope was then reinserted over the wire and the stricture appeared well disrupted.  The cystoscope was then advanced into the bladder and cystoscopy was completed.  He did have diffuse patchy erythema of the bladder wall that was quite concerning for carcinoma in situ although an inflammatory process could be considered.  No papillary lesions were identified.  Ureteral orifices were unremarkable.  Because of the bladder findings and the lack of contrast on the CT scan, I felt that bilateral retrograde pyelography was indicated.  Half-strength Omnipaque was then used with a 5 [place] open-ended catheter to perform the retrograde pyelograms.  A left retrograde pyelogram revealed a normal ureter and intrarenal collecting system without filling defects.  The right retrograde pyelogram demonstrated a normal ureter and intrarenal collecting system without filling defects.  After completion of the retrograde pyelography, cup biopsies were obtained from the bladder wall and areas of concern with 2 biopsies each from the right lateral wall, the posterior wall and the left lateral wall.  The biopsy sites were then fulgurated with a Bugbee electrode.  Once hemostasis was achieved, the bladder was drained and the cystoscope was removed.  An 36 French Foley catheter was inserted and the balloon was filled with 10 mL of sterile fluid.  I elected not to proceed with OPTi lumen balloon dilation because  of the bladder findings and the need for biopsy as well as the potential need for multiple instrumentations in the future for instillation of  intravesical chemotherapy if he is indeed found to have carcinoma in situ..  If he has a benign biopsy, I will monitor him and then reconsider the use of the optical and balloon.  He was taken down from lithotomy position, his anesthetic was reversed and he was moved to recovery in stable condition.  There were no complications.

## 2021-01-13 NOTE — Interval H&P Note (Signed)
History and Physical Interval Note:  01/13/2021 10:20 AM  Brad Holmes  has presented today for surgery, with the diagnosis of BULBAR URETHAL STRICTURE HEMATURIA.  The various methods of treatment have been discussed with the patient and family. After consideration of risks, benefits and other options for treatment, the patient has consented to  Procedure(s): CYSTOSCOPY WITH OPTILUM BALLOON  URETHRAL DILATATION (N/A) as a surgical intervention.  The patient's history has been reviewed, patient examined, no change in status, stable for surgery.  I have reviewed the patient's chart and labs.  Questions were answered to the patient's satisfaction.     Irine Seal

## 2021-01-13 NOTE — H&P (Signed)
I have blood in my urine.     Brad Holmes is a former patient who I last saw in 2006 when he had a VIU for a bulbar stricture. He had a episode of gross hematuria with clots about 2-3 weeks ago and again last week and had a CT stone study done on 10/21 that was negative. The UA had his PCP was negative.. he had some dark urine on Sunday. He has pain passing the clots. He has a history of a stroke/TIA that was felt to be related to COVID and was on plavix. He had a hemorrhoid about 6 weeks ago and stopped the plavix because of it. He stopped aspirin about 8 months ago. He is voiding well and his IPSS is 6 but he does report a decline in the stream.     ALLERGIES: None   MEDICATIONS: Plavix  Carvedilol 3.125 mg tablet  Praluent Pen 150 mg/ml pen injector  Rosuvastatin Calcium 40 mg tablet     GU PSH: No GU PSH      PSH Notes: Cardiac Catheterization, Eye Surgery, IR Angio intra extracran sel com carotid, IR 3D Independent WKST   NON-GU PSH: No Non-GU PSH    GU PMH: None   NON-GU PMH: Hypercholesterolemia Hypertension Stroke/TIA    FAMILY HISTORY: Heart Disease - Mother heart failure - Mother High Cholesterol - Father hyperlipidemia - Mother Kidney Stones - Father   SOCIAL HISTORY: Marital Status: Married Preferred Language: English; Ethnicity: Not Hispanic Or Latino; Race: White Current Smoking Status: Patient has never smoked.   Tobacco Use Assessment Completed: Used Tobacco in last 30 days? Does not use smokeless tobacco. Has never drank.  Drinks 3 caffeinated drinks per day. Has not had a blood transfusion.    REVIEW OF SYSTEMS:    GU Review Male:   Patient reports burning/ pain with urination, stream starts and stops, get up at night to urinate, hard to postpone urination, and leakage of urine. Patient denies erection problems, penile pain, have to strain to urinate , frequent urination, and trouble starting your stream.  Gastrointestinal (Upper):   Patient reports nausea.  Patient denies vomiting and indigestion/ heartburn.  Gastrointestinal (Lower):   Patient reports diarrhea and constipation.   Constitutional:   Patient reports weight loss and fatigue. Patient denies fever and night sweats.  Skin:   Patient denies skin rash/ lesion and itching.  Eyes:   Patient denies blurred vision and double vision.  Ears/ Nose/ Throat:   Patient reports sore throat and sinus problems.   Hematologic/Lymphatic:   Patient reports swollen glands and easy bruising.   Cardiovascular:   Patient denies leg swelling and chest pains.  Respiratory:   Patient denies cough and shortness of breath.  Endocrine:   Patient denies excessive thirst.  Musculoskeletal:   Patient reports back pain. Patient denies joint pain.  Neurological:   Patient denies headaches and dizziness.  Psychologic:   Patient denies depression and anxiety.   Notes: Blood in urine, weak stream    VITAL SIGNS:      10 /24/2022 02:57 PM  Weight 195 lb / 88.45 kg  Height 71 in / 180.34 cm  BP 123/81 mmHg  Pulse 76 /min  Temperature 98.2 F / 36.7 C  BMI 27.2 kg/m   GU PHYSICAL EXAMINATION:    Anus and Perineum: No hemorrhoids. No anal stenosis. No rectal fissure, no anal fissure. No edema, no dimple, no perineal tenderness, no anal tenderness.  Scrotum: No lesions. No edema. No cysts. No  warts.  Epididymides: Right: no spermatocele, no masses, no cysts, no tenderness, no induration, no enlargement. Left: no spermatocele, no masses, no cysts, no tenderness, no induration, no enlargement.  Testes: No tenderness, no swelling, no enlargement left testes. No tenderness, no swelling, no enlargement right testes. Normal location left testes. Normal location right testes. No mass, no cyst, no varicocele, no hydrocele left testes. No mass, no cyst, no varicocele, no hydrocele right testes.  Urethral Meatus: Normal size. No lesion, no wart, no discharge, no polyp. Normal location.  Penis: Circumcised, no warts, no cracks.  No dorsal Peyronie's plaques, no left corporal Peyronie's plaques, no right corporal Peyronie's plaques, no scarring, no warts. No balanitis, no meatal stenosis.  Prostate: Prostate 2 + size. Left lobe normal consistency, right lobe normal consistency. Symmetrical lobes. No prostate nodule. Left lobe no tenderness, right lobe no tenderness.   Seminal Vesicles: Nonpalpable.  Sphincter Tone: Normal sphincter. No rectal tenderness. No rectal mass.    MULTI-SYSTEM PHYSICAL EXAMINATION:    Constitutional: Well-nourished. No physical deformities. Normally developed. Good grooming.  Neck: Neck symmetrical, not swollen. Normal tracheal position.  Respiratory: Normal breath sounds. No labored breathing, no use of accessory muscles.   Cardiovascular: Regular rate and rhythm. No murmur, no gallop.  Lymphatic: No enlargement, no tenderness of supraclavicular, neck lymph nodes.  Skin: No paleness, no jaundice, no cyanosis. No lesion, no ulcer, no rash.  Neurologic / Psychiatric: Oriented to time, oriented to place, oriented to person. No depression, no anxiety, no agitation.  Gastrointestinal: No mass, no tenderness, no rigidity, non obese abdomen.  Musculoskeletal: Normal gait and station of head and neck.     Complexity of Data:  Lab Test Review:   BMP, CBC with Diff  Records Review:   Previous Doctor Records, Previous Patient Records  Urine Test Review:   Urinalysis  X-Ray Review: [facility] Protocol: Reviewed Films. Reviewed Report. Discussed With Patient. Negative study   Notes:                     Normal BMP and CBC. Records from Dr. Gena Fray reviewed.    PROCEDURES:         Flexible Cystoscopy - 52000  Risks, benefits, and some of the potential complications of the procedure were discussed. 47ml of 2% lidocaine jelly was instilled intraurethrally.  Cipro 500mg  given for antibiotic prophylaxis.     Meatus:  Normal size. Normal location. Normal condition.  Urethra:  Moderate bulbous stricture.  short with some squamous changes proximally. Unable to pass.   External Sphincter:  not visualized because of the stricture.   Verumontanum:  not visualized.   Prostate:  not visualized.  Bladder Neck:  not visualized.   Ureteral Orifices:  not visualized.  Bladder:  not visualized.       The procedure was well tolerated and there were no complications.         Urinalysis Dipstick Dipstick Cont'd  Color: Amber Bilirubin: Neg mg/dL  Appearance: Clear Ketones: Neg mg/dL  Specific Gravity: 1.030 Blood: Neg ery/uL  pH: 5.5 Protein: Trace mg/dL  Glucose: Neg mg/dL Urobilinogen: 0.2 mg/dL    Nitrites: Neg    Leukocyte Esterase: Neg leu/uL    ASSESSMENT:      ICD-10 Details  1 GU:   Gross hematuria - C62.3 Acute, Uncomplicated - He has intermittent gross hematuria that may be secondary to the recurrent stricture and urethral distention with voiding but he needs full cystoscopy to complete the evlauation.  2   Bulbar urethral stricture - N35.011 Chronic, Worsening - I will get him set him up for an optilume balloon dilation procedures and have reviewed the risks in detail.    PLAN:           Schedule Return Visit/Planned Activity: Next Available Appointment - Schedule Surgery          Document Letter(s):  Created for Patient: Clinical Summary         Notes:   CC: Dr. Arlester Marker.

## 2021-01-13 NOTE — Transfer of Care (Signed)
Immediate Anesthesia Transfer of Care Note  Patient: GARET HOOTON  Procedure(s) Performed: CYSTOSCOPY WITH, BALLOON  URETHRAL DILATATION , BILATERAL RETROGRADES, BLADDER BIOPSIES (Renal)  Patient Location: PACU  Anesthesia Type:General  Level of Consciousness: awake, alert , oriented and patient cooperative  Airway & Oxygen Therapy: Patient Spontanous Breathing  Post-op Assessment: Report given to RN and Post -op Vital signs reviewed and stable  Post vital signs: Reviewed and stable  Last Vitals:  Vitals Value Taken Time  BP 128/87 01/13/21 1108  Temp    Pulse 88 01/13/21 1110  Resp 14 01/13/21 1109  SpO2 98 % 01/13/21 1110  Vitals shown include unvalidated device data.  Last Pain:  Vitals:   01/13/21 0858  TempSrc: Oral  PainSc: 0-No pain      Patients Stated Pain Goal: 5 (14/38/88 7579)  Complications: No notable events documented.

## 2021-01-13 NOTE — Discharge Instructions (Addendum)
You may remove the catheter on Monday morning by cutting off the side arm to drain the balloon.   CYSTOSCOPY HOME CARE INSTRUCTIONS  Activity: Rest for the remainder of the day.  Do not drive or operate equipment today.  You may resume normal activities in one to two days as instructed by your physician.   Meals: Drink plenty of liquids and eat light foods such as gelatin or soup this evening.  You may return to a normal meal plan tomorrow.  Return to Work: You may return to work in one to two days or as instructed by your physician.  Special Instructions / Symptoms: Call your physician if any of these symptoms occur:   -persistent or heavy bleeding  -bleeding which continues after first few urination  -large blood clots that are difficult to pass  -urine stream diminishes or stops completely  -fever equal to or higher than 101 degrees Farenheit.  -cloudy urine with a strong, foul odor  -severe pain   Post Anesthesia Home Care Instructions  Activity: Get plenty of rest for the remainder of the day. A responsible individual must stay with you for 24 hours following the procedure.  For the next 24 hours, DO NOT: -Drive a car -Paediatric nurse -Drink alcoholic beverages -Take any medication unless instructed by your physician -Make any legal decisions or sign important papers.  Meals: Start with liquid foods such as gelatin or soup. Progress to regular foods as tolerated. Avoid greasy, spicy, heavy foods. If nausea and/or vomiting occur, drink only clear liquids until the nausea and/or vomiting subsides. Call your physician if vomiting continues.  Special Instructions/Symptoms: Your throat may feel dry or sore from the anesthesia or the breathing tube placed in your throat during surgery. If this causes discomfort, gargle with warm salt water. The discomfort should disappear within 24 hours.

## 2021-01-13 NOTE — Anesthesia Postprocedure Evaluation (Signed)
Anesthesia Post Note  Patient: Brad Holmes  Procedure(s) Performed: CYSTOSCOPY WITH, BALLOON  URETHRAL DILATATION , BILATERAL RETROGRADES, BLADDER BIOPSIES (Renal)     Patient location during evaluation: PACU Anesthesia Type: General Level of consciousness: awake and alert Pain management: pain level controlled Vital Signs Assessment: post-procedure vital signs reviewed and stable Respiratory status: spontaneous breathing, nonlabored ventilation, respiratory function stable and patient connected to nasal cannula oxygen Cardiovascular status: blood pressure returned to baseline and stable Postop Assessment: no apparent nausea or vomiting Anesthetic complications: no   No notable events documented.  Last Vitals:  Vitals:   01/13/21 1130 01/13/21 1138  BP: 133/87 127/79  Pulse: 77 77  Resp: 17 10  Temp:  (!) 36.3 C  SpO2: 95% 97%    Last Pain:  Vitals:   01/13/21 1138  TempSrc:   PainSc: 0-No pain                 [name] S

## 2021-01-13 NOTE — Anesthesia Procedure Notes (Signed)
Procedure Name: LMA Insertion Date/Time: 01/13/2021 10:26 AM Performed by: Rogers Blocker, CRNA Pre-anesthesia Checklist: Patient identified, Emergency Drugs available, Suction available and Patient being monitored Patient Re-evaluated:Patient Re-evaluated prior to induction Oxygen Delivery Method: Circle System Utilized Preoxygenation: Pre-oxygenation with 100% oxygen Induction Type: IV induction Ventilation: Mask ventilation without difficulty LMA: LMA inserted LMA Size: 5.0 Number of attempts: 1 Airway Equipment and Method: Bite block Placement Confirmation: positive ETCO2 Tube secured with: Tape Dental Injury: Teeth and Oropharynx as per pre-operative assessment

## 2021-01-16 ENCOUNTER — Encounter (HOSPITAL_BASED_OUTPATIENT_CLINIC_OR_DEPARTMENT_OTHER): Payer: Self-pay | Admitting: Urology

## 2021-01-16 LAB — SURGICAL PATHOLOGY

## 2021-02-02 ENCOUNTER — Other Ambulatory Visit (HOSPITAL_COMMUNITY): Payer: Self-pay

## 2021-02-02 MED ORDER — TAMSULOSIN HCL 0.4 MG PO CAPS
0.4000 mg | ORAL_CAPSULE | Freq: Every evening | ORAL | 3 refills | Status: DC
  Filled 2021-02-02: qty 30, 30d supply, fill #0

## 2021-02-10 ENCOUNTER — Other Ambulatory Visit (HOSPITAL_COMMUNITY): Payer: Self-pay

## 2021-04-13 ENCOUNTER — Telehealth: Payer: Self-pay | Admitting: Internal Medicine

## 2021-04-13 ENCOUNTER — Other Ambulatory Visit (HOSPITAL_COMMUNITY): Payer: Self-pay

## 2021-04-13 DIAGNOSIS — E7849 Other hyperlipidemia: Secondary | ICD-10-CM

## 2021-04-13 DIAGNOSIS — E7800 Pure hypercholesterolemia, unspecified: Secondary | ICD-10-CM

## 2021-04-13 MED ORDER — PRALUENT 150 MG/ML ~~LOC~~ SOAJ
SUBCUTANEOUS | 11 refills | Status: DC
  Filled 2021-04-13: qty 2, 30d supply, fill #0

## 2021-04-13 MED ORDER — ROSUVASTATIN CALCIUM 40 MG PO TABS
40.0000 mg | ORAL_TABLET | Freq: Every day | ORAL | 1 refills | Status: DC
  Filled 2021-04-13: qty 30, 30d supply, fill #0

## 2021-04-13 MED ORDER — CARVEDILOL 3.125 MG PO TABS
3.1250 mg | ORAL_TABLET | Freq: Two times a day (BID) | ORAL | 1 refills | Status: DC
  Filled 2021-04-13: qty 60, 30d supply, fill #0

## 2021-04-13 NOTE — Telephone Encounter (Signed)
°*  STAT* If patient is at the pharmacy, call can be transferred to refill team.   1. Which medications need to be refilled? (please list name of each medication and dose if known)  PRALUENT 150 MG/ML SOAJ rosuvastatin (CRESTOR) 40 MG tablet  carvedilol (COREG) 3.125 MG tablet  2. Which pharmacy/location (including street and city if local pharmacy) is medication to be sent to? [facility]  3. Do they need a 30 day or 90 day supply? 30 day   Patient has an appointment 05/23/2021

## 2021-04-13 NOTE — Telephone Encounter (Signed)
NMR lipoprofile ordered  MyChart message sent about lab work and refills sent

## 2021-04-13 NOTE — Telephone Encounter (Signed)
Patient called to schedule Lipid Clinic appointment. He is scheduled 3/21 and needs to know whether his lab orders are still good or if they have expired.

## 2021-05-23 ENCOUNTER — Telehealth: Payer: No Typology Code available for payment source | Admitting: Internal Medicine

## 2021-05-23 ENCOUNTER — Other Ambulatory Visit: Payer: Self-pay

## 2021-07-12 ENCOUNTER — Encounter: Payer: Self-pay | Admitting: Podiatry

## 2021-07-12 ENCOUNTER — Ambulatory Visit: Payer: Commercial Managed Care - PPO | Admitting: Podiatry

## 2021-07-12 ENCOUNTER — Other Ambulatory Visit (HOSPITAL_COMMUNITY): Payer: Self-pay

## 2021-07-12 DIAGNOSIS — B351 Tinea unguium: Secondary | ICD-10-CM

## 2021-07-12 MED ORDER — TERBINAFINE HCL 250 MG PO TABS
250.0000 mg | ORAL_TABLET | Freq: Every day | ORAL | 0 refills | Status: DC
  Filled 2021-07-12: qty 90, 90d supply, fill #0

## 2021-07-12 NOTE — Progress Notes (Signed)
Subjective:  ? ?Patient ID: Brad Holmes, male   DOB: 51 y.o.   MRN: [id number]  ? ?HPI ?Patient presents stating he was not able to get completely treated for his fungal infection due to having a stroke and he would like to try to commence treatment at the current time ? ? ?ROS ? ? ?   ?Objective:  ?Physical Exam  ?Neurovascular status intact with mycotic nail infection bilateral nailbeds with history several years ago of having had some treatment that improved it but he would like to resume treatment with oral medication laser ? ?   ?Assessment:  ?Mycotic nail infection bilateral that has occurred with patient who did have a stroke and was not able to take the medicine at last time or really ever take oral medicine ? ?   ?Plan:  ?Reviewed condition and recommended oral medicine he is placed on Lamisil 250 mg daily and I went ahead today and I recommended laser and we will get a schedule him for 3 laser treatments.  He did have liver function studies done which were normal I educated him on antifungal therapy and what to watch out for and will be seen back to recheck ?   ? ? ?

## 2021-07-24 ENCOUNTER — Other Ambulatory Visit (HOSPITAL_COMMUNITY): Payer: Self-pay

## 2021-08-01 ENCOUNTER — Ambulatory Visit (INDEPENDENT_AMBULATORY_CARE_PROVIDER_SITE_OTHER): Payer: Commercial Managed Care - PPO

## 2021-08-01 DIAGNOSIS — B351 Tinea unguium: Secondary | ICD-10-CM

## 2021-08-01 NOTE — Progress Notes (Unsigned)
Patient presents today for the 1st  laser treatment. Diagnosed with mycotic nail infection by Dr. Paulla Dolly.   Toenail most affected Bilateral hallux.  All other systems are negative.  Nails were filed thin. Laser therapy was administered to Bilateral toenails 1-5 and patient tolerated the treatment well. All safety precautions were in place.    Follow up in 4 weeks for laser # 2.  Picture of nails taken today to document visual progress

## 2021-08-01 NOTE — Patient Instructions (Signed)

## 2021-08-07 ENCOUNTER — Telehealth: Payer: Self-pay | Admitting: Nurse Practitioner

## 2021-08-07 NOTE — Telephone Encounter (Signed)
Pt is wanting to transfer his care from [facility] to Dr. Gena Fray, he is wanting a male provider. If this is OK just let me know.

## 2021-08-10 NOTE — Telephone Encounter (Signed)
I have left 2 messages on his cell phone and sent him  a mychart message relaying this information.

## 2021-08-22 ENCOUNTER — Ambulatory Visit (INDEPENDENT_AMBULATORY_CARE_PROVIDER_SITE_OTHER): Payer: Self-pay | Admitting: Family Medicine

## 2021-08-22 ENCOUNTER — Encounter: Payer: Self-pay | Admitting: Family Medicine

## 2021-08-22 VITALS — BP 136/84 | HR 83 | Temp 98.3°F | Ht 71.0 in | Wt 213.0 lb

## 2021-08-22 DIAGNOSIS — N35919 Unspecified urethral stricture, male, unspecified site: Secondary | ICD-10-CM | POA: Insufficient documentation

## 2021-08-22 DIAGNOSIS — Z125 Encounter for screening for malignant neoplasm of prostate: Secondary | ICD-10-CM

## 2021-08-22 DIAGNOSIS — Z1159 Encounter for screening for other viral diseases: Secondary | ICD-10-CM

## 2021-08-22 DIAGNOSIS — Z1211 Encounter for screening for malignant neoplasm of colon: Secondary | ICD-10-CM

## 2021-08-22 DIAGNOSIS — E782 Mixed hyperlipidemia: Secondary | ICD-10-CM

## 2021-08-22 DIAGNOSIS — B351 Tinea unguium: Secondary | ICD-10-CM | POA: Insufficient documentation

## 2021-08-22 DIAGNOSIS — R748 Abnormal levels of other serum enzymes: Secondary | ICD-10-CM

## 2021-08-22 DIAGNOSIS — I10 Essential (primary) hypertension: Secondary | ICD-10-CM

## 2021-08-22 NOTE — Progress Notes (Signed)
[facility] [address] [place] [address] Dept: [phone] Dept Fax: [phone]  Transfer of Care Office Visit  Subjective:    Patient ID: Brad Holmes, male    DOB: 1970/04/30, 51 y.o..   MRN: [id number]  Chief Complaint  Patient presents with   Establish Care    [facility]- establish care.  No concerns.  Fasting today.     History of Present Illness:  Patient is in today to establish care. Brad Holmes was born in [place], [place]. The family moved to [place] when he was 51 years old. He attended a year at [employer], as he was engaged in cycling. As his grades suffered, he transferred to [employer] for a year, then later attended the [employer] where he got a degree in Futures trader. He later received a Masters in Engineer, production from [employer]. 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 Since COVID, he has been working for [employer] ([employer]) related to [employer]. Brad Holmes is married for the 2nd time (4 years). He has three biologic children (17, 17, 13) and two step children (17, 14). He denies tobacco or drug use. He has not had any alcohol in 8 months.  Brad Holmes has a history of hypertension. He had a TIA some years ago, with full recovery. 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 He was previously on carvedilol and aspirin, but these have since been stopped. He has had elevated lipids and is managed by Dr,. Hilty on rosuvastatin 40 mg daily and Praluent injections.  Brad Holmes has a history of hematuria last Summer. He was worked up by urology. Initially, there was concern for bladder cancer, but biopsies showed the bleeding was from irritation of the bladder wall. he was found to have a urethral stricture, had an indwelling catheter for a period of time, and then was on tamsulosin afterwards. This has all been stopped  now, and he feels his symptoms are improved overall.  Brad Holmes has a history of onychomycosis. He is currently managed on terbenafine. He does have a remote history of abnormal LFTs, so is concerned about how this may be doing.  Past Medical History: Patient Active Problem List   Diagnosis Date Noted   Onychomycosis 08/22/2021   Diverticulosis 12/23/2020   Aortic atherosclerosis ([place]) 12/23/2020   Adhesive capsulitis of right shoulder 02/05/2020   TIA (transient ischemic attack) 11/14/2018   Abnormal transaminases 08/12/2018   Essential hypertension 08/11/2018   Hyperlipidemia 08/11/2018   Chronic arthralgias of knees and hips 10/09/2016   Past Surgical History:  Procedure Laterality Date   CARDIAC CATHETERIZATION     05/12/09   CYSTOSCOPY WITH URETHRAL DILATATION N/A 01/13/2021   Procedure: CYSTOSCOPY WITH, BALLOON  URETHRAL DILATATION , BILATERAL RETROGRADES, BLADDER BIOPSIES;  Surgeon: Irine Seal, MD;  Location: [facility];  Service: Urology;  Laterality: N/A;   EYE SURGERY Left 2017   macular traction surgery   IR 3D INDEPENDENT [facility]KST  11/24/2018   IR ANGIO INTRA EXTRACRAN SEL COM CAROTID INNOMINATE BILAT MOD SED  11/14/2018   IR ANGIO INTRA EXTRACRAN SEL INTERNAL CAROTID UNI R MOD SED  11/24/2018   IR ANGIO VERTEBRAL SEL SUBCLAVIAN INNOMINATE UNI R MOD SED  11/24/2018   IR ANGIO VERTEBRAL SEL VERTEBRAL BILAT MOD SED  11/14/2018   IR NEURO EACH ADD'L AFTER BASIC UNI RIGHT (MS)  11/24/2018   RADIOLOGY WITH ANESTHESIA N/A 11/24/2018   Procedure: RADIOLOGY WITH ANESTHESIA  STENTING;  Surgeon: Luanne Bras, MD;  Location: [place];  Service: Radiology;  Laterality: N/A;   WISDOM TOOTH EXTRACTION     age 70   Family History  Problem Relation Age of Onset   Cancer Mother        Lung   Hyperlipidemia Mother    Heart failure Father    Heart disease Father 84   Heart disease Paternal Uncle    Colon cancer Neg Hx    Colon polyps Neg Hx    Esophageal cancer  Neg Hx    Rectal cancer Neg Hx    Stomach cancer Neg Hx    Outpatient Medications Prior to Visit  Medication Sig Dispense Refill   PRALUENT 150 MG/ML SOAJ INJECT 1 DOSE INTO THE SKIN EVERY 14 DAYS AS DIRECTED 2 mL 11   rosuvastatin (CRESTOR) 40 MG tablet Take 1 tablet (40 mg total) by mouth daily. 30 tablet 1   terbinafine (LAMISIL) 250 MG tablet Take 1 tablet (250 mg total) by mouth daily. 90 tablet 0   carvedilol (COREG) 3.125 MG tablet Take 1 tablet (3.125 mg total) by mouth 2 (two) times daily. 60 tablet 1   HYDROcodone-acetaminophen (NORCO/VICODIN) 5-325 MG tablet Take 1 tablet by mouth every 6 (six) hours as needed for moderate pain. 8 tablet 0   tamsulosin (FLOMAX) 0.4 MG CAPS capsule Take 1 capsule (0.4 mg total) by mouth 30 minutes after evening meal 30 capsule 3   No facility-administered medications prior to visit.   Allergies  Allergen Reactions   No Known Allergies      Objective:   Today's Vitals   08/22/21 0804  BP: 136/84  Pulse: 83  Temp: 98.3 F (36.8 C)  TempSrc: Temporal  SpO2: 97%  Weight: 213 lb (96.6 kg)  Height: '5\' 11"'$  (1.803 m)   Body mass index is 29.71 kg/m.   General: Well developed, well nourished. No acute distress. Psych: Alert and oriented. Normal mood and affect.  Health Maintenance Due  Topic Date Due   Hepatitis C Screening  Never done   TETANUS/TDAP  Never done   COLONOSCOPY (Pts 45-28yr Insurance coverage will need to be confirmed)  Never done   Zoster Vaccines- Shingrix (1 of 2) Never done     Assessment & Plan:   1. Essential hypertension Blood pressure is mildly elevated. He is no longer on medication for this. We will follow this, but consider adding an agent if this remains high.  2. Mixed hyperlipidemia Due for follow-up lipids. He will continue to see Dr. HDebara Pickett [place] rosuvastatin 40 mg daily and Praluent.  - Lipid panel  3. Onychomycosis Currently on terbinafine 250 mg daily.  4. Abnormal transaminases In  light of terbenfine use and past history of elevated LFTS, we will reassess these.  - Comprehensive metabolic panel  5. Encounter for hepatitis C screening test for low risk patient  - HCV Ab w Reflex to Quant PCR  6. Screening for colon cancer  - Ambulatory referral to Gastroenterology  7. Screening for prostate cancer  - PSA  Return in about 6 months (around 02/21/2022) for Reassessment.   SHaydee Salter MD

## 2021-08-23 ENCOUNTER — Encounter: Payer: Self-pay | Admitting: Family Medicine

## 2021-08-23 LAB — COMPREHENSIVE METABOLIC PANEL
ALT: 38 U/L (ref 0–53)
AST: 20 U/L (ref 0–37)
Albumin: 4.4 g/dL (ref 3.5–5.2)
Alkaline Phosphatase: 86 U/L (ref 39–117)
BUN: 11 mg/dL (ref 6–23)
CO2: 28 mEq/L (ref 19–32)
Calcium: 9.7 mg/dL (ref 8.4–10.5)
Chloride: 101 mEq/L (ref 96–112)
Creatinine, Ser: 1.08 mg/dL (ref 0.40–1.50)
GFR: 78.56 mL/min (ref >60.00)
Glucose, Bld: 119 mg/dL — ABNORMAL HIGH (ref 70–99)
Potassium: 3.7 mEq/L (ref 3.5–5.1)
Sodium: 138 mEq/L (ref 135–145)
Total Bilirubin: 0.4 mg/dL (ref 0.2–1.2)
Total Protein: 7 g/dL (ref 6.0–8.3)

## 2021-08-23 LAB — LIPID PANEL
Cholesterol: 378 mg/dL — ABNORMAL HIGH (ref 0–200)
HDL: 40.6 mg/dL (ref >39.00)
NonHDL: 337.61
Total CHOL/HDL Ratio: 9
Triglycerides: 248 mg/dL — ABNORMAL HIGH (ref 0.0–149.0)
VLDL: 49.6 mg/dL — ABNORMAL HIGH (ref 0.0–40.0)

## 2021-08-23 LAB — HCV INTERPRETATION

## 2021-08-23 LAB — LDL CHOLESTEROL, DIRECT: Direct LDL: 285 mg/dL

## 2021-08-23 LAB — PSA: PSA: 0.41 ng/mL (ref 0.10–4.00)

## 2021-08-23 LAB — HCV AB W REFLEX TO QUANT PCR: HCV Ab: NONREACTIVE

## 2021-09-01 ENCOUNTER — Ambulatory Visit (INDEPENDENT_AMBULATORY_CARE_PROVIDER_SITE_OTHER): Payer: Self-pay

## 2021-09-01 DIAGNOSIS — B351 Tinea unguium: Secondary | ICD-10-CM

## 2021-09-01 NOTE — Progress Notes (Signed)
Patient presents today for the 2nd  laser treatment. Diagnosed with mycotic nail infection by Dr. Paulla Dolly.   Toenail most affected Bilateral hallux.  All other systems are negative.  Nails were filed thin. Laser therapy was administered to Bilateral toenails 1-5 and patient tolerated the treatment well. All safety precautions were in place.    Follow up in 4 weeks for laser # 3.

## 2021-09-29 ENCOUNTER — Other Ambulatory Visit: Payer: Commercial Managed Care - PPO

## 2021-09-29 ENCOUNTER — Encounter: Payer: Self-pay | Admitting: Podiatry

## 2021-10-12 ENCOUNTER — Other Ambulatory Visit: Payer: Commercial Managed Care - PPO

## 2021-12-03 IMAGING — CT CT RENAL STONE PROTOCOL
2 of 4 series · 15 of 46 positions shown, 17 images · non-contrast
Comparison: Abdominal ultrasound 11/27/2013. CT abdomen and pelvis
03/03/2004.

CLINICAL DATA: Gross hematuria for 1 week. Low back pain. Dysuria.

EXAM:
CT ABDOMEN AND PELVIS WITHOUT CONTRAST
TECHNIQUE: Multidetector CT imaging of the abdomen and pelvis was performed
following the standard protocol without IV contrast.

[Series 2: renal stone 5.00 br40 s3 axial · axial · 0.71mm/px · z∈[+1226,+1661]mm · 12 of 97 slices shown, 14 images]
[im 5/97  soft-tissue]
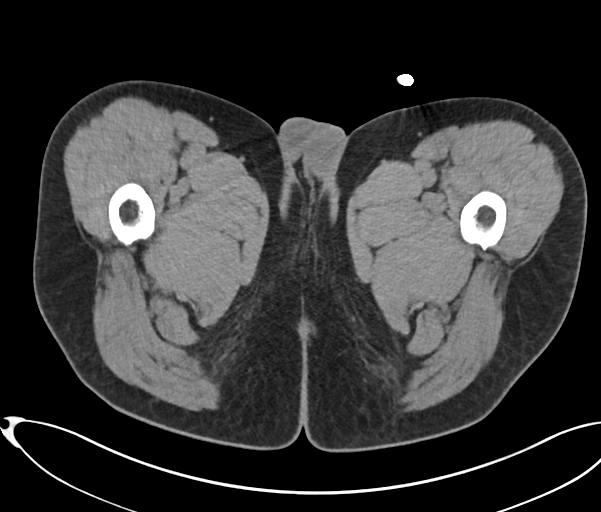
[im 5/97  bone]
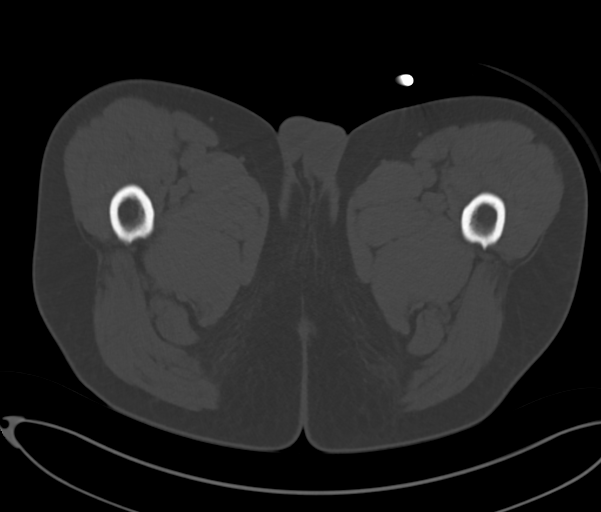
[im 13/97  soft-tissue]
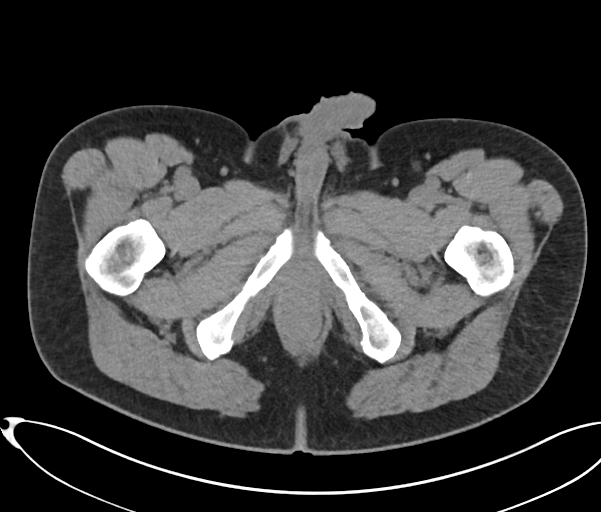
[im 21/97  soft-tissue]
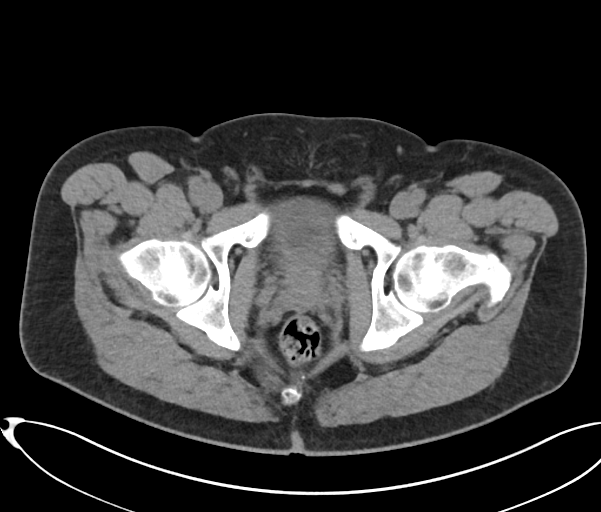
[im 30/97  soft-tissue]
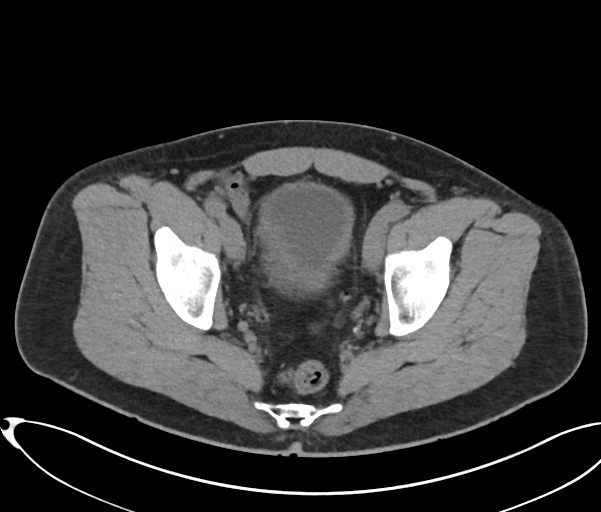
[im 38/97  soft-tissue]
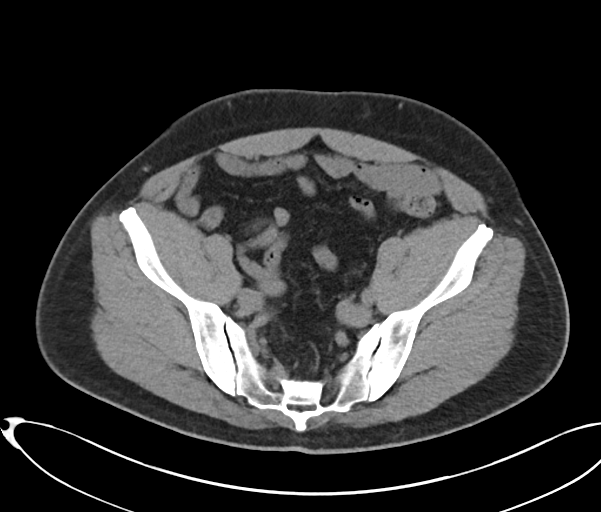
[im 46/97  soft-tissue]
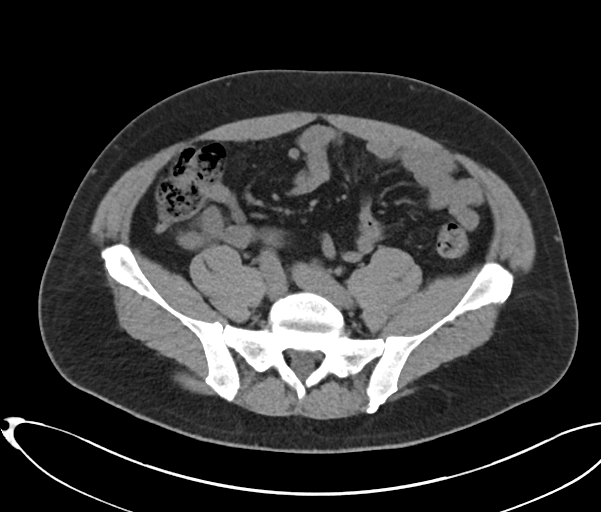
[im 51/97  soft-tissue]
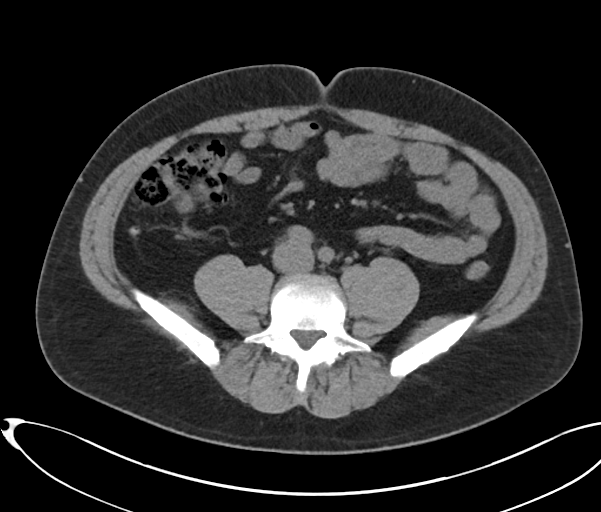
[im 59/97  soft-tissue]
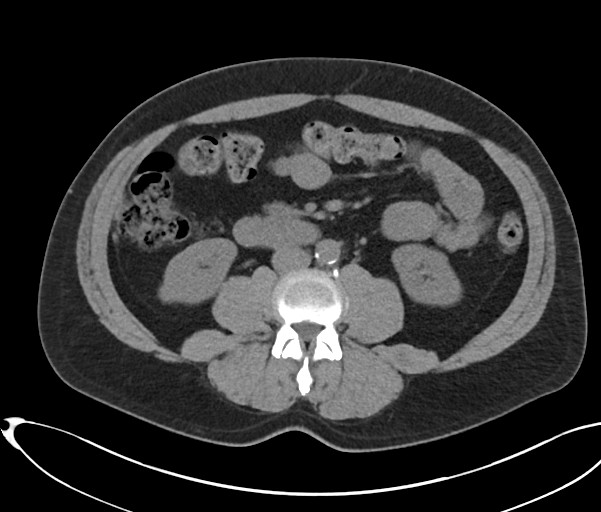
[im 67/97  soft-tissue]
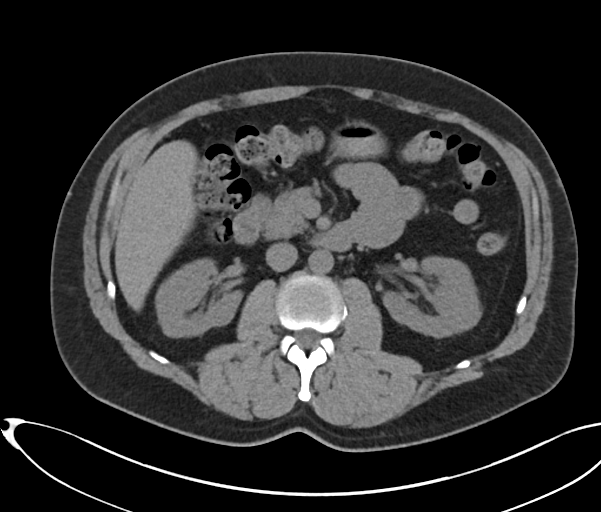
[im 67/97  bone]
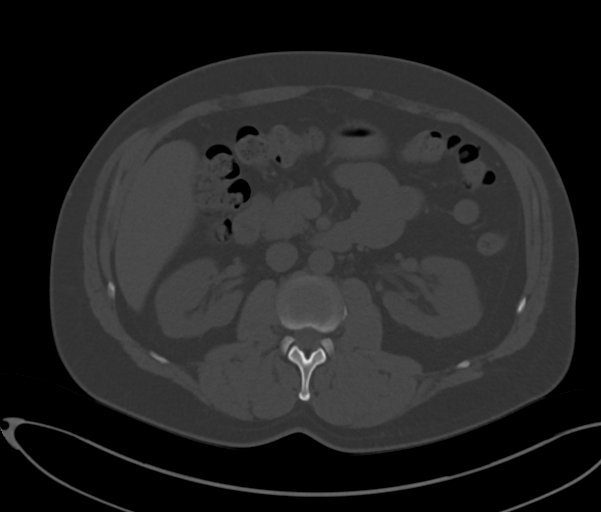
[im 76/97  soft-tissue]
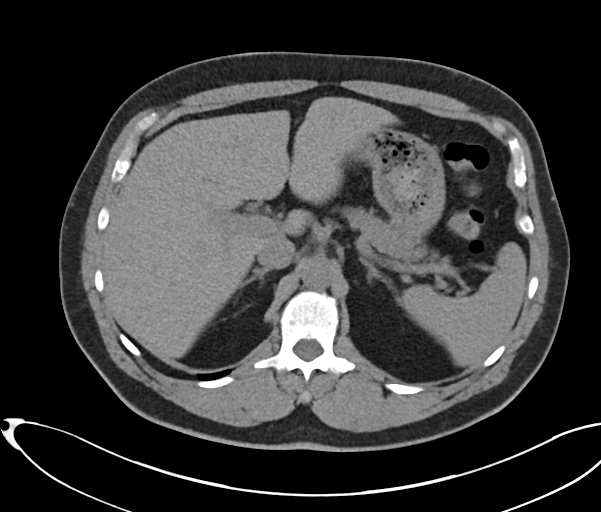
[im 84/97  soft-tissue]
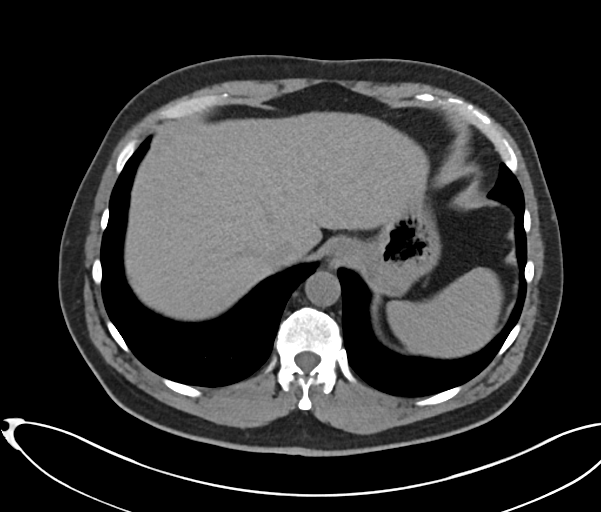
[im 92/97  soft-tissue]
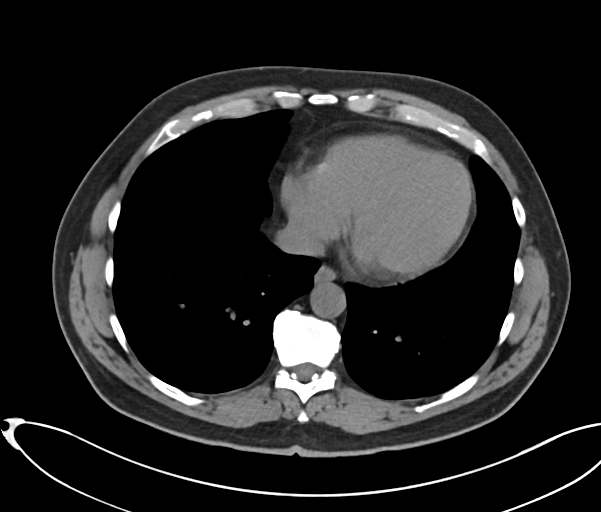

[Series 6: renal stone 2.00 br40 s3 cor · coronal · 0.83mm/px · 3 of 186 slices shown]
[im 62/186  soft-tissue]
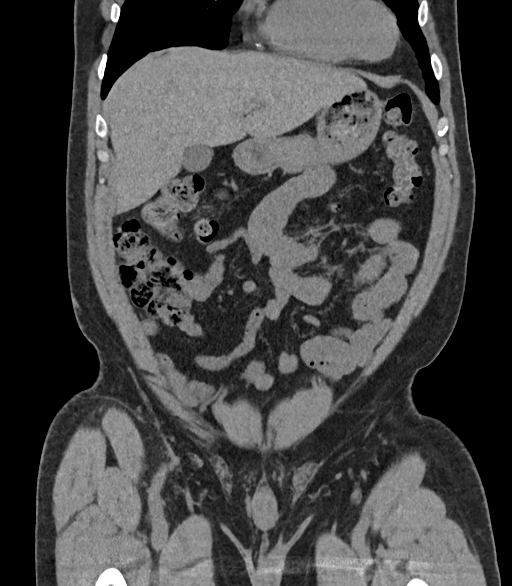
[im 83/186  soft-tissue]
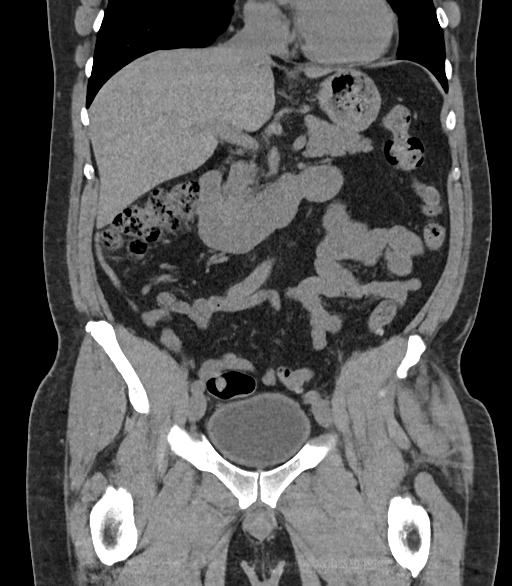
[im 103/186  soft-tissue]
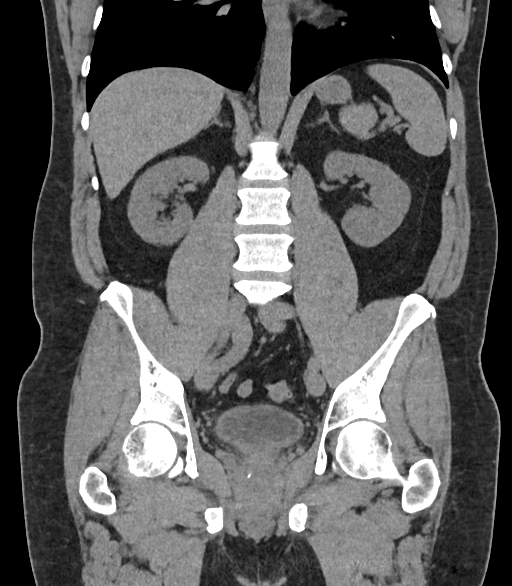

[15 of 46 positions shown; findings below may reference images not displayed]

FINDINGS: Lower chest: Clear lung bases.

Hepatobiliary: No focal liver abnormality is seen. No gallstones,
gallbladder wall thickening, or biliary dilatation.

Pancreas: Unremarkable.

Spleen: Unremarkable.

Adrenals/Urinary Tract: Unremarkable adrenal glands. No evidence of
renal mass, calculi, or hydronephrosis. Unremarkable bladder.

Stomach/Bowel: The stomach is unremarkable. There is no evidence of
bowel obstruction or inflammation. Left-sided colonic diverticulosis
is noted without evidence of acute diverticulitis. The appendix is
unremarkable.

Vascular/Lymphatic: Mild abdominal aortic atherosclerosis without
aneurysm. No enlarged lymph nodes.

Reproductive: Unremarkable prostate.

Other: No ascites or pneumoperitoneum.

Musculoskeletal: No acute osseous abnormality or suspicious osseous
lesion.
IMPRESSION: 1. No evidence of urinary tract calculi, obstruction, or other acute
abnormality in the abdomen or pelvis.
2. Colonic diverticulosis.
3. Aortic Atherosclerosis ([id number]).

## 2021-12-06 ENCOUNTER — Other Ambulatory Visit (HOSPITAL_COMMUNITY): Payer: Self-pay

## 2021-12-06 ENCOUNTER — Other Ambulatory Visit: Payer: Self-pay

## 2021-12-06 ENCOUNTER — Ambulatory Visit: Payer: Commercial Managed Care - PPO | Attending: Nurse Practitioner | Admitting: Nurse Practitioner

## 2021-12-06 ENCOUNTER — Encounter: Payer: Self-pay | Admitting: Nurse Practitioner

## 2021-12-06 VITALS — BP 140/94 | HR 76 | Ht 71.0 in | Wt 211.2 lb

## 2021-12-06 DIAGNOSIS — E7849 Other hyperlipidemia: Secondary | ICD-10-CM

## 2021-12-06 DIAGNOSIS — I10 Essential (primary) hypertension: Secondary | ICD-10-CM | POA: Diagnosis not present

## 2021-12-06 DIAGNOSIS — Z8249 Family history of ischemic heart disease and other diseases of the circulatory system: Secondary | ICD-10-CM

## 2021-12-06 DIAGNOSIS — E785 Hyperlipidemia, unspecified: Secondary | ICD-10-CM | POA: Diagnosis not present

## 2021-12-06 DIAGNOSIS — Z8673 Personal history of transient ischemic attack (TIA), and cerebral infarction without residual deficits: Secondary | ICD-10-CM | POA: Diagnosis not present

## 2021-12-06 DIAGNOSIS — E7800 Pure hypercholesterolemia, unspecified: Secondary | ICD-10-CM

## 2021-12-06 DIAGNOSIS — Z79899 Other long term (current) drug therapy: Secondary | ICD-10-CM

## 2021-12-06 MED ORDER — LOSARTAN POTASSIUM 25 MG PO TABS
25.0000 mg | ORAL_TABLET | Freq: Every day | ORAL | 3 refills | Status: DC
  Filled 2021-12-06: qty 30, 30d supply, fill #0
  Filled 2021-12-06: qty 90, 90d supply, fill #0

## 2021-12-06 MED ORDER — PRALUENT 150 MG/ML ~~LOC~~ SOAJ
SUBCUTANEOUS | 11 refills | Status: DC
  Filled 2021-12-06: qty 2, 28d supply, fill #0

## 2021-12-06 MED ORDER — ROSUVASTATIN CALCIUM 40 MG PO TABS
40.0000 mg | ORAL_TABLET | Freq: Every day | ORAL | 3 refills | Status: DC
  Filled 2021-12-06: qty 90, 90d supply, fill #0
  Filled 2021-12-06: qty 30, 30d supply, fill #0

## 2021-12-06 NOTE — Patient Instructions (Addendum)
Medication Instructions:  Losartan 25 mg daily.  *If you need a refill on your cardiac medications before your next appointment, please call your pharmacy*   Lab Work: Your physician recommends that you return for lab work in 2 weeks and 1 week before [employer]. BMET (2 weeks) NMR Lipo Profile (1 week before seeing Dr. Debara Pickett) If you have labs (blood work) drawn today and your tests are completely normal, you will receive your results only by: [place] (if you have MyChart) OR A paper copy in the mail If you have any lab test that is abnormal or we need to change your treatment, we will call you to review the results.   Testing/Procedures: NONE ordered at this time of appointment    Follow-Up: At [facility], you and your health needs are our priority.  As part of our continuing mission to provide you with exceptional heart care, we have created designated Provider Care Teams.  These Care Teams include your primary Cardiologist (physician) and Advanced Practice Providers (APPs -  Physician Assistants and Nurse Practitioners) who all work together to provide you with the care you need, when you need it.  We recommend signing up for the patient portal called "MyChart".  Sign up information is provided on this After Visit Summary.  MyChart is used to connect with patients for Virtual Visits (Telemedicine).  Patients are able to view lab/test results, encounter notes, upcoming appointments, etc.  Non-urgent messages can be sent to your provider as well.   To learn more about what you can do with MyChart, go to [email or web address].    Your next appointment:   1 month(s) Raquel Sarna) 2 months [facility])   The format for your next appointment:   In Person  Provider:   Diona Browner, NP        Other Instructions   Important Information About [place]

## 2021-12-06 NOTE — Progress Notes (Signed)
Office Visit    Patient Name: Brad Holmes Date of Encounter: 12/06/2021  Primary Care Provider:  Haydee Salter, MD Primary Cardiologist:  Pixie Casino, MD  Chief Complaint    51 year old male with a history of hypertension, familial hyperlipidemia, family history of premature CAD, and TIA who presents for follow-up related to hypertension and hyperlipidemia.   Past Medical History    Past Medical History:  Diagnosis Date   History of COVID-19    home positive covid test 10 weeks ago per pt on 01-09-2021 cold sympptoms fever congestion x 5 days all symptoms resolved   Hypercholesterolemia    Hypertension    Stroke ([place]) 11/14/2018   antibody test was positive never tested positive for covid, no residual deficit from right cr stroke   Urethral stricture 01/09/2021   bulbar, hematuria with occ clots   Past Surgical History:  Procedure Laterality Date   CARDIAC CATHETERIZATION     05/12/09   CYSTOSCOPY WITH URETHRAL DILATATION N/A 01/13/2021   Procedure: CYSTOSCOPY WITH, BALLOON  URETHRAL DILATATION , BILATERAL RETROGRADES, BLADDER BIOPSIES;  Surgeon: Irine Seal, MD;  Location: [place];  Service: Urology;  Laterality: N/A;   EYE SURGERY Left 2017   macular traction surgery   IR 3D INDEPENDENT WKST  11/24/2018   IR ANGIO INTRA EXTRACRAN SEL COM CAROTID INNOMINATE BILAT MOD SED  11/14/2018   IR ANGIO INTRA EXTRACRAN SEL INTERNAL CAROTID UNI R MOD SED  11/24/2018   IR ANGIO VERTEBRAL SEL SUBCLAVIAN INNOMINATE UNI R MOD SED  11/24/2018   IR ANGIO VERTEBRAL SEL VERTEBRAL BILAT MOD SED  11/14/2018   IR NEURO EACH ADD'L AFTER BASIC UNI RIGHT (MS)  11/24/2018   RADIOLOGY WITH ANESTHESIA N/A 11/24/2018   Procedure: RADIOLOGY WITH ANESTHESIA  STENTING;  Surgeon: Luanne Bras, MD;  Location: [place];  Service: Radiology;  Laterality: N/A;   WISDOM TOOTH EXTRACTION     age 89    Allergies  Allergies  Allergen Reactions   No Known Allergies      History of Present Illness    51 year old male with the above past medical history including hypertension, familial hyperlipidemia, family history of premature CAD, and TIA.  He has a longstanding history of dyslipidemia with a significant family history of heart disease (his father died in his late 30s of a massive heart attack).  Cardiac catheterization in 2011 showed no significant CAD.  He does have a history of TIA in 2020.  Echocardiogram 11/2018 showed EF 55 to 60%, normal LV function, no RWMA, normal RV systolic function, no significant valvular abnormalities.  He has not tolerated high intensity statins and was started on Praluent.  Seen in the office on 08/10/2019 and was stable from a cardiac standpoint.  He presents today for follow-up.  Since his last visit been stable from a cardiac standpoint.  Unfortunately, he has been unable to get refills of his Praluent and Crestor as he was told he needed updated lab work.  He has noted some left shoulder pain, however, this is entirely positional and occurs when he lays on his left arm at night or with certain movements. He plans to follow-up with orthopedics for this. He denies any symptoms concerning for angina.  He is eager to get back on his cholesterol medication.  Otherwise, he reports feeling well and denies any additional concerns today.  Home Medications    Current Outpatient Medications  Medication Sig Dispense Refill   losartan (COZAAR) 25 MG  tablet Take 1 tablet (25 mg total) by mouth daily. 90 tablet 3   PRALUENT 150 MG/ML SOAJ INJECT 1 DOSE INTO THE SKIN EVERY 14 DAYS AS DIRECTED 2 mL 11   rosuvastatin (CRESTOR) 40 MG tablet Take 1 tablet (40 mg total) by mouth daily. 90 tablet 3   No current facility-administered medications for this visit.     Review of Systems    He denies chest pain, palpitations, dyspnea, pnd, orthopnea, n, v, dizziness, syncope, edema, weight gain, or early satiety. All other systems reviewed and are  otherwise negative except as noted above.   Physical Exam    VS:  BP (!) 140/94   Pulse 76   Ht '5\' 11"'$  (1.803 m)   Wt 211 lb 3.2 oz (95.8 kg)   BMI 29.46 kg/m   GEN: Well nourished, well developed, in no acute distress. HEENT: normal. Neck: Supple, no JVD, carotid bruits, or masses. Cardiac: RRR, no murmurs, rubs, or gallops. No clubbing, cyanosis, edema.  Radials/DP/PT 2+ and equal bilaterally.  Respiratory:  Respirations regular and unlabored, clear to auscultation bilaterally. GI: Soft, nontender, nondistended, BS + x 4. MS: no deformity or atrophy. Skin: warm and dry, no rash. Neuro:  Strength and sensation are intact. Psych: Normal affect.  Accessory Clinical Findings    ECG personally reviewed by me today -NSR, 76 bpm- no acute changes.   Lab Results  Component Value Date   WBC 7.1 12/15/2020   HGB 15.1 12/15/2020   HCT 45.5 12/15/2020   MCV 87.5 12/15/2020   PLT 257.0 12/15/2020   Lab Results  Component Value Date   CREATININE 1.08 08/22/2021   BUN 11 08/22/2021   NA 138 08/22/2021   K 3.7 08/22/2021   CL 101 08/22/2021   CO2 28 08/22/2021   Lab Results  Component Value Date   ALT 38 08/22/2021   AST 20 08/22/2021   ALKPHOS 86 08/22/2021   BILITOT 0.4 08/22/2021   Lab Results  Component Value Date   CHOL 378 (H) 08/22/2021   HDL 40.60 08/22/2021   LDLCALC 145 (H) 11/14/2018   LDLDIRECT 285.0 08/22/2021   TRIG 248.0 (H) 08/22/2021   CHOLHDL 9 08/22/2021    Lab Results  Component Value Date   HGBA1C 5.5 11/14/2018    Assessment & Plan    1. Hyperlipidemia: He has been off his Praluent for approximately 10 months and off his Crestor for 2 months.  He states he was unable to get this refilled due to need for lab work.  However, it appears he had labs with his PCP in June 2023.  Cholesterol was 378, triglycerides were 248, direct LDL was 285.  We will have him follow-up with Dr. Debara Pickett in the lipid clinic.  Will have him check NMR, lipid profile 1  week prior to that visit.  Will refill Praluent, Crestor.  2. Hypertension: Per prior neurology notes following his TIA in 2020, BP goal was 130-150 SBP. BP elevated above goal.  Will restart losartan 25 mg daily.  Continue to monitor BP. Will check BMET in 2 weeks.   3. Family history of CAD: Strong family history of CAD.  Cardiac catheterization in 2011 showed no significant CAD.  He denies symptoms concerning for angina.  Continue Praluent, Crestor.  4. H/o TIA: He is no longer on aspirin or Plavix.  Will restart Praluent and Crestor as above.  5. Disposition: F/u in 1 month with APP, follow-up and with Dr. Debara Pickett in 2 months for  lipid clinic.  HYPERTENSION CONTROL Vitals:   12/06/21 0824 12/06/21 0849  BP: (!) 146/90 (!) 140/94    The patient's blood pressure is elevated above target today.  In order to address the patient's elevated BP: A new medication was prescribed today.; Follow up with general cardiology has been recommended.      Lenna Sciara, NP 12/06/2021, 9:05 AM

## 2021-12-07 ENCOUNTER — Other Ambulatory Visit (HOSPITAL_COMMUNITY): Payer: Self-pay

## 2021-12-08 ENCOUNTER — Other Ambulatory Visit (HOSPITAL_COMMUNITY): Payer: Self-pay

## 2021-12-08 ENCOUNTER — Other Ambulatory Visit: Payer: Self-pay

## 2021-12-08 DIAGNOSIS — E785 Hyperlipidemia, unspecified: Secondary | ICD-10-CM

## 2021-12-08 MED ORDER — PRALUENT 150 MG/ML ~~LOC~~ SOAJ: SUBCUTANEOUS | 11 refills | Status: DC

## 2021-12-08 MED ORDER — ROSUVASTATIN CALCIUM 40 MG PO TABS: 40.0000 mg | ORAL_TABLET | Freq: Every day | ORAL | 3 refills | Status: DC

## 2021-12-08 MED ORDER — LOSARTAN POTASSIUM 25 MG PO TABS: 25.0000 mg | ORAL_TABLET | Freq: Every day | ORAL | 3 refills | Status: DC

## 2021-12-12 ENCOUNTER — Encounter: Payer: Self-pay | Admitting: Internal Medicine

## 2021-12-15 ENCOUNTER — Telehealth: Payer: Self-pay | Admitting: Internal Medicine

## 2021-12-15 NOTE — Telephone Encounter (Signed)
Attempted PA for [employer] prefers Repatha PA started: (Key: [id number]) - submitted and waiting on response  Per last labs, had been off statin + Praluent as LDL was 285 in summer 2023

## 2021-12-15 NOTE — Telephone Encounter (Signed)
Pt c/o medication issue:  1. Name of Medication: PRALUENT 150 MG/ML SOAJ  2. How are you currently taking this medication (dosage and times per day)? As prescribed  3. Are you having a reaction (difficulty breathing--STAT)?   4. What is your medication issue? [facility] pharmacy calling to get prior auth for this medication and also to say that  Repatha [employer] is preferred, but also may need auth.    RX # [id number] RX Store # [phone]

## 2021-12-21 MED ORDER — REPATHA SURECLICK 140 MG/ML ~~LOC~~ SOAJ: 1.0000 mL | SUBCUTANEOUS | 0 refills | Status: DC

## 2021-12-21 NOTE — Addendum Note (Signed)
Addended by: Fidel Levy on: 12/21/2021 11:04 AM   Modules accepted: Orders

## 2021-12-21 NOTE — Telephone Encounter (Addendum)
No response in CMM on PA request [facility] at [phone] and completed new PA on the phone  Probable FH - [place] score 7 (per calculator on web) LDL 285 on recent labs Premature cerebrovascular disease Premature CAD in 1st degree relative (mom)  Case# [id number]  Approved: 11/21/2021 -- 12/21/2022  Total time: 17 minutes

## 2022-01-08 ENCOUNTER — Other Ambulatory Visit: Payer: Self-pay

## 2022-01-08 ENCOUNTER — Ambulatory Visit: Payer: Commercial Managed Care - PPO | Attending: Nurse Practitioner | Admitting: Nurse Practitioner

## 2022-01-08 ENCOUNTER — Encounter: Payer: Self-pay | Admitting: Nurse Practitioner

## 2022-01-08 VITALS — BP 140/80 | HR 78 | Ht 71.0 in | Wt 215.0 lb

## 2022-01-08 DIAGNOSIS — E7849 Other hyperlipidemia: Secondary | ICD-10-CM

## 2022-01-08 DIAGNOSIS — I10 Essential (primary) hypertension: Secondary | ICD-10-CM | POA: Diagnosis not present

## 2022-01-08 DIAGNOSIS — Z8673 Personal history of transient ischemic attack (TIA), and cerebral infarction without residual deficits: Secondary | ICD-10-CM

## 2022-01-08 DIAGNOSIS — Z8249 Family history of ischemic heart disease and other diseases of the circulatory system: Secondary | ICD-10-CM

## 2022-01-08 NOTE — Patient Instructions (Signed)
Medication Instructions:  No Changes *If you need a refill on your cardiac medications before your next appointment, please call your pharmacy*   Lab Work: BMET Today Lipid Panel, Hepatic panel ( 1 week prior to follow up visit with Dr. Debara Pickett). If you have labs (blood work) drawn today and your tests are completely normal, you will receive your results only by: [place] (if you have MyChart) OR A paper copy in the mail If you have any lab test that is abnormal or we need to change your treatment, we will call you to review the results.   Testing/Procedures: No Testing   Follow-Up: At [facility], you and your health needs are our priority.  As part of our continuing mission to provide you with exceptional heart care, we have created designated Provider Care Teams.  These Care Teams include your primary Cardiologist (physician) and Advanced Practice Providers (APPs -  Physician Assistants and Nurse Practitioners) who all work together to provide you with the care you need, when you need it.  We recommend signing up for the patient portal called "MyChart".  Sign up information is provided on this After Visit Summary.  MyChart is used to connect with patients for Virtual Visits (Telemedicine).  Patients are able to view lab/test results, encounter notes, upcoming appointments, etc.  Non-urgent messages can be sent to your provider as well.   To learn more about what you can do with MyChart, go to [email or web address].    Your next appointment:   Keep Scheduled Follow up Appointment  The format for your next appointment:   In Person  Provider:   Pixie Casino, MD

## 2022-01-08 NOTE — Progress Notes (Signed)
Office Visit    Patient Name: Brad Holmes Date of Encounter: 01/08/2022  Primary Care Provider:  Haydee Salter, MD Primary Cardiologist:  Pixie Casino, MD  Chief Complaint    51 year old male with a history of hypertension, familial hyperlipidemia, family history of premature CAD, and TIA who presents for follow-up related to hypertension and hyperlipidemia.   Past Medical History    Past Medical History:  Diagnosis Date   History of COVID-19    home positive covid test 10 weeks ago per pt on 01-09-2021 cold sympptoms fever congestion x 5 days all symptoms resolved   Hypercholesterolemia    Hypertension    Stroke ([place]) 11/14/2018   antibody test was positive never tested positive for covid, no residual deficit from right cr stroke   Urethral stricture 01/09/2021   bulbar, hematuria with occ clots   Past Surgical History:  Procedure Laterality Date   CARDIAC CATHETERIZATION     05/12/09   CYSTOSCOPY WITH URETHRAL DILATATION N/A 01/13/2021   Procedure: CYSTOSCOPY WITH, BALLOON  URETHRAL DILATATION , BILATERAL RETROGRADES, BLADDER BIOPSIES;  Surgeon: Irine Seal, MD;  Location: [place];  Service: Urology;  Laterality: N/A;   EYE SURGERY Left 2017   macular traction surgery   IR 3D INDEPENDENT WKST  11/24/2018   IR ANGIO INTRA EXTRACRAN SEL COM CAROTID INNOMINATE BILAT MOD SED  11/14/2018   IR ANGIO INTRA EXTRACRAN SEL INTERNAL CAROTID UNI R MOD SED  11/24/2018   IR ANGIO VERTEBRAL SEL SUBCLAVIAN INNOMINATE UNI R MOD SED  11/24/2018   IR ANGIO VERTEBRAL SEL VERTEBRAL BILAT MOD SED  11/14/2018   IR NEURO EACH ADD'L AFTER BASIC UNI RIGHT (MS)  11/24/2018   RADIOLOGY WITH ANESTHESIA N/A 11/24/2018   Procedure: RADIOLOGY WITH ANESTHESIA  STENTING;  Surgeon: Luanne Bras, MD;  Location: [place];  Service: Radiology;  Laterality: N/A;   WISDOM TOOTH EXTRACTION     age 77    Allergies  Allergies  Allergen Reactions   No Known Allergies      History of Present Illness    51 year old male with the above past medical history including hypertension, familial hyperlipidemia, family history of premature CAD, and TIA.   He has a longstanding history of dyslipidemia with a significant family history of heart disease (his father died in his late 41s of a massive heart attack).  Cardiac catheterization in 2011 showed no significant CAD.  He does have a history of TIA in 2020.  Echocardiogram 11/2018 showed EF 55 to 60%, normal LV function, no RWMA, normal RV systolic function, no significant valvular abnormalities.  He has not tolerated high intensity statins and was started on Praluent.    He was last seen in the office on 12/06/2021 and was stable from a cardiac standpoint.  He had been off his Praluent and Crestor.  He did note some left shoulder pain, that was entirely positional (he planned to follow-up with orthopedics for this).  He denied symptoms concerning for angina.  He was restarted on Crestor and Praluent.   He presents today for follow-up.  Since his last visit been stable from a cardiac standpoint.  Unfortunately, he never started his Praluent.  In reviewing his chart, it appears that insurance did not approve Praluent and the recommendation was made to switch to [place].  Patient was unaware of this recommendation and therefore has not re-started PCSK9 inhibitor therapy.  He notes occasional headaches that occur mostly in the middle of the night,  and fleeting palpitations.  Otherwise, he reports feeling well denies any additional concerns today.  Home Medications    Current Outpatient Medications  Medication Sig Dispense Refill   losartan (COZAAR) 25 MG tablet Take 1 tablet (25 mg total) by mouth daily. 90 tablet 3   rosuvastatin (CRESTOR) 40 MG tablet Take 1 tablet (40 mg total) by mouth daily. 90 tablet 3   No current facility-administered medications for this visit.     Review of Systems   He denies chest pain,  palpitations, dyspnea, pnd, orthopnea, n, v, dizziness, syncope, edema, weight gain, or early satiety. All other systems reviewed and are otherwise negative except as noted above.   Physical Exam    VS:  BP (!) 140/80 (BP Location: Left Arm, Patient Position: Sitting, Cuff Size: Normal)   Pulse 78   Ht '5\' 11"'$  (1.803 m)   Wt 215 lb (97.5 kg)   BMI 29.99 kg/m   GEN: Well nourished, well developed, in no acute distress. HEENT: normal. Neck: Supple, no JVD, carotid bruits, or masses. Cardiac: RRR, no murmurs, rubs, or gallops. No clubbing, cyanosis, edema.  Radials/DP/PT 2+ and equal bilaterally.  Respiratory:  Respirations regular and unlabored, clear to auscultation bilaterally. GI: Soft, nontender, nondistended, BS + x 4. MS: no deformity or atrophy. Skin: warm and dry, no rash. Neuro:  Strength and sensation are intact. Psych: Normal affect.  Accessory Clinical Findings    ECG personally reviewed by me today - No EKG in office today.    Lab Results  Component Value Date   WBC 7.1 12/15/2020   HGB 15.1 12/15/2020   HCT 45.5 12/15/2020   MCV 87.5 12/15/2020   PLT 257.0 12/15/2020   Lab Results  Component Value Date   CREATININE 1.08 08/22/2021   BUN 11 08/22/2021   NA 138 08/22/2021   K 3.7 08/22/2021   CL 101 08/22/2021   CO2 28 08/22/2021   Lab Results  Component Value Date   ALT 38 08/22/2021   AST 20 08/22/2021   ALKPHOS 86 08/22/2021   BILITOT 0.4 08/22/2021   Lab Results  Component Value Date   CHOL 378 (H) 08/22/2021   HDL 40.60 08/22/2021   LDLCALC 145 (H) 11/14/2018   LDLDIRECT 285.0 08/22/2021   TRIG 248.0 (H) 08/22/2021   CHOLHDL 9 08/22/2021    Lab Results  Component Value Date   HGBA1C 5.5 11/14/2018    Assessment & Plan   1. Hyperlipidemia: He has been off his Praluent for almost a year.  He was also off Crestor for several months.  He has since restarted Crestor.  Insurance did not approve Praluent therefore, he was transitioned to  [place].  Patient was unaware of this change.  He states he will pick up his prescription and begin taking Repatha immediately.  Labs with his PCP in June 2023 showed cholesterol was 378, triglycerides were 248, direct LDL was 285.  He has follow-up scheduled with Dr. Debara Pickett in 02/2022.  Will check labs 1 week prior to that visit.  Continue Repatha, Crestor Crestor.   2. Hypertension: BP well controlled. Continue current antihypertensive regimen.  Check BMET today given recent reintroduction of losartan.    3. Family history of CAD: Strong family history of CAD.  Cardiac catheterization in 2011 showed no significant CAD.  He denies symptoms concerning for angina.  Continue Repatha, Crestor.   4. H/o TIA: He is no longer on aspirin or Plavix. Continue Repatha and Crestor as above.   5.  Disposition: F/u as scheduled with Dr. Debara Pickett in 1 month.    Lenna Sciara, NP 01/08/2022, 7:49 PM

## 2022-01-09 LAB — BASIC METABOLIC PANEL
BUN/Creatinine Ratio: 9 (ref 9–20)
BUN: 12 mg/dL (ref 6–24)
CO2: 22 mmol/L (ref 20–29)
Calcium: 9.9 mg/dL (ref 8.7–10.2)
Chloride: 102 mmol/L (ref 96–106)
Creatinine, Ser: 1.41 mg/dL — ABNORMAL HIGH (ref 0.76–1.27)
Glucose: 92 mg/dL (ref 70–99)
Potassium: 4.2 mmol/L (ref 3.5–5.2)
Sodium: 140 mmol/L (ref 134–144)
eGFR: 60 mL/min/{1.73_m2} (ref >59)

## 2022-01-15 ENCOUNTER — Telehealth: Payer: Self-pay

## 2022-01-15 NOTE — Telephone Encounter (Signed)
Pt returned call. Discussed lab results and recommendations.

## 2022-01-15 NOTE — Telephone Encounter (Signed)
Lmom to discuss lab results and recommendations. Waiting on a return call.  

## 2022-01-15 NOTE — Telephone Encounter (Signed)
  Pt is returning call. He said, to call him at [phone]

## 2022-02-07 LAB — BASIC METABOLIC PANEL
BUN/Creatinine Ratio: 11 (ref 9–20)
BUN: 11 mg/dL (ref 6–24)
CO2: 25 mmol/L (ref 20–29)
Calcium: 10 mg/dL (ref 8.7–10.2)
Chloride: 104 mmol/L (ref 96–106)
Creatinine, Ser: 1.01 mg/dL (ref 0.76–1.27)
Glucose: 87 mg/dL (ref 70–99)
Potassium: 4.6 mmol/L (ref 3.5–5.2)
Sodium: 142 mmol/L (ref 134–144)
eGFR: 89 mL/min/{1.73_m2} (ref >59)

## 2022-02-07 LAB — LIPID PANEL
Chol/HDL Ratio: 3.8 ratio (ref 0.0–5.0)
Cholesterol, Total: 173 mg/dL (ref 100–199)
HDL: 45 mg/dL (ref >39)
LDL Chol Calc (NIH): 106 mg/dL — ABNORMAL HIGH (ref 0–99)
Triglycerides: 124 mg/dL (ref 0–149)
VLDL Cholesterol Cal: 22 mg/dL (ref 5–40)

## 2022-02-07 LAB — HEPATIC FUNCTION PANEL
ALT: 75 IU/L — ABNORMAL HIGH (ref 0–44)
AST: 27 IU/L (ref 0–40)
Albumin: 4.5 g/dL (ref 3.8–4.9)
Alkaline Phosphatase: 94 IU/L (ref 44–121)
Bilirubin Total: 0.6 mg/dL (ref 0.0–1.2)
Bilirubin, Direct: 0.16 mg/dL (ref 0.00–0.40)
Total Protein: 6.7 g/dL (ref 6.0–8.5)

## 2022-02-08 LAB — NMR, LIPOPROFILE
Cholesterol, Total: 180 mg/dL (ref 100–199)
HDL Particle Number: 33.3 umol/L (ref >=30.5)
HDL-C: 44 mg/dL (ref >39)
LDL Particle Number: 1162 nmol/L — ABNORMAL HIGH (ref <1000)
LDL Size: 20.6 nm (ref >20.5)
LDL-C (NIH Calc): 113 mg/dL — ABNORMAL HIGH (ref 0–99)
LP-IR Score: 71 — ABNORMAL HIGH (ref <=45)
Small LDL Particle Number: 645 nmol/L — ABNORMAL HIGH (ref <=527)
Triglycerides: 128 mg/dL (ref 0–149)

## 2022-02-09 ENCOUNTER — Encounter: Payer: Self-pay | Admitting: Internal Medicine

## 2022-02-09 ENCOUNTER — Ambulatory Visit: Payer: Commercial Managed Care - PPO | Attending: Internal Medicine | Admitting: Internal Medicine

## 2022-02-09 VITALS — BP 140/88 | HR 95 | Ht 71.0 in | Wt 215.4 lb

## 2022-02-09 DIAGNOSIS — Z8673 Personal history of transient ischemic attack (TIA), and cerebral infarction without residual deficits: Secondary | ICD-10-CM | POA: Diagnosis not present

## 2022-02-09 DIAGNOSIS — E7849 Other hyperlipidemia: Secondary | ICD-10-CM | POA: Diagnosis not present

## 2022-02-09 DIAGNOSIS — R748 Abnormal levels of other serum enzymes: Secondary | ICD-10-CM | POA: Diagnosis not present

## 2022-02-09 DIAGNOSIS — E785 Hyperlipidemia, unspecified: Secondary | ICD-10-CM

## 2022-02-09 NOTE — Progress Notes (Signed)
LIPID CLINIC NOTE  Chief Complaint:  Follow-up dyslipidemia  Primary Care Physician: Brad Salter, MD  Primary Cardiologist:  Brad Casino, MD  HPI:  Brad Holmes is a 51 y.o. male who was recently seen in a telemedicine visit for the evaluation and management of dyslipidemia.  He has a longstanding history of dyslipidemia probably dating back to his knowledge to his teenage years.  Unfortunately has a significant family history of heart disease in his father side including his father who died in his late 26s of a massive heart attack and his grandfather who is had bypass surgery (I believe) and numerous stents.  Brad Holmes first found out about his cholesterol interestingly when he was [place].  He was found to have a significantly elevated cholesterol at the time.  In the past he has been on atorvastatin which caused significant side effects however seems to be tolerating rosuvastatin.  He recently established care here and was married this past November.  His wife is a Designer, jewellery I believe in adolescent care with the [facility].  Lab work from June showed total cholesterol 361, triglycerides 303, HDL 40, LDL 260.  Of note he was not fasting during the study.  He did have some other labs done 9 years ago which showed a total cholesterol of 233 with triglycerides 36, HDL 35 and LDL 191.  At that time he had suffered a viral illness and had chest pain with an elevated troponin.  He underwent heart catheterization by Brad Holmes which demonstrated no significant coronary disease.  He continues to report being asymptomatic however he has several children and recently being remarried is concerned about his cardiovascular risk and wishes to prevent events if possible.  12/02/2018  Brad Holmes returns today for follow-up of dyslipidemia.  He is tolerating rosuvastatin 40 mg and has had a significant decline in LDL cholesterol.  His total cholesterol 212,  triglycerides 171, HDL 33 and LDL 145 (decreased from 260).  Unfortunately, he suffered a TIA/stroke.  He ultimately had to undergo a cerebral angiography and was then found to have a moderate stenosis of the MCA.  He was also given TPA.  There were actually 2 back-to-back episodes.  Fortunately he has had neurologic recovery.  This does underscore the significant increased risk that he faces.  02/17/2019  Brad Holmes is seen today in follow-up.  He has had excellent response to lipid therapy.  His LDL P now is 597, LDL-C of 41, HDL 43 and triglycerides 99.  Small LDL-P3 137.  He is also had a decrease in LP(a) from 65.8-50.2, which likely is related to the Praluent.  Overall this is a very favorable lipid profile which we will need to maintain.  Recently reached out that he has been having some headaches.  Blood pressure is also been labile and was ranged from the 130s up to 160s.  I do think he would benefit from better blood pressure control..  Previously he was on some blood pressure medication but this was stopped apparently by neurology.  There was some confusion per neuroradiology as to why he was off his medicine.  02/09/2022  Brad Holmes is seen today in follow-up.  He has seen Brad Browner, NP twice recently.  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  He was switched to [place] but his numbers are not quite as good.  He was also off of all of  his meds for a period of time.  Specifically the Crestor he was off of for about 4 months and is now only been on it for about a month therefore I suspect his lipids are not as low as they would be in 2 to 3 months from now.  He did have a small increase in ALT to 75 however the ALT had previously been normal except for an isolated 89 about 3 years ago.  No other good explanation as to why this is elevated.  He does not use alcohol.  His most recent lipid NMR showed a particle number of 1162, LDL 113.  PMHx:  Past Medical History:   Diagnosis Date   History of COVID-19    home positive covid test 10 weeks ago per pt on 01-09-2021 cold sympptoms fever congestion x 5 days all symptoms resolved   Hypercholesterolemia    Hypertension    Stroke ([place]) 11/14/2018   antibody test was positive never tested positive for covid, no residual deficit from right cr stroke   Urethral stricture 01/09/2021   bulbar, hematuria with occ clots    Past Surgical History:  Procedure Laterality Date   CARDIAC CATHETERIZATION     05/12/09   CYSTOSCOPY WITH URETHRAL DILATATION N/A 01/13/2021   Procedure: CYSTOSCOPY WITH, BALLOON  URETHRAL DILATATION , BILATERAL RETROGRADES, BLADDER BIOPSIES;  Surgeon: Brad Seal, MD;  Location: [facility];  Service: Urology;  Laterality: N/A;   EYE SURGERY Left 2017   macular traction surgery   IR 3D INDEPENDENT WKST  11/24/2018   IR ANGIO INTRA EXTRACRAN SEL COM CAROTID INNOMINATE BILAT MOD SED  11/14/2018   IR ANGIO INTRA EXTRACRAN SEL INTERNAL CAROTID UNI R MOD SED  11/24/2018   IR ANGIO VERTEBRAL SEL SUBCLAVIAN INNOMINATE UNI R MOD SED  11/24/2018   IR ANGIO VERTEBRAL SEL VERTEBRAL BILAT MOD SED  11/14/2018   IR NEURO EACH ADD'L AFTER BASIC UNI RIGHT (MS)  11/24/2018   RADIOLOGY WITH ANESTHESIA N/A 11/24/2018   Procedure: RADIOLOGY WITH ANESTHESIA  STENTING;  Surgeon: Brad Bras, MD;  Location: [facility];  Service: Radiology;  Laterality: N/A;   WISDOM TOOTH EXTRACTION     age 7    FAMHx:  Family History  Problem Relation Age of Onset   Cancer Mother        Lung   Hyperlipidemia Mother    Heart failure Father    Heart disease Father 65   Heart disease Paternal Uncle    Colon cancer Neg Hx    Colon polyps Neg Hx    Esophageal cancer Neg Hx    Rectal cancer Neg Hx    Stomach cancer Neg Hx     SOCHx:   reports that he has never smoked. He has never used smokeless tobacco. He reports current alcohol use. He reports that he does not use drugs.  ALLERGIES:  Allergies   Allergen Reactions   No Known Allergies     ROS: Pertinent items noted in HPI and remainder of comprehensive ROS otherwise negative.  HOME MEDS: Current Outpatient Medications on File Prior to Visit  Medication Sig Dispense Refill   losartan (COZAAR) 25 MG tablet Take 1 tablet (25 mg total) by mouth daily. 90 tablet 3   REPATHA SURECLICK 269 MG/ML SOAJ Inject 1 mL into the skin every 14 (fourteen) days.     rosuvastatin (CRESTOR) 40 MG tablet Take 1 tablet (40 mg total) by mouth daily. 90 tablet 3   No current facility-administered  medications on file prior to visit.    LABS/IMAGING: Results for orders placed or performed in visit on 01/08/22 (from the past 48 hour(s))  Lipid panel     Status: Abnormal   Collection Time: 02/07/22 11:35 AM  Result Value Ref Range   Cholesterol, Total 173 100 - 199 mg/dL   Triglycerides 124 0 - 149 mg/dL   HDL 45 >39 mg/dL   VLDL Cholesterol Cal 22 5 - 40 mg/dL   LDL Chol Calc (NIH) 106 (H) 0 - 99 mg/dL   Chol/HDL Ratio 3.8 0.0 - 5.0 ratio    Comment:                                   T. Chol/HDL Ratio                                             Men  Women                               1/2 Avg.Risk  3.4    3.3                                   Avg.Risk  5.0    4.4                                2X Avg.Risk  9.6    7.1                                3X Avg.Risk 23.4   11.0   Hepatic function panel     Status: Abnormal   Collection Time: 02/07/22 11:35 AM  Result Value Ref Range   Total Protein 6.7 6.0 - 8.5 g/dL   Albumin 4.5 3.8 - 4.9 g/dL   Bilirubin Total 0.6 0.0 - 1.2 mg/dL   Bilirubin, Direct 0.16 0.00 - 0.40 mg/dL   Alkaline Phosphatase 94 44 - 121 IU/L   AST 27 0 - 40 IU/L   ALT 75 (H) 0 - 44 IU/L   No results found.  LIPID PANEL:    Component Value Date/Time   CHOL 173 02/07/2022 1135   TRIG 124 02/07/2022 1135   HDL 45 02/07/2022 1135   CHOLHDL 3.8 02/07/2022 1135   CHOLHDL 9 08/22/2021 0851   VLDL 49.6 (H) 08/22/2021  0851   LDLCALC 106 (H) 02/07/2022 1135   LDLDIRECT 285.0 08/22/2021 0851    WEIGHTS: Wt Readings from Last 3 Encounters:  02/09/22 215 lb 6.4 oz (97.7 kg)  01/08/22 215 lb (97.5 kg)  12/06/21 211 lb 3.2 oz (95.8 kg)    VITALS: BP (!) 140/88   Pulse 95   Ht '5\' 11"'$  (1.803 m)   Wt 215 lb 6.4 oz (97.7 kg)   SpO2 95%   BMI 30.04 kg/m   EXAM: Deferred  EKG: Deferred  ASSESSMENT: Familial hyperlipidemia Recent TIA/stroke x2 Strong family history of premature coronary disease  Hypertension  PLAN: 1.   Brad Holmes had to switch his Praluent to [place] due to insurance  issues.  He was off of his losartan and his Crestor as well.  He restarted all of his medications but is only been on the statin for about a month.  LDL is not as low as it previously had been but I suspect this is because the statin has not had full benefit.  He did have an increase in ALT for unknown reasons.  Will plan to repeat liver enzymes, and A1c per his request and a repeat lipid profile in about 4 to 6 weeks.  I advised him to stay on his current therapies.  Follow-up with me in 6 months or sooner as necessary.  Brad Casino, MD, [facility], [place] Director of the [facility] Lipid Disorders &  Cardiovascular Risk Reduction Clinic Diplomate of the American Board of Clinical Lipidology Attending Cardiologist  Direct Dial: [phone]  Fax: [phone]  Website:  [email or web address] 02/09/2022, 8:34 AM

## 2022-02-09 NOTE — Patient Instructions (Signed)
Medication Instructions:  NO CHANGES  *If you need a refill on your cardiac medications before your next appointment, please call your pharmacy*   Lab Work: FASTING labs in 4-6 weeks  If you have labs (blood work) drawn today and your tests are completely normal, you will receive your results only by: [place] (if you have MyChart) OR A paper copy in the mail If you have any lab test that is abnormal or we need to change your treatment, we will call you to review the results.   Testing/Procedures: NONE   Follow-Up: At [facility], you and your health needs are our priority.  As part of our continuing mission to provide you with exceptional heart care, we have created designated Provider Care Teams.  These Care Teams include your primary Cardiologist (physician) and Advanced Practice Providers (APPs -  Physician Assistants and Nurse Practitioners) who all work together to provide you with the care you need, when you need it.  We recommend signing up for the patient portal called "MyChart".  Sign up information is provided on this After Visit Summary.  MyChart is used to connect with patients for Virtual Visits (Telemedicine).  Patients are able to view lab/test results, encounter notes, upcoming appointments, etc.  Non-urgent messages can be sent to your provider as well.   To learn more about what you can do with MyChart, go to [email or web address].    Your next appointment:   6 month(s)  The format for your next appointment:   In Person  Provider:   Pixie Casino, MD

## 2022-02-19 ENCOUNTER — Encounter: Payer: Self-pay | Admitting: Podiatry

## 2022-02-19 ENCOUNTER — Ambulatory Visit: Payer: Commercial Managed Care - PPO | Admitting: Podiatry

## 2022-02-19 VITALS — BP 175/89

## 2022-02-19 DIAGNOSIS — L600 Ingrowing nail: Secondary | ICD-10-CM

## 2022-02-19 NOTE — Progress Notes (Signed)
Subjective:   Patient ID: Brad Holmes, male   DOB: 51 y.o.   MRN: [id number]   HPI Patient presents stating has a painful ingrown toenail deformity of the right big toe and it has been going on for around a month.  Has tried to trim and soak it without relief   ROS      Objective:  Physical Exam  Neurovascular status intact incurvated medial border right hallux with patient who has good nail structure after laser therapy but is developed incurvation of the medial border with pain     Assessment:  Ingrown toenail deformity right hallux with pain     Plan:  H&P reviewed condition recommended correction and explained procedure risk to patient.  Patient wants surgery signed consent form after review and today I infiltrated the right hallux 60 mg like Marcaine mixture sterile prep done using sterile instrumentation remove the border exposed matrix applied phenol 3 applications 30 seconds followed by alcohol lavage sterile dressing gave instructions on soaks and to wear dressing 24 hours but take it off earlier if throbbing were to occur.  Encouraged him to call questions concerns which may arise

## 2022-02-19 NOTE — Patient Instructions (Signed)

## 2022-02-21 ENCOUNTER — Ambulatory Visit: Payer: Commercial Managed Care - PPO | Admitting: Family Medicine

## 2022-02-21 ENCOUNTER — Telehealth: Payer: Self-pay | Admitting: Family Medicine

## 2022-02-21 NOTE — Telephone Encounter (Signed)
1st no show, fee waived, letter sent 

## 2022-02-21 NOTE — Telephone Encounter (Signed)
Pt was a no show 12/20 for an OV with Dr. Gena Fray. This is his first, letter sent

## 2022-04-29 ENCOUNTER — Other Ambulatory Visit: Payer: Self-pay | Admitting: Internal Medicine

## 2022-06-07 ENCOUNTER — Encounter: Payer: Self-pay | Admitting: Gastroenterology

## 2022-06-07 ENCOUNTER — Ambulatory Visit (AMBULATORY_SURGERY_CENTER): Payer: Commercial Managed Care - PPO

## 2022-06-07 VITALS — Ht 71.0 in | Wt 213.0 lb

## 2022-06-07 DIAGNOSIS — Z1211 Encounter for screening for malignant neoplasm of colon: Secondary | ICD-10-CM

## 2022-06-07 MED ORDER — NA SULFATE-K SULFATE-MG SULF 17.5-3.13-1.6 GM/177ML PO SOLN: 1.0000 | Freq: Once | ORAL | 0 refills | Status: AC

## 2022-06-07 NOTE — Progress Notes (Signed)

## 2022-06-18 ENCOUNTER — Encounter: Payer: Self-pay | Admitting: *Deleted

## 2022-06-21 ENCOUNTER — Ambulatory Visit (AMBULATORY_SURGERY_CENTER): Payer: Commercial Managed Care - PPO | Admitting: Gastroenterology

## 2022-06-21 ENCOUNTER — Encounter: Payer: Self-pay | Admitting: Gastroenterology

## 2022-06-21 VITALS — BP 139/86 | HR 96 | Temp 98.7°F | Resp 17 | Ht 71.0 in | Wt 213.0 lb

## 2022-06-21 DIAGNOSIS — Z1211 Encounter for screening for malignant neoplasm of colon: Secondary | ICD-10-CM

## 2022-06-21 DIAGNOSIS — D120 Benign neoplasm of cecum: Secondary | ICD-10-CM

## 2022-06-21 DIAGNOSIS — K5730 Diverticulosis of large intestine without perforation or abscess without bleeding: Secondary | ICD-10-CM

## 2022-06-21 DIAGNOSIS — K640 First degree hemorrhoids: Secondary | ICD-10-CM

## 2022-06-21 DIAGNOSIS — D125 Benign neoplasm of sigmoid colon: Secondary | ICD-10-CM | POA: Diagnosis not present

## 2022-06-21 DIAGNOSIS — K635 Polyp of colon: Secondary | ICD-10-CM | POA: Diagnosis not present

## 2022-06-21 MED ORDER — SODIUM CHLORIDE 0.9 % IV SOLN: 500.0000 mL | INTRAVENOUS | Status: DC

## 2022-06-21 NOTE — Patient Instructions (Addendum)
- Resume previous diet. - Continue present medications. - Await pathology results. - Repeat colonoscopy for surveillance based on pathology results. - Return to GI office PRN. - Use fiber, for example Citrucel, Fibercon, Konsyl or Metamucil. - Internal hemorrhoids were noted on this study and may be amenable to hemorrhoid band ligation. If you are interested in further treatment of these  hemorrhoids with band ligation, please contact my clinic to set up an appointment for evaluation and treatment.  YOU HAD AN ENDOSCOPIC PROCEDURE TODAY AT THE [facility]:   Refer to the procedure report that was given to you for any specific questions about what was found during the examination.  If the procedure report does not answer your questions, please call your gastroenterologist to clarify.  If you requested that your care partner not be given the details of your procedure findings, then the procedure report has been included in a sealed envelope for you to review at your convenience later.  YOU SHOULD EXPECT: Some feelings of bloating in the abdomen. Passage of more gas than usual.  Walking can help get rid of the air that was put into your GI tract during the procedure and reduce the bloating. If you had a lower endoscopy (such as a colonoscopy or flexible sigmoidoscopy) you may notice spotting of blood in your stool or on the toilet paper. If you underwent a bowel prep for your procedure, you may not have a normal bowel movement for a few days.  Please Note:  You might notice some irritation and congestion in your nose or some drainage.  This is from the oxygen used during your procedure.  There is no need for concern and it should clear up in a day or so.  SYMPTOMS TO REPORT IMMEDIATELY:  Following lower endoscopy (colonoscopy or flexible sigmoidoscopy):  Excessive amounts of blood in the stool  Significant tenderness or worsening of abdominal pains  Swelling of the abdomen that is new,  acute  Fever of 100F or higher  For urgent or emergent issues, a gastroenterologist can be reached at any hour by calling [phone]. Do not use MyChart messaging for urgent concerns.    DIET:  We do recommend a small meal at first, but then you may proceed to your regular diet.  Drink plenty of fluids but you should avoid alcoholic beverages for 24 hours.  ACTIVITY:  You should plan to take it easy for the rest of today and you should NOT DRIVE or use heavy machinery until tomorrow (because of the sedation medicines used during the test).    FOLLOW UP: Our staff will call the number listed on your records the next business day following your procedure.  We will call around 7:15- 8:00 am to check on you and address any questions or concerns that you may have regarding the information given to you following your procedure. If we do not reach you, we will leave a message.     If any biopsies were taken you will be contacted by phone or by letter within the next 1-3 weeks.  Please call us at [phone] if you have not heard about the biopsies in 3 weeks.    SIGNATURES/CONFIDENTIALITY: You and/or your care partner have signed paperwork which will be entered into your electronic medical record.  These signatures attest to the fact that that the information above on your After Visit Summary has been reviewed and is understood.  Full responsibility of the confidentiality of this discharge information  lies with you and/or your care-partner.

## 2022-06-21 NOTE — Op Note (Signed)
[facility] Patient Name: Brad Holmes Procedure Date: 06/21/2022 8:01 AM MRN: [id number] Endoscopist: Doristine Locks , MD, [id number] Age: 52 Referring MD:  Date of Birth: 07/23/1970 Gender: Male Account #: [id number] Procedure:                Colonoscopy Indications:              Screening for colorectal malignant neoplasm, This                            is the patient's first colonoscopy Medicines:                Monitored Anesthesia Care Procedure:                Pre-Anesthesia Assessment:                           - Prior to the procedure, a History and Physical                            was performed, and patient medications and                            allergies were reviewed. The patient's tolerance of                            previous anesthesia was also reviewed. The risks                            and benefits of the procedure and the sedation                            options and risks were discussed with the patient.                            All questions were answered, and informed consent                            was obtained. Prior Anticoagulants: The patient has                            taken no anticoagulant or antiplatelet agents. ASA                            Grade Assessment: II - A patient with mild systemic                            disease. After reviewing the risks and benefits,                            the patient was deemed in satisfactory condition to                            undergo the procedure.  After obtaining informed consent, the colonoscope                            was passed under direct vision. Throughout the                            procedure, the patient's blood pressure, pulse, and                            oxygen saturations were monitored continuously. The                            Olympus SN [id number] was introduced through the anus                            and advanced to the the  terminal ileum. The                            colonoscopy was performed without difficulty. The                            patient tolerated the procedure well. The quality                            of the bowel preparation was excellent. The                            terminal ileum, ileocecal valve, appendiceal                            orifice, and rectum were photographed. Scope In: 8:06:07 AM Scope Out: 8:21:11 AM Scope Withdrawal Time: 0 hours 12 minutes 53 seconds  Total Procedure Duration: 0 hours 15 minutes 4 seconds  Findings:                 The perianal and digital rectal examinations were                            normal.                           A 5 mm polyp was found in the cecum. The polyp was                            sessile. The polyp was removed with a cold snare.                            Resection and retrieval were complete. Estimated                            blood loss was minimal.                           A 3 mm polyp was found in the sigmoid colon. The  polyp was sessile. The polyp was removed with a                            cold snare. Resection and retrieval were complete.                            Estimated blood loss was minimal.                           Multiple small-mouthed diverticula were found in                            the sigmoid colon and transverse colon.                           Non-bleeding internal hemorrhoids were found during                            retroflexion. The hemorrhoids were small.                           The terminal ileum appeared normal. Complications:            No immediate complications. Estimated Blood Loss:     Estimated blood loss was minimal. Impression:               - One 5 mm polyp in the cecum, removed with a cold                            snare. Resected and retrieved.                           - One 3 mm polyp in the sigmoid colon, removed with                             a cold snare. Resected and retrieved.                           - Diverticulosis in the sigmoid colon and in the                            transverse colon.                           - Non-bleeding internal hemorrhoids.                           - The examined portion of the ileum was normal.                           - The GI Genius (intelligent endoscopy module),                            computer-aided polyp detection system powered by AI  was utilized to detect colorectal polyps through                            enhanced visualization during colonoscopy. Recommendation:           - Patient has a contact number available for                            emergencies. The signs and symptoms of potential                            delayed complications were discussed with the                            patient. Return to normal activities tomorrow.                            Written discharge instructions were provided to the                            patient.                           - Resume previous diet.                           - Continue present medications.                           - Await pathology results.                           - Repeat colonoscopy for surveillance based on                            pathology results.                           - Return to GI office PRN.                           - Use fiber, for example Citrucel, Fibercon, Konsyl                            or Metamucil.                           - Internal hemorrhoids were noted on this study and                            may be amenable to hemorrhoid band ligation. If you                            are interested in further treatment of these                            hemorrhoids with  band ligation, please contact my                            clinic to set up an appointment for evaluation and                            treatment. Doristine Locks, MD 06/21/2022 8:27:33  AM

## 2022-06-21 NOTE — Progress Notes (Signed)
Report to PACU, RN, vss, BBS= Clear.  

## 2022-06-21 NOTE — Progress Notes (Signed)
Pt's states no medical or surgical changes since previsit or office visit. 

## 2022-06-21 NOTE — Progress Notes (Signed)
Called to room to assist during endoscopic procedure.  Patient ID and intended procedure confirmed with present staff. Received instructions for my participation in the procedure from the performing physician.  

## 2022-06-21 NOTE — Progress Notes (Signed)
GASTROENTEROLOGY PROCEDURE H&P NOTE   Primary Care Physician: Loyola Mast, MD    Reason for Procedure:  Colon Cancer screening  Plan:    Colonoscopy  Patient is appropriate for endoscopic procedure(s) in the ambulatory ([facility]) setting.  The nature of the procedure, as well as the risks, benefits, and alternatives were carefully and thoroughly reviewed with the patient. Ample time for discussion and questions allowed. The patient understood, was satisfied, and agreed to proceed.     HPI: Brad Holmes is a 52 y.o. male who presents for colonoscopy for routine Colon Cancer screening.  No active GI symptoms.  No known family history of colon cancer or related malignancy.  Patient is otherwise without complaints or active issues today.  Past Medical History:  Diagnosis Date   History of COVID-19    home positive covid test 10 weeks ago per pt on 01-09-2021 cold sympptoms fever congestion x 5 days all symptoms resolved   Hypercholesterolemia    Hypertension    Stroke 11/14/2018   antibody test was positive never tested positive for covid, no residual deficit from right cr stroke   Urethral stricture 01/09/2021   bulbar, hematuria with occ clots    Past Surgical History:  Procedure Laterality Date   CARDIAC CATHETERIZATION     05/12/09   CYSTOSCOPY WITH URETHRAL DILATATION N/A 01/13/2021   Procedure: CYSTOSCOPY WITH, BALLOON  URETHRAL DILATATION , BILATERAL RETROGRADES, BLADDER BIOPSIES;  Surgeon: Bjorn Pippin, MD;  Location: [facility];  Service: Urology;  Laterality: N/A;   EYE SURGERY Left 2017   macular traction surgery   IR 3D INDEPENDENT WKST  11/24/2018   IR ANGIO INTRA EXTRACRAN SEL COM CAROTID INNOMINATE BILAT MOD SED  11/14/2018   IR ANGIO INTRA EXTRACRAN SEL INTERNAL CAROTID UNI R MOD SED  11/24/2018   IR ANGIO VERTEBRAL SEL SUBCLAVIAN INNOMINATE UNI R MOD SED  11/24/2018   IR ANGIO VERTEBRAL SEL VERTEBRAL BILAT MOD SED  11/14/2018   IR NEURO EACH  ADD'L AFTER BASIC UNI RIGHT (MS)  11/24/2018   RADIOLOGY WITH ANESTHESIA N/A 11/24/2018   Procedure: RADIOLOGY WITH ANESTHESIA  STENTING;  Surgeon: Julieanne Cotton, MD;  Location: [facility];  Service: Radiology;  Laterality: N/A;   WISDOM TOOTH EXTRACTION     age 73    Prior to Admission medications   Medication Sig Start Date End Date Taking? Authorizing Provider  losartan (COZAAR) 25 MG tablet Take 1 tablet (25 mg total) by mouth daily. 12/08/21  Yes Monge, Petra Kuba, NP  rosuvastatin (CRESTOR) 40 MG tablet Take 1 tablet (40 mg total) by mouth daily. 12/08/21  Yes Monge, Petra Kuba, NP  Evolocumab (REPATHA SURECLICK) 140 MG/ML SOAJ ADMINISTER 1 ML UNDER THE SKIN EVERY 14 DAYS 04/30/22   Chrystie Nose, MD    Current Outpatient Medications  Medication Sig Dispense Refill   losartan (COZAAR) 25 MG tablet Take 1 tablet (25 mg total) by mouth daily. 90 tablet 3   rosuvastatin (CRESTOR) 40 MG tablet Take 1 tablet (40 mg total) by mouth daily. 90 tablet 3   Evolocumab (REPATHA SURECLICK) 140 MG/ML SOAJ ADMINISTER 1 ML UNDER THE SKIN EVERY 14 DAYS 6 mL 3   Current Facility-Administered Medications  Medication Dose Route Frequency Provider Last Rate Last Admin   0.9 %  sodium chloride infusion  500 mL Intravenous Continuous Kendarious Gudino V, DO        Allergies as of 06/21/2022 - Review Complete 06/21/2022  Allergen Reaction Noted  No known allergies      Family History  Problem Relation Age of Onset   Cancer Mother        Lung   Hyperlipidemia Mother    Heart failure Father    Heart disease Father 45   Heart disease Paternal Uncle    Colon cancer Neg Hx    Colon polyps Neg Hx    Esophageal cancer Neg Hx    Rectal cancer Neg Hx    Stomach cancer Neg Hx     Social History   Socioeconomic History   Marital status: Married    Spouse name: Not on file   Number of children: 3   Years of education: Not on file   Highest education level: Master's degree (e.g., MA, MS, MEng, MEd,  MSW, MBA)  Occupational History   Occupation: HVAC    Comment: [employer]  Tobacco Use   Smoking status: Never   Smokeless tobacco: Never  Vaping Use   Vaping Use: Never used  Substance and Sexual Activity   Alcohol use: Yes    Comment: rare   Drug use: No   Sexual activity: Not on file  Other Topics Concern   Not on file  Social History Narrative   Three biologic and two step children   Social Determinants of Health   Financial Resource Strain: Not on file  Food Insecurity: Not on file  Transportation Needs: Not on file  Physical Activity: Not on file  Stress: Not on file  Social Connections: Not on file  Intimate Partner Violence: Not on file    Physical Exam: Vital signs in last 24 hours:  135/81   Pulse 97   Temp 98.7 F (37.1 C)   Ht  (1.803 m)   Wt 213 lb (96.6 kg)   SpO2 97%   BMI 29.71 kg/m  GEN: NAD EYE: Sclerae anicteric ENT: MMM CV: Non-tachycardic Pulm: CTA b/l GI: Soft, NT/ND NEURO:  Alert & Oriented x 3   Doristine Locks, DO [facility]   06/21/2022 7:54 AM

## 2022-06-22 ENCOUNTER — Telehealth: Payer: Self-pay

## 2022-06-22 NOTE — Telephone Encounter (Signed)
  Follow up Call-     06/21/2022    7:20 AM  Call back number  Post procedure Call Back phone  # [phone]  Permission to leave phone message Yes     Patient questions:  Do you have a fever, pain , or abdominal swelling? No. Pain Score  0 *  Have you tolerated food without any problems? Yes.    Have you been able to return to your normal activities? Yes.    Do you have any questions about your discharge instructions: Diet   No. Medications  No. Follow up visit  No.  Do you have questions or concerns about your Care? No.  Actions: * If pain score is 4 or above: No action needed, pain <4.

## 2022-06-26 ENCOUNTER — Encounter: Payer: Self-pay | Admitting: Gastroenterology

## 2022-12-18 ENCOUNTER — Encounter: Payer: Self-pay | Admitting: Family Medicine

## 2022-12-18 ENCOUNTER — Ambulatory Visit: Payer: Commercial Managed Care - PPO | Admitting: Family Medicine

## 2022-12-18 VITALS — BP 142/90 | HR 80 | Temp 97.6°F | Ht 71.0 in | Wt 215.8 lb

## 2022-12-18 DIAGNOSIS — G479 Sleep disorder, unspecified: Secondary | ICD-10-CM | POA: Diagnosis not present

## 2022-12-18 DIAGNOSIS — I10 Essential (primary) hypertension: Secondary | ICD-10-CM

## 2022-12-18 DIAGNOSIS — Z8673 Personal history of transient ischemic attack (TIA), and cerebral infarction without residual deficits: Secondary | ICD-10-CM

## 2022-12-18 DIAGNOSIS — E6609 Other obesity due to excess calories: Secondary | ICD-10-CM

## 2022-12-18 DIAGNOSIS — E782 Mixed hyperlipidemia: Secondary | ICD-10-CM | POA: Diagnosis not present

## 2022-12-18 DIAGNOSIS — R5383 Other fatigue: Secondary | ICD-10-CM | POA: Diagnosis not present

## 2022-12-18 DIAGNOSIS — Z131 Encounter for screening for diabetes mellitus: Secondary | ICD-10-CM

## 2022-12-18 DIAGNOSIS — E66811 Obesity, class 1: Secondary | ICD-10-CM

## 2022-12-18 DIAGNOSIS — Z6830 Body mass index (BMI) 30.0-30.9, adult: Secondary | ICD-10-CM

## 2022-12-18 LAB — BASIC METABOLIC PANEL
BUN: 14 mg/dL (ref 6–23)
CO2: 27 meq/L (ref 19–32)
Calcium: 9.6 mg/dL (ref 8.4–10.5)
Chloride: 105 meq/L (ref 96–112)
Creatinine, Ser: 0.95 mg/dL (ref 0.40–1.50)
GFR: 90.79 mL/min (ref >60.00)
Glucose, Bld: 96 mg/dL (ref 70–99)
Potassium: 4.3 meq/L (ref 3.5–5.1)
Sodium: 139 meq/L (ref 135–145)

## 2022-12-18 LAB — HEMOGLOBIN A1C: Hgb A1c MFr Bld: 5.4 % (ref 4.6–6.5)

## 2022-12-18 LAB — TESTOSTERONE: Testosterone: 340.92 ng/dL (ref 300.00–890.00)

## 2022-12-18 MED ORDER — LOSARTAN POTASSIUM-HCTZ 50-12.5 MG PO TABS
1.0000 | ORAL_TABLET | Freq: Every day | ORAL | 5 refills | Status: DC
Start: 2022-12-18 — End: 2023-06-17

## 2022-12-18 NOTE — Assessment & Plan Note (Signed)
Managed by Dr. Rennis Golden. Continue rosuvastatin 40 mg daily and Repatha.

## 2022-12-18 NOTE — Assessment & Plan Note (Signed)
Mr. Brad Holmes' BP is elevated today. Review of his chart shows he has consistently had systolic BPs > 140 over the past year. I discussed my concerns for his risk for recurrent stroke/TIAs in light of his ongoing Stage 2 hypertension. I will check his renal function today. I will switch him from losartan to losartan-HCTZ 50-12.5 mg daily. I asked him to return in 1 month for reassessment.

## 2022-12-18 NOTE — Progress Notes (Signed)
[facility] [address] [place] [address] Dept: [phone] Dept Fax: [phone]  Chronic Care Office Visit  Subjective:    Patient ID: Brad Holmes, male    DOB: May 06, 1970, 52 y.o..   MRN: [id number]  Chief Complaint  Patient presents with   Follow-up    F/u.  C/o not sleeping well    History of Present Illness:  Patient is in today for reassessment of chronic medical issues.  Brad Holmes has a history of hypertension. He had a TIA some years ago, with full recovery. 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 He was previously on carvedilol and aspirin, but these were stopped. His blood pressure is currently managed with losartan 25 mg daily. He has not seen me in 16 months, since he initially established care.  Brad Holmes has hyperlipidemia. He is managed by Dr, Rennis Golden on rosuvastatin 40 mg daily and Repatha injections.   Brad Holmes notes he has been having some sleeping difficulties. He notes this involves waking up at 2-3:00 in the morning and not being able to get back to sleep. Sometimes, this relates to having headaches at the time. This doesn't occur every night, as he has slept wellt he last several nights.  Brad Holmes also notes concerns about weight gain. He states he had gained up to around 225 and has dropped back down some. He describes exercising by walkign about 4 miles in a hour 4 times a week. He also has tried to improve his diet.  Past Medical History: Patient Active Problem List   Diagnosis Date Noted   History of TIA (transient ischemic attack) 12/18/2022   Sleeping difficulty 12/18/2022   Urethral stricture 08/22/2021   Diverticulosis 12/23/2020   Aortic atherosclerosis (HCC) 12/23/2020   Adhesive capsulitis of right shoulder 02/05/2020   Abnormal transaminases 08/12/2018   Essential hypertension 08/11/2018   Hyperlipidemia 08/11/2018   Past  Surgical History:  Procedure Laterality Date   CARDIAC CATHETERIZATION     05/12/09   CYSTOSCOPY WITH URETHRAL DILATATION N/A 01/13/2021   Procedure: CYSTOSCOPY WITH, BALLOON  URETHRAL DILATATION , BILATERAL RETROGRADES, BLADDER BIOPSIES;  Surgeon: Bjorn Pippin, MD;  Location: [facility];  Service: Urology;  Laterality: N/A;   EYE SURGERY Left 2017   macular traction surgery   IR 3D INDEPENDENT WKST  11/24/2018   IR ANGIO INTRA EXTRACRAN SEL COM CAROTID INNOMINATE BILAT MOD SED  11/14/2018   IR ANGIO INTRA EXTRACRAN SEL INTERNAL CAROTID UNI R MOD SED  11/24/2018   IR ANGIO VERTEBRAL SEL SUBCLAVIAN INNOMINATE UNI R MOD SED  11/24/2018   IR ANGIO VERTEBRAL SEL VERTEBRAL BILAT MOD SED  11/14/2018   IR NEURO EACH ADD'L AFTER BASIC UNI RIGHT (MS)  11/24/2018   RADIOLOGY WITH ANESTHESIA N/A 11/24/2018   Procedure: RADIOLOGY WITH ANESTHESIA  STENTING;  Surgeon: Julieanne Cotton, MD;  Location: [facility];  Service: Radiology;  Laterality: N/A;   WISDOM TOOTH EXTRACTION     age 89   Family History  Problem Relation Age of Onset   Cancer Mother        Lung   Hyperlipidemia Mother    Heart failure Father    Heart disease Father 47   Heart disease Paternal Uncle    Colon cancer Neg Hx    Colon polyps Neg Hx    Esophageal cancer Neg Hx    Rectal cancer Neg Hx    Stomach  cancer Neg Hx    Outpatient Medications Prior to Visit  Medication Sig Dispense Refill   Evolocumab (REPATHA SURECLICK) 140 MG/ML SOAJ ADMINISTER 1 ML UNDER THE SKIN EVERY 14 DAYS 6 mL 3   rosuvastatin (CRESTOR) 40 MG tablet Take 1 tablet (40 mg total) by mouth daily. 90 tablet 3   losartan (COZAAR) 25 MG tablet Take 1 tablet (25 mg total) by mouth daily. 90 tablet 3   No facility-administered medications prior to visit.   Allergies  Allergen Reactions   No Known Allergies    Objective:   Today's Vitals   12/18/22 0752  BP: (!) 144/86  Pulse: 80  Temp: 97.6 F (36.4 C)  TempSrc: Temporal  SpO2: 98%   Weight: 215 lb 12.8 oz (97.9 kg)  Height: 5\' 11"  (1.803 m)   Body mass index is 30.1 kg/m.   General: Well developed, well nourished. No acute distress. Psych: Alert and oriented. Normal mood and affect.  Health Maintenance Due  Topic Date Due   Zoster Vaccines- Shingrix (1 of 2) Never done     Assessment & Plan:   Problem List Items Addressed This Visit       Cardiovascular and Mediastinum   Essential hypertension - Primary    Brad Holmes' BP is elevated today. Review of his chart shows he has consistently had systolic BPs > 140 over the past year. I discussed my concerns for his risk for recurrent stroke/TIAs in light of his ongoing Stage 2 hypertension. I will check his renal function today. I will switch him from losartan to losartan-HCTZ 50-12.5 mg daily. I asked him to return in 1 month for reassessment.      Relevant Medications   losartan-hydrochlorothiazide (HYZAAR) 50-12.5 MG tablet   Other Relevant Orders   Basic metabolic panel     Other   Class 1 obesity due to excess calories with body mass index (BMI) of 30.0 to 30.9 in adult    I will refer Brad Holmes to the [facility] to assist him with weight loss.      Relevant Orders   Amb Ref to Medical Weight Management   History of TIA (transient ischemic attack)    High risk for stroke. Recommend we continue to focus on BP and lipid control.      Hyperlipidemia    Managed by Dr. Rennis Golden. Continue rosuvastatin 40 mg daily and Repatha.       Relevant Medications   losartan-hydrochlorothiazide (HYZAAR) 50-12.5 MG tablet   Sleeping difficulty    Recommend he try taking Tylenol PM on nights he awakens with a headache.      Other Visit Diagnoses     Screening for diabetes mellitus (DM)       Relevant Orders   Hemoglobin A1c   Other fatigue       Suspect this is multifactorial. No history of ED. Patient requests testosterone testing. I will check this.   Relevant Orders   Testosterone        Return in about 4 weeks (around 01/15/2023) for Reassessment.   Loyola Mast, MD

## 2022-12-18 NOTE — Assessment & Plan Note (Signed)
High risk for stroke. Recommend we continue to focus on BP and lipid control.

## 2022-12-18 NOTE — Assessment & Plan Note (Signed)
Recommend he try taking Tylenol PM on nights he awakens with a headache.

## 2022-12-18 NOTE — Assessment & Plan Note (Signed)
I will refer Brad Holmes to the [facility] to assist him with weight loss.

## 2022-12-20 ENCOUNTER — Encounter (INDEPENDENT_AMBULATORY_CARE_PROVIDER_SITE_OTHER): Payer: Commercial Managed Care - PPO | Admitting: Physician Assistant

## 2022-12-25 ENCOUNTER — Other Ambulatory Visit: Payer: Self-pay | Admitting: Nurse Practitioner

## 2022-12-25 DIAGNOSIS — E785 Hyperlipidemia, unspecified: Secondary | ICD-10-CM

## 2023-01-18 ENCOUNTER — Ambulatory Visit: Payer: Commercial Managed Care - PPO | Admitting: Family Medicine

## 2023-01-18 ENCOUNTER — Telehealth: Payer: Self-pay | Admitting: Family Medicine

## 2023-01-18 NOTE — Telephone Encounter (Signed)
 Pt did not show up for appt. No letter sent. Pls advise

## 2023-01-22 NOTE — Telephone Encounter (Signed)
 2nd no show, final warning sent via mail and mychart

## 2023-05-07 NOTE — Progress Notes (Deleted)
 Cardiology Office Note:  .   Date:  05/07/2023  ID:  Brad Holmes, DOB 12-21-1970, MRN [id number] PCP: Loyola Mast, MD  [facility] Providers Cardiologist:  Chrystie Nose, MD { Click to update primary MD,subspecialty MD or APP then REFRESH:1}   Patient Profile: .      PMH Dyslipidemia Family history early CAD TIA/Stroke treated with TPA 2020  Referred to advanced lipid disorder clinic and seen by Dr. Rennis Golden July 2020.  He reported high cholesterol dating back to his teenage years.  Unfortunately he has a significant family history of heart disease on his father side including his father who died in his late 1s of a massive heart attack in his grandfather who had bypass surgery and numerous stents.  Interestingly, he found out about his cholesterol when he was [place].  He has been on atorvastatin in the past which caused significant side effects however he has tolerated rosuvastatin.  His wife is a Publishing rights manager.  Lab work at that time revealed total cholesterol 361, triglycerides 303, HDL 40, and LDL 260.  Of note, he was not fasting at time of the testing.  A cholesterol test 9 years prior revealed LDL 191.  He underwent heart catheterization by Dr. Excell Seltzer in 2011 which showed no significant coronary artery disease.  Lipid panel improved on rosuvastatin 40 mg with total cholesterol 212, triglycerides 171, HDL 33, and LDL 145.  Unfortunately, he suffered a TIA/stroke and ultimately had to undergo cerebral angiography and was found to have moderate stenosis of MCA.  He was given tPA.  There were 2 back-to-back episodes, but fortunately he has had full neurologic recovery.  He was started on PCSK9 inhibitor (Praluent) with overall improvement in LDL-C to 41, small LDL P137, LDL particle #597.  BP was labile and he was encouraged to follow-up with neurology who had initially told him to stop his antihypertensive but there was a question about  that.  At an office visit 01/08/2022 with Chriss Driver, NP, he was off his Praluent and Crestor.  He had noted some left shoulder pain but it was positional.  He was restarted on Crestor and Praluent. Insurance required change from Praluent to Repatha, he was unaware.  He was ultimately off Crestor for about 4 months.  There was a mild increase in ALT to 75.  No other explanation for this as he does not drink alcohol.   Last clinic visit was 02/09/2022 at which time LDL particle number 1162, LDL-C 113, small LDL-P 645, triglycerides 128, HDL 44. LDL not as low as it has been in the past, likely 2/2 time off statin therapy. He was advised to return in 4-6 weeks for repeat ALT, lipid panel and A1C.        History of Present Illness: .   Brad Holmes is a *** 53 y.o. male ***   Discussed the use of AI scribe software for clinical note transcription with the patient, who gave verbal consent to proceed.   ROS: ***       Studies Reviewed: .        *** Risk Assessment/Calculations:   {Does this patient have ATRIAL FIBRILLATION?[phone]} No BP recorded.  {Refresh Note OR Click here to enter BP  :1}***       Physical Exam:   VS:  There were no vitals taken for this visit.   Wt Readings from Last 3 Encounters:  12/18/22 215 lb  12.8 oz (97.9 kg)  06/21/22 213 lb (96.6 kg)  06/07/22 213 lb (96.6 kg)    GEN: Well nourished, well developed in no acute distress NECK: No JVD; No carotid bruits CARDIAC: ***RRR, no murmurs, rubs, gallops RESPIRATORY:  Clear to auscultation without rales, wheezing or rhonchi  ABDOMEN: Soft, non-tender, non-distended EXTREMITIES:  No edema; No deformity     ASSESSMENT AND PLAN: .    Familial hyperlipidemia/Dyslipidemia LDL goal < 55: TIA: Hypertension:    {Are you ordering a CV Procedure (e.g. stress test, cath, DCCV, TEE, etc)?   Press F2        :[id number]}  Disposition:***  Signed, Eligha Bridegroom, NP-C

## 2023-05-08 ENCOUNTER — Encounter (HOSPITAL_BASED_OUTPATIENT_CLINIC_OR_DEPARTMENT_OTHER): Payer: Commercial Managed Care - PPO | Admitting: Nurse Practitioner

## 2023-06-13 ENCOUNTER — Ambulatory Visit (INDEPENDENT_AMBULATORY_CARE_PROVIDER_SITE_OTHER): Payer: Self-pay | Admitting: Podiatry

## 2023-06-13 ENCOUNTER — Ambulatory Visit (INDEPENDENT_AMBULATORY_CARE_PROVIDER_SITE_OTHER)

## 2023-06-13 ENCOUNTER — Other Ambulatory Visit (HOSPITAL_COMMUNITY): Payer: Self-pay

## 2023-06-13 DIAGNOSIS — M216X2 Other acquired deformities of left foot: Secondary | ICD-10-CM

## 2023-06-13 DIAGNOSIS — B351 Tinea unguium: Secondary | ICD-10-CM

## 2023-06-13 DIAGNOSIS — M722 Plantar fascial fibromatosis: Secondary | ICD-10-CM | POA: Diagnosis not present

## 2023-06-13 MED ORDER — DICLOFENAC SODIUM 75 MG PO TBEC
75.0000 mg | DELAYED_RELEASE_TABLET | Freq: Two times a day (BID) | ORAL | 2 refills | Status: DC
  Filled 2023-06-13: qty 50, 25d supply, fill #0

## 2023-06-13 MED ORDER — TERBINAFINE HCL 250 MG PO TABS
250.0000 mg | ORAL_TABLET | Freq: Every day | ORAL | 0 refills | Status: DC
  Filled 2023-06-13: qty 28, fill #0
  Filled 2023-11-06 – 2023-12-02 (×2): qty 28, 28d supply, fill #0

## 2023-06-13 MED ORDER — TRIAMCINOLONE ACETONIDE 10 MG/ML IJ SUSP
10.0000 mg | Freq: Once | INTRAMUSCULAR | Status: AC
Start: 2023-06-13 — End: 2023-06-13
  Administered 2023-06-13: 10 mg via INTRA_ARTICULAR

## 2023-06-14 NOTE — Progress Notes (Signed)
 Subjective:   Patient ID: Brad Holmes, male   DOB: 53 y.o.   MRN: [id number]   HPI Patient presents stating he has started to develop a lot of pain in his left arch and forefoot and states he is having trouble walking on it and has history of nail disease of both feet that he is concerned about and what we can do with patient having had a history of Lamisil and laser   ROS      Objective:  Physical Exam  Neurovascular status intact with acute discomfort in the plantar left arch at the distal insertion of the fascia into the 1st and 2nd metatarsal with an area of inflammation fluid accumulation.  Also noted to have nail disease with thickness of the nailbeds 1-5 both feet with the right big toenail being worse but currently not painful     Assessment:  Inflammatory fasciitis cannot rule out bony pathology left plantar along with mycotic nail infection that he has improved with medications still present     Plan:  H&P reviewed both conditions separately.  For the plantar fascial inflammation x-ray reviewed I then did sterile prep I injected the distal fascial insertion 3 mg Dexasone Kenalog 5 mg Xylocaine advised on supportive therapy stretch and reduced activity for short period of time.  Discussed nails and at this point I do not see will be able to cure this but I like to keep it under control as he has less discomfort and I am just writing him for a pulse Lamisil treatment giving him instructions on taking 1 pill a day for 7 days once a month  X-rays indicate that there is no signs that there is a fracture or a arthritic condition associated with the inflammatory acute inflammation present

## 2023-06-17 ENCOUNTER — Encounter: Payer: Self-pay | Admitting: Family Medicine

## 2023-06-17 ENCOUNTER — Other Ambulatory Visit (HOSPITAL_COMMUNITY): Payer: Self-pay

## 2023-06-17 ENCOUNTER — Encounter (HOSPITAL_BASED_OUTPATIENT_CLINIC_OR_DEPARTMENT_OTHER): Payer: Self-pay | Admitting: Internal Medicine

## 2023-06-17 ENCOUNTER — Other Ambulatory Visit: Payer: Self-pay

## 2023-06-17 DIAGNOSIS — I10 Essential (primary) hypertension: Secondary | ICD-10-CM

## 2023-06-17 DIAGNOSIS — E785 Hyperlipidemia, unspecified: Secondary | ICD-10-CM

## 2023-06-17 MED ORDER — ROSUVASTATIN CALCIUM 40 MG PO TABS
40.0000 mg | ORAL_TABLET | Freq: Every day | ORAL | 3 refills | Status: AC
  Filled 2023-06-17 – 2023-07-01 (×3): qty 90, 90d supply, fill #0

## 2023-06-17 MED ORDER — LOSARTAN POTASSIUM-HCTZ 50-12.5 MG PO TABS
1.0000 | ORAL_TABLET | Freq: Every day | ORAL | 0 refills | Status: DC
  Filled 2023-06-17: qty 30, 30d supply, fill #0

## 2023-06-17 MED ORDER — LOSARTAN POTASSIUM-HCTZ 50-12.5 MG PO TABS
1.0000 | ORAL_TABLET | Freq: Every day | ORAL | 1 refills | Status: AC
  Filled 2023-06-17 – 2023-07-01 (×3): qty 90, 90d supply, fill #0

## 2023-06-18 ENCOUNTER — Other Ambulatory Visit: Payer: Self-pay

## 2023-06-19 ENCOUNTER — Other Ambulatory Visit (HOSPITAL_COMMUNITY): Payer: Self-pay

## 2023-06-19 ENCOUNTER — Telehealth: Payer: Self-pay | Admitting: Pharmacy Technician

## 2023-06-19 NOTE — Telephone Encounter (Signed)
 Pharmacy Patient Advocate Encounter   Received notification from Patient Advice Request messages that prior authorization for praluent is required/requested.   Insurance verification completed.   The patient is insured through [facility] .   Per test claim: PA required; PA submitted to above mentioned insurance via CoverMyMeds Key/confirmation #/EOC [facility] Status is pending

## 2023-06-19 NOTE — Addendum Note (Signed)
 Addended by: Francesco Inks on: 06/19/2023 08:45 AM   Modules accepted: Orders

## 2023-06-19 NOTE — Telephone Encounter (Signed)
 OK for repeat lipid profile and OK to switch to Praluent if insurance ok

## 2023-06-21 ENCOUNTER — Other Ambulatory Visit: Payer: Self-pay

## 2023-06-21 ENCOUNTER — Other Ambulatory Visit (HOSPITAL_COMMUNITY): Payer: Self-pay

## 2023-06-21 NOTE — Telephone Encounter (Signed)
 Pharmacy Patient Advocate Encounter  Received notification from [employer] that Prior Authorization for praluent  has been APPROVED from 06/21/23 to 06/19/24. Ran test claim, Copay is $100.00. This test claim was processed through [facility]- copay amounts may vary at other pharmacies due to pharmacy/plan contracts, or as the patient moves through the different stages of their insurance plan.   PA #/Case ID/Reference #: [id number]

## 2023-06-21 NOTE — Telephone Encounter (Signed)
 Left detailed message for pt regarding Praluent  prior auth approval. Advised pt that we will need to know what Pharmacy he prefers. Call back number left.

## 2023-06-26 ENCOUNTER — Other Ambulatory Visit: Payer: Self-pay

## 2023-06-26 ENCOUNTER — Encounter: Payer: Self-pay | Admitting: Pharmacist

## 2023-06-26 ENCOUNTER — Other Ambulatory Visit: Payer: Self-pay | Admitting: Internal Medicine

## 2023-06-26 ENCOUNTER — Other Ambulatory Visit (HOSPITAL_COMMUNITY): Payer: Self-pay

## 2023-06-26 DIAGNOSIS — Z8673 Personal history of transient ischemic attack (TIA), and cerebral infarction without residual deficits: Secondary | ICD-10-CM

## 2023-06-26 DIAGNOSIS — Z8249 Family history of ischemic heart disease and other diseases of the circulatory system: Secondary | ICD-10-CM

## 2023-06-26 DIAGNOSIS — E7849 Other hyperlipidemia: Secondary | ICD-10-CM

## 2023-06-26 DIAGNOSIS — E785 Hyperlipidemia, unspecified: Secondary | ICD-10-CM

## 2023-06-27 ENCOUNTER — Other Ambulatory Visit (HOSPITAL_COMMUNITY): Payer: Self-pay

## 2023-06-27 ENCOUNTER — Other Ambulatory Visit: Payer: Self-pay

## 2023-06-27 MED ORDER — PRALUENT 150 MG/ML ~~LOC~~ SOAJ
150.0000 mg | SUBCUTANEOUS | 5 refills | Status: DC
Start: 2023-06-27 — End: 2023-06-27
  Filled 2023-06-27: qty 2, 28d supply, fill #0

## 2023-06-27 MED ORDER — PRALUENT 150 MG/ML ~~LOC~~ SOAJ
150.0000 mg | SUBCUTANEOUS | 11 refills | Status: AC
  Filled 2023-06-27: qty 2, 28d supply, fill #0

## 2023-06-27 NOTE — Addendum Note (Signed)
 Addended by: Francesco Inks on: 06/27/2023 07:38 AM   Modules accepted: Orders

## 2023-06-29 ENCOUNTER — Other Ambulatory Visit (HOSPITAL_COMMUNITY): Payer: Self-pay

## 2023-07-01 ENCOUNTER — Telehealth: Admitting: Family Medicine

## 2023-07-01 ENCOUNTER — Other Ambulatory Visit: Payer: Self-pay

## 2023-07-01 ENCOUNTER — Telehealth

## 2023-07-01 ENCOUNTER — Other Ambulatory Visit (HOSPITAL_COMMUNITY): Payer: Self-pay

## 2023-07-01 DIAGNOSIS — J208 Acute bronchitis due to other specified organisms: Secondary | ICD-10-CM | POA: Diagnosis not present

## 2023-07-01 DIAGNOSIS — B9689 Other specified bacterial agents as the cause of diseases classified elsewhere: Secondary | ICD-10-CM

## 2023-07-01 MED ORDER — AZITHROMYCIN 250 MG PO TABS
ORAL_TABLET | ORAL | 0 refills | Status: AC
  Filled 2023-07-01: qty 6, 5d supply, fill #0

## 2023-07-01 MED ORDER — BENZONATATE 100 MG PO CAPS
100.0000 mg | ORAL_CAPSULE | Freq: Three times a day (TID) | ORAL | 0 refills | Status: DC | PRN
  Filled 2023-07-01: qty 30, 10d supply, fill #0

## 2023-07-01 NOTE — Progress Notes (Deleted)
 Cardiology Office Note:  .   Date:  07/01/2023  ID:  Brad Holmes, DOB 01-May-1970, MRN [id number] PCP: Graig Lawyer, MD  [facility] Providers Cardiologist:  Hazle Lites, MD { Click to update primary MD,subspecialty MD or APP then REFRESH:1}   Patient Profile: .      PMH Dyslipidemia Family history early CAD TIA/Stroke treated with tPA 2020  Referred to advanced lipid disorder clinic and seen by Dr. Maximo Spar July 2020.  He reported high cholesterol dating back to his teenage years.  Unfortunately, he has a significant family history of heart disease on his father side including his father who died in his late 71s of a massive heart attack and he has a grandfather who had bypass surgery and numerous stents.  Interestingly, he found out about his elevated cholesterol while in [place] for [place].  Had been on atorvastatin  in the past but it caused significant side effects.  He has tolerated rosuvastatin .  His wife is a Publishing rights manager.  Lab work at that time revealed total cholesterol 361, triglycerides 303, HDL 40, and LDL 260.  Of note, he was not fasting at the time of testing.  A cholesterol test 9 years prior revealed LDL 191.  He underwent heart catheterization by Dr. Arlester Ladd in 2011 which showed no significant coronary artery disease.  Lipid panel improved on rosuvastatin  40 mg with total cholesterol 212, triglycerides 171, HDL 33, and LDL 145.  Unfortunately, he suffered a TIA/stroke and had to undergo cerebral angiography found to have moderate stenosis of MCA.  He was given tPA.  There were 2 back-to-back episodes but fortunately he had full neurologic recovery.  He was started on Praluent  with overall improvement in LDL-C to 41, small LDL P137, and LDL particle number 597.  BP was labile and he was encouraged to follow-up with neurology who had initially told him to stop his antihypertensive but there was a question about that.  At office visit with Marlana Silvan, NP on 01/08/2022 he was off Praluent  and Crestor .  He had noted some left shoulder pain but it was positional.  He restarted Crestor  and Praluent .  Insurance required change from Praluent  to Repatha , and he was unaware.  He was ultimately off Crestor  for about 4 months.  There was a mild increase in ALT to 75 with no other explanation as he does not drink alcohol.  Last clinic visit was 02/09/2022 at which time LDL particle number was 1162, LDL-C 113, small LDL-P 645, HDL 44 and triglycerides 128.  LDL not as low as it had been in the past likely secondary to time off statin therapy. He was advised to return in 4 to 6 weeks for repeat lipid panel, ALT, and A1c.       History of Present Illness: .   Brad Holmes is a *** 53 y.o. male ***   Discussed the use of AI scribe software for clinical note transcription with the patient, who gave verbal consent to proceed.   ROS: ***       Studies Reviewed: Aaron Aas         Lipoprotein (a)  Date/Time Value Ref Range Status  02/13/2019 08:55 AM 50.2 <75.0 nmol/L Final    Comment:    Note:  Values greater than or equal to 75.0 nmol/L may        indicate an independent risk factor for CHD,        but must be  evaluated with caution when applied        to non-Caucasian populations due to the        influence of genetic factors on Lp(a) across        ethnicities.      *** Risk Assessment/Calculations:   {Does this patient have ATRIAL FIBRILLATION?:[phone]} No BP recorded.  {Refresh Note OR Click here to enter BP  :1}***       Physical Exam:   VS:  There were no vitals taken for this visit.   Wt Readings from Last 3 Encounters:  12/18/22 215 lb 12.8 oz (97.9 kg)  06/21/22 213 lb (96.6 kg)  06/07/22 213 lb (96.6 kg)    GEN: Well nourished, well developed in no acute distress NECK: No JVD; No carotid bruits CARDIAC: ***RRR, no murmurs, rubs, gallops RESPIRATORY:  Clear to auscultation without rales, wheezing or rhonchi  ABDOMEN: Soft,  non-tender, non-distended EXTREMITIES:  No edema; No deformity     ASSESSMENT AND PLAN: .   ***    {Are you ordering a CV Procedure (e.g. stress test, cath, DCCV, TEE, etc)?   Press F2        :[id number]}  Disposition:***  Signed, Slater Duncan, NP-C

## 2023-07-01 NOTE — Progress Notes (Signed)
 E-Visit for Cough   We are sorry that you are not feeling well.  Here is how we plan to help!  Based on your presentation I believe you most likely have A cough due to bacteria.  When patients have a fever and a productive cough with a change in color or increased sputum production, we are concerned about bacterial bronchitis.  If left untreated it can progress to pneumonia.  If your symptoms do not improve with your treatment plan it is important that you contact your provider.   I have prescribed Azithromyin 250 mg: two tablets now and then one tablet daily for 4 additonal days    In addition you may use A prescription cough medication called Tessalon  Perles 100mg . You may take 1-2 capsules every 8 hours as needed for your cough.   From your responses in the eVisit questionnaire you describe inflammation in the upper respiratory tract which is causing a significant cough.  This is commonly called Bronchitis and has four common causes:   Allergies Viral Infections Acid Reflux Bacterial Infection Allergies, viruses and acid reflux are treated by controlling symptoms or eliminating the cause. An example might be a cough caused by taking certain blood pressure medications. You stop the cough by changing the medication. Another example might be a cough caused by acid reflux. Controlling the reflux helps control the cough.  USE OF BRONCHODILATOR ("RESCUE") INHALERS: There is a risk from using your bronchodilator too frequently.  The risk is that over-reliance on a medication which only relaxes the muscles surrounding the breathing tubes can reduce the effectiveness of medications prescribed to reduce swelling and congestion of the tubes themselves.  Although you feel brief relief from the bronchodilator inhaler, your asthma may actually be worsening with the tubes becoming more swollen and filled with mucus.  This can delay other crucial treatments, such as oral steroid medications. If you need to use  a bronchodilator inhaler daily, several times per day, you should discuss this with your provider.  There are probably better treatments that could be used to keep your asthma under control.     HOME CARE Only take medications as instructed by your medical team. Complete the entire course of an antibiotic. Drink plenty of fluids and get plenty of rest. Avoid close contacts especially the very young and the elderly Cover your mouth if you cough or cough into your sleeve. Always remember to wash your hands A steam or ultrasonic humidifier can help congestion.   GET HELP RIGHT AWAY IF: You develop worsening fever. You become short of breath You cough up blood. Your symptoms persist after you have completed your treatment plan MAKE SURE YOU  Understand these instructions. Will watch your condition. Will get help right away if you are not doing well or get worse.    Thank you for choosing an e-visit.  Your e-visit answers were reviewed by a board certified advanced clinical practitioner to complete your personal care plan. Depending upon the condition, your plan could have included both over the counter or prescription medications.  Please review your pharmacy choice. Make sure the pharmacy is open so you can pick up prescription now. If there is a problem, you may contact your provider through [employer] and have the prescription routed to another pharmacy.  Your safety is important to us . If you have drug allergies check your prescription carefully.   For the next 24 hours you can use MyChart to ask questions about today's visit, request a  non-urgent call back, or ask for a work or school excuse. You will get an email in the next two days asking about your experience. I hope that your e-visit has been valuable and will speed your recovery.  I provided 5 minutes of non face-to-face time during this encounter for chart review, medication and order placement, as well as and  documentation.

## 2023-07-03 ENCOUNTER — Encounter (HOSPITAL_BASED_OUTPATIENT_CLINIC_OR_DEPARTMENT_OTHER): Admitting: Nurse Practitioner

## 2023-08-28 ENCOUNTER — Encounter (HOSPITAL_BASED_OUTPATIENT_CLINIC_OR_DEPARTMENT_OTHER): Admitting: Nurse Practitioner

## 2023-08-28 NOTE — Progress Notes (Deleted)
 Cardiology Office Note   Date:  08/28/2023  ID:  Brad Holmes, DOB 1970-12-12, MRN [id number] PCP: Brad Garnette HERO, MD  [facility] Providers Cardiologist:  Vinie JAYSON Maxcy, MD { Click to update primary MD,subspecialty MD or APP then REFRESH:1}    PMH Dyslipidemia Family history early CAD Father died age 34s s/p MI Grandfather - CABG, multiple stents Cardiac catheterization in 2011 with normal coronary arteries Hypertension TIA/Stroke treated with tPA in 2020  Referred to advanced lipid disorder clinic and seen by Dr. Maxcy July 2020.  History of high cholesterol dating back to his teenage years.  Unfortunately he has significant family history on his father side with father who died in his 33s of a massive heart attack and grandfather who had bypass surgery and numerous stents.  Interestingly, he found out about elevated cholesterol while in training [place] for [place] and cycling.  He had been on atorvastatin  in the past but it caused significant side effects.  He tolerated rosuvastatin .  His wife is a Publishing rights manager.  Lab work at time of referral revealed total cholesterol 361, triglycerides 303, HDL 40, and LDL 260.  Of note, he was not fasting at the time of testing.  A cholesterol test 9 years prior revealed LDL 191.  He underwent heart catheterization by Dr. Wonda in 2011 which showed no significant coronary artery disease.  Unfortunately, he suffered a TIA/stroke and was found to have moderate stenosis of MCA treated with tPA.  There were 2 back-to-back episodes but fortunately he had full neurologic recovery.  He was started on Praluent  with overall improvement in LDL-C to 41, small LDL P137, and LDL particle #597.  BP was labile and he was encouraged to follow-up with neurology who had initially told him to stop antihypertensive agent but there was question about that.  At office visit with Damien Braver, NP on 01/08/2022 he was off Praluent  and Crestor .  He had noted some  left shoulder pain but it was positional.  He restarted the medications.  Insurance required change from Praluent  to Repatha  and he was unaware.  He was ultimately off Crestor  for about 4 months.  There was a mild increase in ALT to 75 with no other explanation as he does not drink alcohol.  Last clinic visit 02/09/2022 with Dr. Maxcy at which time LDL particle number was 1162, LDL-C 113, small LDL P645, HDL 44, and triglycerides 871.  LDL not as low in the past likely secondary to time off statin.  He was advised to return in 4 to 6 weeks for repeat lipid panel, ALT, and A1c.   History of Present Illness Brad Holmes is a 53 y.o. male ***  ROS: ***  Studies Reviewed      ***  Lipoprotein (a)  Date/Time Value Ref Range Status  02/13/2019 08:55 AM 50.2 <75.0 nmol/L Final    Comment:    Note:  Values greater than or equal to 75.0 nmol/L may        indicate an independent risk factor for CHD,        but must be evaluated with caution when applied        to non-Caucasian populations due to the        influence of genetic factors on Lp(a) across        ethnicities.     Risk Assessment/Calculations {Does this patient have ATRIAL FIBRILLATION?[phone]} No BP recorded.  {Refresh Note OR Click here to enter BP  :  1}***       Physical Exam VS:  There were no vitals taken for this visit.   Wt Readings from Last 3 Encounters:  12/18/22 215 lb 12.8 oz (97.9 kg)  06/21/22 213 lb (96.6 kg)  06/07/22 213 lb (96.6 kg)    GEN: Well nourished, well developed in no acute distress NECK: No JVD; No carotid bruits CARDIAC: ***RRR, no murmurs, rubs, gallops RESPIRATORY:  Clear to auscultation without rales, wheezing or rhonchi  ABDOMEN: Soft, non-tender, non-distended EXTREMITIES:  No edema; No deformity   ASSESSMENT AND PLAN ***    {Are you ordering a CV Procedure (e.g. stress test, cath, DCCV, TEE, etc)?   Press F2        :[id number]}  Dispo: ***  Signed, Rosaline Bane, NP-C

## 2023-08-30 ENCOUNTER — Encounter (HOSPITAL_COMMUNITY): Payer: Self-pay | Admitting: Interventional Radiology

## 2023-11-06 ENCOUNTER — Other Ambulatory Visit: Payer: Self-pay

## 2023-11-06 ENCOUNTER — Other Ambulatory Visit (HOSPITAL_COMMUNITY): Payer: Self-pay

## 2023-11-07 ENCOUNTER — Other Ambulatory Visit: Payer: Self-pay

## 2023-11-07 ENCOUNTER — Encounter: Payer: Self-pay | Admitting: Pharmacist

## 2023-11-12 ENCOUNTER — Other Ambulatory Visit: Payer: Self-pay

## 2023-12-02 ENCOUNTER — Other Ambulatory Visit (HOSPITAL_COMMUNITY): Payer: Self-pay

## 2024-01-02 DIAGNOSIS — H5213 Myopia, bilateral: Secondary | ICD-10-CM | POA: Diagnosis not present

## 2024-02-11 ENCOUNTER — Encounter: Payer: Self-pay | Admitting: Internal Medicine

## 2024-02-20 ENCOUNTER — Other Ambulatory Visit (HOSPITAL_COMMUNITY): Payer: Self-pay

## 2024-02-20 ENCOUNTER — Telehealth: Admitting: Physician Assistant

## 2024-02-20 ENCOUNTER — Other Ambulatory Visit: Payer: Self-pay

## 2024-02-20 DIAGNOSIS — J209 Acute bronchitis, unspecified: Secondary | ICD-10-CM | POA: Diagnosis not present

## 2024-02-20 MED ORDER — BENZONATATE 100 MG PO CAPS
100.0000 mg | ORAL_CAPSULE | Freq: Three times a day (TID) | ORAL | 0 refills | Status: AC | PRN
  Filled 2024-02-20 (×2): qty 21, 7d supply, fill #0

## 2024-02-20 NOTE — Progress Notes (Signed)
 We are sorry that you are not feeling well.  Here is how we plan to help!  Based on your presentation I believe you most likely have A cough due to a virus.  This is called viral bronchitis and is best treated by rest, plenty of fluids and control of the cough.  You may use Ibuprofen or Tylenol  as directed to help your symptoms.    Antibiotics are ineffective for viral bronchitis.  Supportive treatment is recommended at this time.   In addition you may use A prescription cough medication called Tessalon  Perles 100mg . You may take 1-2 capsules every 8 hours as needed for your cough.  I also recommend using over the counter Mucinex, max strength (blue and white box), this will help loosen the congestion.  From your responses in the eVisit questionnaire you describe inflammation in the upper respiratory tract which is causing a significant cough.  This is commonly called Bronchitis and has four common causes:   Allergies Viral Infections Acid Reflux Bacterial Infection  Allergies, viruses and acid reflux are treated by controlling symptoms or eliminating the cause. An example might be a cough caused by taking certain blood pressure medications. You stop the cough by changing the medication. Another example might be a cough caused by acid reflux. Controlling the reflux helps control the cough.  USE OF BRONCHODILATOR (RESCUE) INHALERS: There is a risk from using your bronchodilator too frequently.  The risk is that over-reliance on a medication which only relaxes the muscles surrounding the breathing tubes can reduce the effectiveness of medications prescribed to reduce swelling and congestion of the tubes themselves.   Although you feel brief relief from the bronchodilator inhaler, your asthma may actually be worsening with the tubes becoming more swollen and filled with mucus.  This can delay other crucial treatments, such as oral steroid medications. If you need to use a bronchodilator inhaler  daily, several times per day, you should discuss this with your provider.  There are probably better treatments that could be used to keep your asthma under control.     HOME CARE Only take medications as instructed by your medical team. Complete the entire course of an antibiotic. Drink plenty of fluids and get plenty of rest. Avoid close contacts especially the very young and the elderly Cover your mouth if you cough or cough into your sleeve. Always remember to wash your hands A steam or ultrasonic humidifier can help congestion.   GET HELP RIGHT AWAY IF: You develop worsening fever. You become short of breath You cough up blood. Your symptoms persist after you have completed your treatment plan MAKE SURE YOU  Understand these instructions. Will watch your condition. Will get help right away if you are not doing well or get worse.  Your e-visit answers were reviewed by a board certified advanced clinical practitioner to complete your personal care plan.  Depending on the condition, your plan could have included both over the counter or prescription medications. If there is a problem please reply  once you have received a response from your provider. Your safety is important to us .  If you have drug allergies check your prescription carefully.    You can use MyChart to ask questions about todays visit, request a non-urgent call back, or ask for a work or school excuse for 24 hours related to this e-Visit. If it has been greater than 24 hours you will need to follow up with your provider, or enter a new e-Visit to  address those concerns. You will get an e-mail in the next two days asking about your experience.  I hope that your e-visit has been valuable and will speed your recovery. Thank you for using e-visits.   I have spent 5 minutes in review of e-visit questionnaire, review and updating patient chart, medical decision making and response to patient.   Claudene Gatliff,  PA-C

## 2024-02-21 ENCOUNTER — Other Ambulatory Visit (HOSPITAL_BASED_OUTPATIENT_CLINIC_OR_DEPARTMENT_OTHER): Payer: Self-pay

## 2024-03-25 ENCOUNTER — Ambulatory Visit: Admitting: Radiology

## 2024-03-25 ENCOUNTER — Encounter: Payer: Self-pay | Admitting: Emergency Medicine

## 2024-03-25 ENCOUNTER — Ambulatory Visit
Admission: EM | Admit: 2024-03-25 | Discharge: 2024-03-25 | Disposition: A | Discharge status: Home / Self Care | Attending: Student | Admitting: Student

## 2024-03-25 ENCOUNTER — Telehealth: Admitting: Emergency Medicine

## 2024-03-25 DIAGNOSIS — R0902 Hypoxemia: Secondary | ICD-10-CM

## 2024-03-25 DIAGNOSIS — R0602 Shortness of breath: Secondary | ICD-10-CM

## 2024-03-25 DIAGNOSIS — R051 Acute cough: Secondary | ICD-10-CM | POA: Diagnosis not present

## 2024-03-25 MED ORDER — IPRATROPIUM-ALBUTEROL 0.5-2.5 (3) MG/3ML IN SOLN
3.0000 mL | Freq: Once | RESPIRATORY_TRACT | Status: AC
  Administered 2024-03-25: 3 mL via RESPIRATORY_TRACT

## 2024-03-25 NOTE — ED Provider Notes (Signed)
 " [facility]    CSN: [id number] Arrival date & time: 03/25/24  1749      History   Chief Complaint Chief Complaint  Patient presents with   Shortness of Breath    HPI Brad Holmes is a 54 y.o. male.   Pt initally sick 9 days ago. Pt presents due to SOB, congestion and cough. Cough is productive of bloody greenish brown.  Today is first day he has not had fever. Symptoms got dramatically worse today including SOB.  Exposure to influenza A.   Accompanied by wife, who is an NP.   HPI  Past Medical History:  Diagnosis Date   History of COVID-19    home positive covid test 10 weeks ago per pt on 01-09-2021 cold sympptoms fever congestion x 5 days all symptoms resolved   Hypercholesterolemia    Hypertension    Stroke (HCC) 11/14/2018   antibody test was positive never tested positive for covid, no residual deficit from right cr stroke   Urethral stricture 01/09/2021   bulbar, hematuria with occ clots    Patient Active Problem List   Diagnosis Date Noted   History of TIA (transient ischemic attack) 12/18/2022   Sleeping difficulty 12/18/2022   Class 1 obesity due to excess calories with body mass index (BMI) of 30.0 to 30.9 in adult 12/18/2022   Urethral stricture 08/22/2021   Diverticulosis 12/23/2020   Aortic atherosclerosis 12/23/2020   Adhesive capsulitis of right shoulder 02/05/2020   Abnormal transaminases 08/12/2018   Essential hypertension 08/11/2018   Hyperlipidemia 08/11/2018    Past Surgical History:  Procedure Laterality Date   CARDIAC CATHETERIZATION     05/12/09   CYSTOSCOPY WITH URETHRAL DILATATION N/A 01/13/2021   Procedure: CYSTOSCOPY WITH, BALLOON  URETHRAL DILATATION , BILATERAL RETROGRADES, BLADDER BIOPSIES;  Surgeon: Watt Norleen, MD;  Location: [facility];  Service: Urology;  Laterality: N/A;   EYE SURGERY Left 2017   macular traction surgery   IR 3D INDEPENDENT WKST  11/24/2018   IR ANGIO INTRA EXTRACRAN SEL COM  CAROTID INNOMINATE BILAT MOD SED  11/14/2018   IR ANGIO INTRA EXTRACRAN SEL INTERNAL CAROTID UNI R MOD SED  11/24/2018   IR ANGIO VERTEBRAL SEL SUBCLAVIAN INNOMINATE UNI R MOD SED  11/24/2018   IR ANGIO VERTEBRAL SEL VERTEBRAL BILAT MOD SED  11/14/2018   IR NEURO EACH ADD'L AFTER BASIC UNI RIGHT (MS)  11/24/2018   RADIOLOGY WITH ANESTHESIA N/A 11/24/2018   Procedure: RADIOLOGY WITH ANESTHESIA  STENTING;  Surgeon: Dolphus Carrion, MD;  Location: [facility];  Service: Radiology;  Laterality: N/A;   WISDOM TOOTH EXTRACTION     age 35       Home Medications    Prior to Admission medications  Medication Sig Start Date End Date Taking? Authorizing Provider  diclofenac  (VOLTAREN ) 75 MG EC tablet Take 1 tablet (75 mg total) by mouth 2 (two) times daily. 06/13/23   Magdalen Pasco RAMAN, DPM  losartan -hydrochlorothiazide  (HYZAAR ) 50-12.5 MG tablet Take 1 tablet by mouth daily. 06/17/23   Hilty, Vinie BROCKS, MD  PRALUENT  150 MG/ML SOAJ Inject 1 mL (150 mg total) into the skin every 14 (fourteen) days as directed 06/27/23   Mona Vinie BROCKS, MD  rosuvastatin  (CRESTOR ) 40 MG tablet Take 1 tablet (40 mg total) by mouth daily. 06/17/23   Hilty, Vinie BROCKS, MD  terbinafine  (LAMISIL ) 250 MG tablet Take 1 tablet (250 mg total) by mouth daily for 7 days and repeat as directed for 3 cycles  of  7 day treatments 06/13/23   Magdalen Pasco RAMAN, DPM    Family History Family History  Problem Relation Age of Onset   Cancer Mother        Lung   Hyperlipidemia Mother    Heart failure Father    Heart disease Father 75   Heart disease Paternal Uncle    Colon cancer Neg Hx    Colon polyps Neg Hx    Esophageal cancer Neg Hx    Rectal cancer Neg Hx    Stomach cancer Neg Hx     Social History Social History[1]   Allergies   No known allergies   Review of Systems Review of Systems  Constitutional:  Negative for appetite change, chills and fever.  HENT:  Positive for congestion. Negative for ear pain, rhinorrhea, sinus  pressure, sinus pain and sore throat.   Eyes:  Negative for redness and visual disturbance.  Respiratory:  Positive for cough and shortness of breath. Negative for chest tightness and wheezing.   Cardiovascular:  Negative for chest pain and palpitations.  Gastrointestinal:  Negative for abdominal pain, constipation, diarrhea, nausea and vomiting.  Genitourinary:  Negative for dysuria, frequency and urgency.  Musculoskeletal:  Negative for myalgias.  Neurological:  Negative for dizziness, weakness and headaches.  Psychiatric/Behavioral:  Negative for confusion.   All other systems reviewed and are negative.    Physical Exam Triage Vital Signs ED Triage Vitals  Encounter Vitals Group     BP 03/25/24 1757 118/73     Girls Systolic BP Percentile --      Girls Diastolic BP Percentile --      Boys Systolic BP Percentile --      Boys Diastolic BP Percentile --      Pulse Rate 03/25/24 1755 98     Resp 03/25/24 1757 20     Temp 03/25/24 1757 98.1 F (36.7 C)     Temp Source 03/25/24 1755 Oral     SpO2 03/25/24 1755 (!) 87 %     Weight --      Height --      Head Circumference --      Peak Flow --      Pain Score --      Pain Loc --      Pain Education --      Exclude from Growth Chart --    No data found.  Updated Vital Signs BP (!) 80/54 (BP Location: Right Arm)   Pulse 87   Temp 98.1 F (36.7 C) (Oral)   Resp 20   SpO2 (!) 89%   Visual Acuity Right Eye Distance:   Left Eye Distance:   Bilateral Distance:    Right Eye Near:   Left Eye Near:    Bilateral Near:     Physical Exam Vitals reviewed.  Constitutional:      General: He is in acute distress.     Appearance: Normal appearance. He is ill-appearing and diaphoretic.  HENT:     Head: Normocephalic and atraumatic.     Right Ear: Tympanic membrane, ear canal and external ear normal. No tenderness. No middle ear effusion. There is no impacted cerumen. Tympanic membrane is not perforated, erythematous, retracted  or bulging.     Left Ear: Tympanic membrane, ear canal and external ear normal. No tenderness.  No middle ear effusion. There is no impacted cerumen. Tympanic membrane is not perforated, erythematous, retracted or bulging.     Nose: Nose normal. No congestion.  Mouth/Throat:     Mouth: Mucous membranes are moist.     Pharynx: Uvula midline. No oropharyngeal exudate or posterior oropharyngeal erythema.     Tonsils: No tonsillar exudate.  Eyes:     Extraocular Movements: Extraocular movements intact.     Pupils: Pupils are equal, round, and reactive to light.  Cardiovascular:     Rate and Rhythm: Normal rate and regular rhythm.     Heart sounds: Normal heart sounds.  Pulmonary:     Effort: Pulmonary effort is normal.     Breath sounds: Decreased breath sounds, wheezing and rhonchi present. No rales.     Comments: Lungs initially with decreased breath sounds, wheezes, and rhonchi.  Following DuoNeb treatment, improvement in breath sounds. Abdominal:     Palpations: Abdomen is soft.     Tenderness: There is no abdominal tenderness. There is no guarding or rebound.  Lymphadenopathy:     Cervical: No cervical adenopathy.     Right cervical: No superficial, deep or posterior cervical adenopathy.    Left cervical: No superficial, deep or posterior cervical adenopathy.  Skin:    Comments: No rash   Neurological:     General: No focal deficit present.     Mental Status: He is alert and oriented to person, place, and time.     Comments: PERRLA, EOMI.  CN II through XII grossly intact.  Psychiatric:        Mood and Affect: Mood normal.        Behavior: Behavior normal.        Thought Content: Thought content normal.        Judgment: Judgment normal.      UC Treatments / Results  Labs (all labs ordered are listed, but only abnormal results are displayed) Labs Reviewed - No data to display  EKG   Radiology No results found.  Procedures Procedures (including critical care  time)  Medications Ordered in UC Medications  ipratropium-albuterol  (DUONEB) 0.5-2.5 (3) MG/3ML nebulizer solution 3 mL (3 mLs Nebulization Given 03/25/24 1810)    Initial Impression / Assessment and Plan / UC Course  I have reviewed the triage vital signs and the nursing notes.  Pertinent labs & imaging results that were available during my care of the patient were reviewed by me and considered in my medical decision making (see chart for details).     Patient is a pleasant 54 y.o. male presenting with shortness of breath, hypoxia, and near syncope. The patient is afebrile and nontachycardic.  Antipyretic has not been administered today.  He was initially oxygenating at 86% on room air, he was placed on 3 L of oxygen by nasal cannula and his O2 rose to 90%, and then he was placed on DuoNeb nebulizer +3 L of oxygen, and his O2 raised to 95%.  Blood pressure was initially 118/73, but following the near-syncope, it dropped to 80/54.  Lungs initially with decreased breath sounds, wheezes, and rhonchi; following DuoNeb treatment, improvement in breath sounds.  Reassuring neuro exam.  As we administered the DuoNeb nebulizer treatment, the patient had a near syncopal episode.  He did not fully lose consciousness.  As we prepared the EKG machine, EMS arrived.  The patient was transported to the emergency department without issue.  Final Clinical Impressions(s) / UC Diagnoses   Final diagnoses:  Acute cough  Hypoxia  Shortness of breath   Discharge Instructions   None    ED Prescriptions   None    PDMP not reviewed  this encounter.     [1]  Social History Tobacco Use   Smoking status: Never   Smokeless tobacco: Never  Vaping Use   Vaping status: Never Used  Substance Use Topics   Alcohol use: Yes    Comment: rare   Drug use: No     Arlyss Leita BRAVO, PA-C 03/25/24 1828  "

## 2024-03-25 NOTE — ED Triage Notes (Signed)
 Pt initally sick 9 days ago. Pt presents due to SOB, congestion and cough. Today is first day he has not had fever.   He had travel before symptoms began and other had the flu

## 2024-03-25 NOTE — Progress Notes (Signed)
" °  I think your wife is RIGHT!!!  Because of how severe your symptoms are with the shortness of breath and have brown or bloody mucus, I feel your condition warrants further evaluation and I recommend that you be seen in a face-to-face visit.   NOTE: There will be NO CHARGE for this E-Visit   If you are having a true medical emergency, please call 911.     For an urgent face to face visit, [facility] has multiple urgent care centers for your convenience.  Click the link below for the full list of locations and hours, walk-in wait times, appointment scheduling options and driving directions:  Urgent Care - [place], [place], [place], [place], [place], [place]  [place]     Your MyChart E-visit questionnaire answers were reviewed by a board certified advanced clinical practitioner to complete your personal care plan based on your specific symptoms.    Thank you for using e-Visits.    "

## 2024-03-25 NOTE — ED Notes (Signed)
 EMS called and dispatched at this time.

## 2024-04-09 ENCOUNTER — Encounter: Payer: Self-pay | Admitting: Family Medicine

## 2024-04-09 ENCOUNTER — Ambulatory Visit: Payer: Self-pay | Admitting: Family Medicine

## 2024-04-09 ENCOUNTER — Ambulatory Visit: Admitting: Family Medicine

## 2024-04-09 VITALS — BP 130/72 | HR 112 | Temp 97.6°F | Ht 71.0 in | Wt 205.6 lb

## 2024-04-09 DIAGNOSIS — D75839 Thrombocytosis, unspecified: Secondary | ICD-10-CM

## 2024-04-09 DIAGNOSIS — J189 Pneumonia, unspecified organism: Secondary | ICD-10-CM | POA: Insufficient documentation

## 2024-04-09 DIAGNOSIS — Z8709 Personal history of other diseases of the respiratory system: Secondary | ICD-10-CM | POA: Insufficient documentation

## 2024-04-09 DIAGNOSIS — Z23 Encounter for immunization: Secondary | ICD-10-CM

## 2024-04-09 LAB — CBC WITH DIFFERENTIAL/PLATELET
Basophils Absolute: 0.1 10*3/uL (ref 0.0–0.1)
Basophils Relative: 1.1 % (ref 0.0–3.0)
Eosinophils Absolute: 0.1 10*3/uL (ref 0.0–0.7)
Eosinophils Relative: 1.3 % (ref 0.0–5.0)
HCT: 41.3 % (ref 39.0–52.0)
Hemoglobin: 14.2 g/dL (ref 13.0–17.0)
Lymphocytes Relative: 25.1 % (ref 12.0–46.0)
Lymphs Abs: 1.8 10*3/uL (ref 0.7–4.0)
MCHC: 34.4 g/dL (ref 30.0–36.0)
MCV: 87.1 fl (ref 78.0–100.0)
Monocytes Absolute: 0.8 10*3/uL (ref 0.1–1.0)
Monocytes Relative: 11.3 % (ref 3.0–12.0)
Neutro Abs: 4.5 10*3/uL (ref 1.4–7.7)
Neutrophils Relative %: 61.2 % (ref 43.0–77.0)
Platelets: 499 10*3/uL — ABNORMAL HIGH (ref 150.0–400.0)
RBC: 4.74 Mil/uL (ref 4.22–5.81)
RDW: 13.6 % (ref 11.5–15.5)
WBC: 7.3 10*3/uL (ref 4.0–10.5)

## 2024-04-09 MED ORDER — BENZONATATE 100 MG PO CAPS: 100.0000 mg | ORAL_CAPSULE | Freq: Two times a day (BID) | ORAL | 0 refills | Status: AC | PRN

## 2024-04-09 NOTE — Progress Notes (Signed)
 " [facility] PRIMARY CARE [facility] PRIMARY [facility] [address] [place] [address] Dept: [phone] Dept Fax: [phone]  Hospital Follow-up Visit  Subjective:    Patient ID: Brad Holmes, male    DOB: 25-Apr-1970, 54 y.o..   MRN: [id number]  Chief Complaint  Patient presents with   Hospitalization Follow-up    Hospital f/u for Pneumonia.  Wants referral to pulmonology.    History of Present Illness:  Patient is in today for hospital follow up from a recent hospitalization for pneumonia. Brad Holmes was seen in an urgent care on 03/25/2024 for shortness of breath, congestion, and productive cough with bloody green-brown mucus. He had been sick for about 9 days with influenza. This had suddenly worsened around the time he presented for care.  His oxygen sat was 86% on room air, improved to 90% with 3L O2 via nasal cannula. He had a near-syncopal episode and was transported to [facility]. Imaging noted no evidence of pulmonary emboli, findings concerning for interstitial/alveolar edema or atypical pneumonia, and trace pleural effusions.He was placed on Rocephin and doxycycline . He was admitted from 1/21-1/24/2026.  Since discharge his respiratory symptoms had improved. However, 2 days ago, he ran a fever of 100.3 F and now has nasal congestion and rhinorrhea.  Past Medical History: Patient Active Problem List   Diagnosis Date Noted   History of TIA (transient ischemic attack) 12/18/2022   Sleeping difficulty 12/18/2022   Class 1 obesity due to excess calories with body mass index (BMI) of 30.0 to 30.9 in adult 12/18/2022   Urethral stricture 08/22/2021   Diverticulosis 12/23/2020   Aortic atherosclerosis 12/23/2020   Adhesive capsulitis of right shoulder 02/05/2020   Abnormal transaminases 08/12/2018   Essential hypertension 08/11/2018   Hyperlipidemia 08/11/2018   Past Surgical History:  Procedure Laterality Date   CARDIAC CATHETERIZATION      05/12/09   CYSTOSCOPY WITH URETHRAL DILATATION N/A 01/13/2021   Procedure: CYSTOSCOPY WITH, BALLOON  URETHRAL DILATATION , BILATERAL RETROGRADES, BLADDER BIOPSIES;  Surgeon: Watt Norleen, MD;  Location: [facility];  Service: Urology;  Laterality: N/A;   EYE SURGERY Left 2017   macular traction surgery   IR 3D INDEPENDENT WKST  11/24/2018   IR ANGIO INTRA EXTRACRAN SEL COM CAROTID INNOMINATE BILAT MOD SED  11/14/2018   IR ANGIO INTRA EXTRACRAN SEL INTERNAL CAROTID UNI R MOD SED  11/24/2018   IR ANGIO VERTEBRAL SEL SUBCLAVIAN INNOMINATE UNI R MOD SED  11/24/2018   IR ANGIO VERTEBRAL SEL VERTEBRAL BILAT MOD SED  11/14/2018   IR NEURO EACH ADD'L AFTER BASIC UNI RIGHT (MS)  11/24/2018   RADIOLOGY WITH ANESTHESIA N/A 11/24/2018   Procedure: RADIOLOGY WITH ANESTHESIA  STENTING;  Surgeon: Dolphus Carrion, MD;  Location: [facility] OR;  Service: Radiology;  Laterality: N/A;   WISDOM TOOTH EXTRACTION     age 22   Family History  Problem Relation Age of Onset   Cancer Mother        Lung   Hyperlipidemia Mother    Heart failure Father    Heart disease Father 98   Heart disease Paternal Uncle    Colon cancer Neg Hx    Colon polyps Neg Hx    Esophageal cancer Neg Hx    Rectal cancer Neg Hx    Stomach cancer Neg Hx    Outpatient Medications Prior to Visit  Medication Sig Dispense Refill   diclofenac  (VOLTAREN ) 75 MG EC tablet Take 1 tablet (75 mg total) by  mouth 2 (two) times daily. 50 tablet 2   losartan -hydrochlorothiazide  (HYZAAR ) 50-12.5 MG tablet Take 1 tablet by mouth daily. 90 tablet 1   PRALUENT  150 MG/ML SOAJ Inject 1 mL (150 mg total) into the skin every 14 (fourteen) days as directed 2 mL 11   rosuvastatin  (CRESTOR ) 40 MG tablet Take 1 tablet (40 mg total) by mouth daily. 90 tablet 3   terbinafine  (LAMISIL ) 250 MG tablet Take 1 tablet (250 mg total) by mouth daily for 7 days and repeat as directed for 3 cycles of  7 day treatments 28 tablet 0   No facility-administered  medications prior to visit.   Allergies[1]   Objective:   Today's Vitals   04/09/24 1050  BP: 130/72  Pulse: (!) 112  Temp: 97.6 F (36.4 C)  TempSrc: Temporal  SpO2: 95%  Weight: 205 lb 9.6 oz (93.3 kg)  Height: 5' 11 (1.803 m)   Body mass index is 28.68 kg/m.   General: Well developed, well nourished. No acute distress. HEENT: Normocephalic, non-traumatic. PERRL, EOMI. Conjunctiva clear. Nose with moderate congestion and rhinorrhea. Mucous   membranes moist. Oropharynx clear. Good dentition. Neck: Supple. No lymphadenopathy. No thyromegaly. Lungs: Mild coarsening in both lung bases.No wheezing. Psych: Alert and oriented. Normal mood and affect.  Health Maintenance Due  Topic Date Due   Hepatitis B Vaccines 19-59 Average Risk (1 of 3 - 19+ 3-dose series) Never done   Zoster Vaccines- Shingrix (1 of 2) Never done   Pneumococcal Vaccine: 50+ Years (1 of 1 - PCV) Never done   COVID-19 Vaccine (2 - Pfizer risk series) 06/18/2019   Influenza Vaccine  10/04/2023   Imaging CT Angio Chest Pulmonary Embolism (03/25/24)  Impression:  1. No evidence of pulmonary emboli. 2. Findings concerning for interstitial/alveolar edema or atypical pneumonia. 3. Trace pleural effusions.  EXAM: XR CHEST 1V (03/25/2024) Impression:  Slight bilateral perihilar and bibasilar patchy infiltrates, possibly reflecting early pulmonary edema or atypical infection.  Lab Results Component Ref Range & Units 2 wk ago  Sodium 136 - 145 mmol/L 137  Potassium 3.4 - 4.5 mmol/L 3.7  Chloride 98 - 107 mmol/L 107  CO2 21 - 31 mmol/L 22  Anion Gap 6 - 14 mmol/L 8  Glucose, Random 70 - 99 mg/dL 99  Blood Urea Nitrogen (BUN) 7 - 25 mg/dL 16  Creatinine 9.29 - 8.69 mg/dL 9.11  eGFR >40 fO/fpw/8.26f7 >90  Albumin 3.5 - 5.7 g/dL 3.2 Low   Total Protein 6.4 - 8.9 g/dL 6.1 Low   Bilirubin, Total 0.3 - 1.0 mg/dL 0.4  Alkaline Phosphatase (ALP) 34 - 104 U/L 101  Aspartate Aminotransferase  (AST) 13 - 39 U/L 59 High   Alanine Aminotransferase (ALT) 7 - 52 U/L 55 High   Calcium  8.6 - 10.3 mg/dL 8.3 Low   Corrected Calcium  mg/dL 8.9   Component Ref Range & Units 2 wk ago  WBC 4.40 - 11.00 10*3/uL 4.36 Low   RBC 4.50 - 5.90 10*6/uL 4.95  Hemoglobin 14.0 - 17.5 g/dL 85.7  Hematocrit 58.4 - 50.4 % 41.4 Low   Mean Corpuscular Volume (MCV) 80.0 - 96.0 fL 83.6  Mean Corpuscular Hemoglobin (MCH) 27.5 - 33.2 pg 28.8  Mean Corpuscular Hemoglobin Conc (MCHC) 33.0 - 37.0 g/dL 65.5  Red Cell Distribution Width (RDW) 12.3 - 17.0 % 13.6  Platelet Count (PLT) 150 - 450 10*3/uL 222  Mean Platelet Volume (MPV) 6.8 - 10.2 fL 7.6  Neutrophils % % 74  Lymphocytes % % 16  Monocytes % % 10  Eosinophils % % 0  Basophils % % 0  Neutrophils Absolute 1.80 - 7.80 10*3/uL 3.20  Lymphocytes # 1.00 - 4.80 10*3/uL 0.70 Low   Monocytes # 0.00 - 0.80 10*3/uL 0.40  Eosinophils # 0.00 - 0.50 10*3/uL 0.00  Basophils # 0.00 - 0.20 10*3/uL 0.00   Assessment & Plan:   Problem List Items Addressed This Visit       Respiratory   Atypical pneumonia - Primary   X-ray and leukopenia with a right shift suggestive of a viral pneumonia. I will repeat his labs today. Based ont he degree of hypoxia he experienced initially, I will have him follow-up with pulmonology.      Relevant Medications   albuterol  (VENTOLIN  HFA) 108 (90 Base) MCG/ACT inhaler   HYDROcodone  bit-homatropine (HYCODAN) 5-1.5 MG/5ML syrup   benzonatate  (TESSALON ) 100 MG capsule   Other Relevant Orders   CBC with Differential/Platelet (Completed)   Ambulatory referral to Pulmonology     Other   History of respiratory failure   Improved.The degree of hypoxia in the hospital raises concern for possible interstitial pneumonitis. I will have him follow-up with pulmonology.      Relevant Orders   Ambulatory referral to Pulmonology   Other Visit Diagnoses       Need for pneumococcal 20-valent conjugate  vaccination       Relevant Orders   Pneumococcal conjugate vaccine 20-valent (Completed)       Return in about 6 weeks (around 05/21/2024) for Reassessment.    Garnette CHRISTELLA Simpler, MD  I,Emily Lagle,acting as a scribe for Garnette CHRISTELLA Simpler, MD.,have documented all relevant documentation on the behalf of Garnette CHRISTELLA Simpler, MD.  I, Garnette CHRISTELLA Simpler, MD, have reviewed all documentation for this visit. The documentation on 04/09/2024 for the exam, diagnosis, procedures, and orders are all accurate and complete.      [1]  Allergies Allergen Reactions   No Known Allergies    "

## 2024-04-09 NOTE — Assessment & Plan Note (Signed)
 Improved.The degree of hypoxia in the hospital raises concern for possible interstitial pneumonitis. I will have him follow-up with pulmonology.

## 2024-04-09 NOTE — Assessment & Plan Note (Signed)
 X-ray and leukopenia with a right shift suggestive of a viral pneumonia. I will repeat his labs today. Based ont he degree of hypoxia he experienced initially, I will have him follow-up with pulmonology.

## 2024-04-13 ENCOUNTER — Ambulatory Visit: Admitting: Internal Medicine

## 2024-05-26 ENCOUNTER — Ambulatory Visit: Admitting: Family Medicine
